# Patient Record
Sex: Male | Born: 1965 | Race: White | Hispanic: No | Marital: Married | Smoking: Never smoker
Health system: Southern US, Community
[De-identification: ages and names within clinical notes are randomized; demographics above are authoritative.]

## PROBLEM LIST (undated history)

## (undated) DIAGNOSIS — K7460 Unspecified cirrhosis of liver: Secondary | ICD-10-CM

## (undated) DIAGNOSIS — I8500 Esophageal varices without bleeding: Secondary | ICD-10-CM

## (undated) DIAGNOSIS — K7030 Alcoholic cirrhosis of liver without ascites: Secondary | ICD-10-CM

## (undated) DIAGNOSIS — G8929 Other chronic pain: Secondary | ICD-10-CM

## (undated) DIAGNOSIS — F319 Bipolar disorder, unspecified: Secondary | ICD-10-CM

## (undated) DIAGNOSIS — K3189 Other diseases of stomach and duodenum: Secondary | ICD-10-CM

## (undated) DIAGNOSIS — F32A Depression, unspecified: Secondary | ICD-10-CM

## (undated) HISTORY — PX: APPENDECTOMY: SHX54

## (undated) HISTORY — DX: Bipolar disorder, unspecified: F31.9

## (undated) HISTORY — DX: Other chronic pain: G89.29

## (undated) HISTORY — DX: Unspecified cirrhosis of liver: K74.60

## (undated) HISTORY — DX: Depression, unspecified: F32.A

---

## 2003-06-04 ENCOUNTER — Emergency Department (HOSPITAL_COMMUNITY): Admission: EM | Admit: 2003-06-04 | Discharge: 2003-06-04 | Payer: Self-pay

## 2006-11-08 ENCOUNTER — Encounter: Payer: Self-pay | Admitting: Internal Medicine

## 2006-11-18 ENCOUNTER — Ambulatory Visit (HOSPITAL_BASED_OUTPATIENT_CLINIC_OR_DEPARTMENT_OTHER): Admission: RE | Admit: 2006-11-18 | Discharge: 2006-11-18 | Payer: Self-pay | Admitting: *Deleted

## 2006-11-25 ENCOUNTER — Ambulatory Visit: Payer: Self-pay | Admitting: Internal Medicine

## 2007-12-03 ENCOUNTER — Ambulatory Visit: Payer: Self-pay | Admitting: Internal Medicine

## 2007-12-03 DIAGNOSIS — G4730 Sleep apnea, unspecified: Secondary | ICD-10-CM | POA: Insufficient documentation

## 2007-12-03 DIAGNOSIS — G4733 Obstructive sleep apnea (adult) (pediatric): Secondary | ICD-10-CM | POA: Insufficient documentation

## 2007-12-21 ENCOUNTER — Encounter: Payer: Self-pay | Admitting: Internal Medicine

## 2007-12-22 ENCOUNTER — Encounter: Payer: Self-pay | Admitting: Internal Medicine

## 2007-12-29 ENCOUNTER — Encounter: Payer: Self-pay | Admitting: Internal Medicine

## 2008-01-09 ENCOUNTER — Ambulatory Visit: Payer: Self-pay | Admitting: Internal Medicine

## 2008-06-23 ENCOUNTER — Ambulatory Visit: Payer: Self-pay | Admitting: Internal Medicine

## 2010-07-23 ENCOUNTER — Encounter: Payer: Self-pay | Admitting: Sports Medicine

## 2010-11-14 NOTE — Procedures (Signed)
NAMESAAFIR, Benjamin Knight              ACCOUNT NO.:  [id number]   MEDICAL RECORD NO.:  [id number]          PATIENT TYPE:  OUT   LOCATION:  SLEEP CENTER                 FACILITY:  [facility]   PHYSICIAN:  Clinton D. Maple Hudson, MD, FCCP, FACPDATE OF BIRTH:  Feb 15[birth year] 1965   DATE OF STUDY:  11/18/2006                            NOCTURNAL POLYSOMNOGRAM   REFERRING PHYSICIAN:  Donzetta Sprung   INDICATION FOR STUDY:  Insomnia with sleep apnea.   EPWORTH SLEEPINESS SCORE:  21/24.   BMI:  32.   WEIGHT:  218 pounds.   MEDICATIONS:  No medications listed.   SLEEP ARCHITECTURE:  Total sleep time 300 minutes with sleep efficiency  80%.  Stage I was 7%, stage II 76%, stages III and IV 6%.  REM 12% of  total sleep time.  Sleep latency 10 minutes.  REM latency 82 minutes.  Awake after sleep onset 20 minutes.  Arousable index 13.  No bedtime  medication taken.   RESPIRATORY DATA:  Apnea hypopnea index (AHI, RDI) 13.4 obstructive  events per hour indicating mild obstructive sleep apnea/hypopnea  syndrome.  There were 12 obstructive apneas and 55 hypopneas.  Events  were not positional.  REM, AHI 32 per hour.  There were insufficient  early events to qualify for CPAP titration by split protocol on this  study night.   OXYGEN DATA:  Mild snoring with oxygen desaturation to a nadir of 83%.  Main oxygen saturation through the study was 91% on room air.   CARDIAC DATA:  Normal sinus rhythm.   MOVEMENT-PARASOMNIA:  Rare limb jerk, insignificant.  Bathroom x 1.   IMPRESSIONS-RECOMMENDATIONS:  1. Mild obstructive sleep apnea/hypopnea syndrome, apnea hypopnea      index 13.4 per hour with none positional events, mild snoring, and      oxygen desaturation to a nadir of 84%.  2. There were insufficient events in the first half of the night to      qualify for continuous positive airway pressure titration by split      protocol on this study night.  Scores between 8 and 15 are      considered borderline as  indicators      for continuous positive airway pressure therapy, which may be      justified if more conservative measures are ineffective.      Clinton D. Maple Hudson, MD, [facility], FACP  Diplomate, Biomedical engineer of Sleep Medicine  Electronically Signed     CDY/MEDQ  D:  11/25/2006 09:03:45  T:  11/25/2006 04:54:09  Job:  [id number]

## 2016-07-18 DIAGNOSIS — M79672 Pain in left foot: Secondary | ICD-10-CM | POA: Diagnosis not present

## 2016-11-02 DIAGNOSIS — K900 Celiac disease: Secondary | ICD-10-CM | POA: Diagnosis not present

## 2016-11-19 ENCOUNTER — Ambulatory Visit
Admission: RE | Admit: 2016-11-19 | Discharge: 2016-11-19 | Disposition: A | Discharge status: Home / Self Care | Payer: 59 | Source: Ambulatory Visit | Attending: Student | Admitting: Student

## 2016-11-19 ENCOUNTER — Other Ambulatory Visit: Payer: Self-pay | Admitting: Student

## 2016-11-19 DIAGNOSIS — M79661 Pain in right lower leg: Secondary | ICD-10-CM

## 2016-11-19 DIAGNOSIS — L03115 Cellulitis of right lower limb: Secondary | ICD-10-CM | POA: Diagnosis not present

## 2017-02-22 DIAGNOSIS — Z0000 Encounter for general adult medical examination without abnormal findings: Secondary | ICD-10-CM | POA: Diagnosis not present

## 2017-03-01 DIAGNOSIS — Z0001 Encounter for general adult medical examination with abnormal findings: Secondary | ICD-10-CM | POA: Diagnosis not present

## 2017-04-29 DIAGNOSIS — L03315 Cellulitis of perineum: Secondary | ICD-10-CM | POA: Diagnosis not present

## 2017-05-03 DIAGNOSIS — L03315 Cellulitis of perineum: Secondary | ICD-10-CM | POA: Diagnosis not present

## 2018-02-28 DIAGNOSIS — Z0001 Encounter for general adult medical examination with abnormal findings: Secondary | ICD-10-CM | POA: Diagnosis not present

## 2018-03-07 DIAGNOSIS — Z0001 Encounter for general adult medical examination with abnormal findings: Secondary | ICD-10-CM | POA: Diagnosis not present

## 2018-03-07 DIAGNOSIS — Z6830 Body mass index (BMI) 30.0-30.9, adult: Secondary | ICD-10-CM | POA: Diagnosis not present

## 2020-01-07 DIAGNOSIS — Z6829 Body mass index (BMI) 29.0-29.9, adult: Secondary | ICD-10-CM | POA: Diagnosis not present

## 2020-01-07 DIAGNOSIS — K2100 Gastro-esophageal reflux disease with esophagitis, without bleeding: Secondary | ICD-10-CM | POA: Diagnosis not present

## 2020-01-07 DIAGNOSIS — F331 Major depressive disorder, recurrent, moderate: Secondary | ICD-10-CM | POA: Diagnosis not present

## 2020-02-25 DIAGNOSIS — F331 Major depressive disorder, recurrent, moderate: Secondary | ICD-10-CM | POA: Diagnosis not present

## 2020-02-25 DIAGNOSIS — G4733 Obstructive sleep apnea (adult) (pediatric): Secondary | ICD-10-CM | POA: Diagnosis not present

## 2020-02-25 DIAGNOSIS — Z6829 Body mass index (BMI) 29.0-29.9, adult: Secondary | ICD-10-CM | POA: Diagnosis not present

## 2020-02-25 DIAGNOSIS — K2100 Gastro-esophageal reflux disease with esophagitis, without bleeding: Secondary | ICD-10-CM | POA: Diagnosis not present

## 2020-05-20 DIAGNOSIS — M7989 Other specified soft tissue disorders: Secondary | ICD-10-CM | POA: Diagnosis not present

## 2020-05-20 DIAGNOSIS — J302 Other seasonal allergic rhinitis: Secondary | ICD-10-CM | POA: Diagnosis not present

## 2020-05-20 DIAGNOSIS — R251 Tremor, unspecified: Secondary | ICD-10-CM | POA: Diagnosis not present

## 2020-07-08 DIAGNOSIS — Z13220 Encounter for screening for lipoid disorders: Secondary | ICD-10-CM | POA: Diagnosis not present

## 2020-07-08 DIAGNOSIS — Z7185 Encounter for immunization safety counseling: Secondary | ICD-10-CM | POA: Diagnosis not present

## 2020-07-08 DIAGNOSIS — Z1211 Encounter for screening for malignant neoplasm of colon: Secondary | ICD-10-CM | POA: Diagnosis not present

## 2020-07-08 DIAGNOSIS — E559 Vitamin D deficiency, unspecified: Secondary | ICD-10-CM | POA: Diagnosis not present

## 2020-07-08 DIAGNOSIS — Z0000 Encounter for general adult medical examination without abnormal findings: Secondary | ICD-10-CM | POA: Diagnosis not present

## 2020-07-08 DIAGNOSIS — Z125 Encounter for screening for malignant neoplasm of prostate: Secondary | ICD-10-CM | POA: Diagnosis not present

## 2020-07-08 DIAGNOSIS — F419 Anxiety disorder, unspecified: Secondary | ICD-10-CM | POA: Diagnosis not present

## 2020-07-08 DIAGNOSIS — R748 Abnormal levels of other serum enzymes: Secondary | ICD-10-CM | POA: Diagnosis not present

## 2020-07-08 DIAGNOSIS — K900 Celiac disease: Secondary | ICD-10-CM | POA: Diagnosis not present

## 2020-07-15 DIAGNOSIS — Z20822 Contact with and (suspected) exposure to covid-19: Secondary | ICD-10-CM | POA: Diagnosis not present

## 2020-07-19 DIAGNOSIS — N3000 Acute cystitis without hematuria: Secondary | ICD-10-CM | POA: Diagnosis not present

## 2020-09-02 DIAGNOSIS — F419 Anxiety disorder, unspecified: Secondary | ICD-10-CM | POA: Diagnosis not present

## 2020-09-23 DIAGNOSIS — Z1379 Encounter for other screening for genetic and chromosomal anomalies: Secondary | ICD-10-CM | POA: Insufficient documentation

## 2020-09-28 DIAGNOSIS — Z01818 Encounter for other preprocedural examination: Secondary | ICD-10-CM | POA: Diagnosis not present

## 2020-09-28 DIAGNOSIS — K900 Celiac disease: Secondary | ICD-10-CM | POA: Diagnosis not present

## 2020-10-13 DIAGNOSIS — R768 Other specified abnormal immunological findings in serum: Secondary | ICD-10-CM | POA: Diagnosis not present

## 2020-10-13 DIAGNOSIS — D124 Benign neoplasm of descending colon: Secondary | ICD-10-CM | POA: Diagnosis not present

## 2020-10-13 DIAGNOSIS — K2970 Gastritis, unspecified, without bleeding: Secondary | ICD-10-CM | POA: Diagnosis not present

## 2020-10-13 DIAGNOSIS — K3189 Other diseases of stomach and duodenum: Secondary | ICD-10-CM | POA: Diagnosis not present

## 2020-10-13 DIAGNOSIS — Z1211 Encounter for screening for malignant neoplasm of colon: Secondary | ICD-10-CM | POA: Diagnosis not present

## 2020-10-13 DIAGNOSIS — K900 Celiac disease: Secondary | ICD-10-CM | POA: Diagnosis not present

## 2021-02-17 DIAGNOSIS — Z20822 Contact with and (suspected) exposure to covid-19: Secondary | ICD-10-CM | POA: Diagnosis not present

## 2021-03-17 DIAGNOSIS — G4700 Insomnia, unspecified: Secondary | ICD-10-CM | POA: Diagnosis not present

## 2021-03-17 DIAGNOSIS — F3181 Bipolar II disorder: Secondary | ICD-10-CM | POA: Diagnosis not present

## 2021-03-17 DIAGNOSIS — F1099 Alcohol use, unspecified with unspecified alcohol-induced disorder: Secondary | ICD-10-CM | POA: Diagnosis not present

## 2021-03-29 DIAGNOSIS — F10982 Alcohol use, unspecified with alcohol-induced sleep disorder: Secondary | ICD-10-CM | POA: Diagnosis not present

## 2021-03-29 DIAGNOSIS — M9903 Segmental and somatic dysfunction of lumbar region: Secondary | ICD-10-CM | POA: Diagnosis not present

## 2021-03-29 DIAGNOSIS — F3181 Bipolar II disorder: Secondary | ICD-10-CM | POA: Diagnosis not present

## 2021-03-29 DIAGNOSIS — M9902 Segmental and somatic dysfunction of thoracic region: Secondary | ICD-10-CM | POA: Diagnosis not present

## 2021-03-29 DIAGNOSIS — M62830 Muscle spasm of back: Secondary | ICD-10-CM | POA: Diagnosis not present

## 2021-03-29 DIAGNOSIS — R251 Tremor, unspecified: Secondary | ICD-10-CM | POA: Diagnosis not present

## 2021-03-29 DIAGNOSIS — M546 Pain in thoracic spine: Secondary | ICD-10-CM | POA: Diagnosis not present

## 2021-03-30 ENCOUNTER — Inpatient Hospital Stay (HOSPITAL_COMMUNITY)
Admission: EM | Admit: 2021-03-30 | Discharge: 2021-04-02 | DRG: 433 | Disposition: A | Discharge status: Home / Self Care | Payer: BC Managed Care – PPO | Attending: Internal Medicine | Admitting: Internal Medicine

## 2021-03-30 ENCOUNTER — Observation Stay (HOSPITAL_BASED_OUTPATIENT_CLINIC_OR_DEPARTMENT_OTHER): Payer: BC Managed Care – PPO

## 2021-03-30 ENCOUNTER — Emergency Department (HOSPITAL_COMMUNITY): Payer: BC Managed Care – PPO

## 2021-03-30 ENCOUNTER — Observation Stay (HOSPITAL_COMMUNITY): Payer: BC Managed Care – PPO

## 2021-03-30 ENCOUNTER — Encounter (HOSPITAL_COMMUNITY): Payer: Self-pay | Admitting: Emergency Medicine

## 2021-03-30 DIAGNOSIS — R791 Abnormal coagulation profile: Secondary | ICD-10-CM | POA: Diagnosis not present

## 2021-03-30 DIAGNOSIS — Z79899 Other long term (current) drug therapy: Secondary | ICD-10-CM

## 2021-03-30 DIAGNOSIS — Z20822 Contact with and (suspected) exposure to covid-19: Secondary | ICD-10-CM | POA: Diagnosis not present

## 2021-03-30 DIAGNOSIS — M549 Dorsalgia, unspecified: Secondary | ICD-10-CM | POA: Diagnosis not present

## 2021-03-30 DIAGNOSIS — R823 Hemoglobinuria: Secondary | ICD-10-CM | POA: Diagnosis present

## 2021-03-30 DIAGNOSIS — R29818 Other symptoms and signs involving the nervous system: Secondary | ICD-10-CM | POA: Diagnosis not present

## 2021-03-30 DIAGNOSIS — R278 Other lack of coordination: Secondary | ICD-10-CM | POA: Diagnosis present

## 2021-03-30 DIAGNOSIS — F1090 Alcohol use, unspecified, uncomplicated: Secondary | ICD-10-CM | POA: Diagnosis present

## 2021-03-30 DIAGNOSIS — I428 Other cardiomyopathies: Secondary | ICD-10-CM

## 2021-03-30 DIAGNOSIS — Z6823 Body mass index (BMI) 23.0-23.9, adult: Secondary | ICD-10-CM | POA: Diagnosis not present

## 2021-03-30 DIAGNOSIS — R9431 Abnormal electrocardiogram [ECG] [EKG]: Secondary | ICD-10-CM | POA: Diagnosis not present

## 2021-03-30 DIAGNOSIS — G4730 Sleep apnea, unspecified: Secondary | ICD-10-CM | POA: Diagnosis present

## 2021-03-30 DIAGNOSIS — G8929 Other chronic pain: Secondary | ICD-10-CM | POA: Diagnosis not present

## 2021-03-30 DIAGNOSIS — I444 Left anterior fascicular block: Secondary | ICD-10-CM | POA: Diagnosis present

## 2021-03-30 DIAGNOSIS — K7040 Alcoholic hepatic failure without coma: Secondary | ICD-10-CM | POA: Diagnosis not present

## 2021-03-30 DIAGNOSIS — F17220 Nicotine dependence, chewing tobacco, uncomplicated: Secondary | ICD-10-CM | POA: Diagnosis not present

## 2021-03-30 DIAGNOSIS — G9340 Encephalopathy, unspecified: Secondary | ICD-10-CM | POA: Diagnosis not present

## 2021-03-30 DIAGNOSIS — F319 Bipolar disorder, unspecified: Secondary | ICD-10-CM | POA: Diagnosis not present

## 2021-03-30 DIAGNOSIS — D649 Anemia, unspecified: Secondary | ICD-10-CM | POA: Diagnosis present

## 2021-03-30 DIAGNOSIS — Y900 Blood alcohol level of less than 20 mg/100 ml: Secondary | ICD-10-CM | POA: Diagnosis present

## 2021-03-30 DIAGNOSIS — R4182 Altered mental status, unspecified: Secondary | ICD-10-CM | POA: Diagnosis present

## 2021-03-30 DIAGNOSIS — F10288 Alcohol dependence with other alcohol-induced disorder: Secondary | ICD-10-CM | POA: Diagnosis not present

## 2021-03-30 DIAGNOSIS — R634 Abnormal weight loss: Secondary | ICD-10-CM | POA: Diagnosis not present

## 2021-03-30 DIAGNOSIS — K7682 Hepatic encephalopathy: Secondary | ICD-10-CM

## 2021-03-30 DIAGNOSIS — I959 Hypotension, unspecified: Secondary | ICD-10-CM | POA: Diagnosis not present

## 2021-03-30 DIAGNOSIS — K7030 Alcoholic cirrhosis of liver without ascites: Secondary | ICD-10-CM | POA: Diagnosis present

## 2021-03-30 DIAGNOSIS — K8020 Calculus of gallbladder without cholecystitis without obstruction: Secondary | ICD-10-CM | POA: Diagnosis not present

## 2021-03-30 DIAGNOSIS — K900 Celiac disease: Secondary | ICD-10-CM | POA: Diagnosis present

## 2021-03-30 LAB — CBC WITH DIFFERENTIAL/PLATELET
Abs Immature Granulocytes: 0.02 10*3/uL (ref 0.00–0.07)
Basophils Absolute: 0.1 10*3/uL (ref 0.0–0.1)
Basophils Relative: 2 %
Eosinophils Absolute: 0.3 10*3/uL (ref 0.0–0.5)
Eosinophils Relative: 3 %
HCT: 36.5 % — ABNORMAL LOW (ref 39.0–52.0)
Hemoglobin: 11.4 g/dL — ABNORMAL LOW (ref 13.0–17.0)
Immature Granulocytes: 0 %
Lymphocytes Relative: 22 %
Lymphs Abs: 1.7 10*3/uL (ref 0.7–4.0)
MCH: 26.6 pg (ref 26.0–34.0)
MCHC: 31.2 g/dL (ref 30.0–36.0)
MCV: 85.3 fL (ref 80.0–100.0)
Monocytes Absolute: 1.9 10*3/uL — ABNORMAL HIGH (ref 0.1–1.0)
Monocytes Relative: 23 %
Neutro Abs: 4.1 10*3/uL (ref 1.7–7.7)
Neutrophils Relative %: 50 %
Platelets: 210 10*3/uL (ref 150–400)
RBC: 4.28 MIL/uL (ref 4.22–5.81)
RDW: 19.1 % — ABNORMAL HIGH (ref 11.5–15.5)
WBC: 8.1 10*3/uL (ref 4.0–10.5)
nRBC: 0 % (ref 0.0–0.2)

## 2021-03-30 LAB — COMPREHENSIVE METABOLIC PANEL WITH GFR
ALT: 23 U/L (ref 0–44)
AST: 79 U/L — ABNORMAL HIGH (ref 15–41)
Albumin: 3 g/dL — ABNORMAL LOW (ref 3.5–5.0)
Alkaline Phosphatase: 158 U/L — ABNORMAL HIGH (ref 38–126)
Anion gap: 9 (ref 5–15)
BUN: 11 mg/dL (ref 6–20)
CO2: 23 mmol/L (ref 22–32)
Calcium: 9.1 mg/dL (ref 8.9–10.3)
Chloride: 107 mmol/L (ref 98–111)
Creatinine, Ser: 0.82 mg/dL (ref 0.61–1.24)
GFR, Estimated: >60 mL/min
Glucose, Bld: 135 mg/dL — ABNORMAL HIGH (ref 70–99)
Potassium: 4.3 mmol/L (ref 3.5–5.1)
Sodium: 139 mmol/L (ref 135–145)
Total Bilirubin: 1.9 mg/dL — ABNORMAL HIGH (ref 0.3–1.2)
Total Protein: 7.1 g/dL (ref 6.5–8.1)

## 2021-03-30 LAB — I-STAT CHEM 8, ED
BUN: 14 mg/dL (ref 6–20)
Calcium, Ion: 1.17 mmol/L (ref 1.15–1.40)
Chloride: 106 mmol/L (ref 98–111)
Creatinine, Ser: 0.8 mg/dL (ref 0.61–1.24)
Glucose, Bld: 129 mg/dL — ABNORMAL HIGH (ref 70–99)
HCT: 34 % — ABNORMAL LOW (ref 39.0–52.0)
Hemoglobin: 11.6 g/dL — ABNORMAL LOW (ref 13.0–17.0)
Potassium: 4.3 mmol/L (ref 3.5–5.1)
Sodium: 141 mmol/L (ref 135–145)
TCO2: 25 mmol/L (ref 22–32)

## 2021-03-30 LAB — ECHOCARDIOGRAM COMPLETE
AR max vel: 2.63 cm2
AV Area VTI: 2.23 cm2
AV Area mean vel: 3.16 cm2
AV Mean grad: 7 mmHg
AV Peak grad: 12.7 mmHg
Ao pk vel: 1.78 m/s
Area-P 1/2: 2.42 cm2
Calc EF: 57.8 %
S' Lateral: 3.2 cm
Single Plane A2C EF: 63.1 %
Single Plane A4C EF: 57 %

## 2021-03-30 LAB — URINALYSIS, ROUTINE W REFLEX MICROSCOPIC
Bacteria, UA: NONE SEEN
Bilirubin Urine: NEGATIVE
Glucose, UA: NEGATIVE mg/dL
Ketones, ur: NEGATIVE mg/dL
Leukocytes,Ua: NEGATIVE
Nitrite: NEGATIVE
Protein, ur: NEGATIVE mg/dL
Specific Gravity, Urine: 1.01 (ref 1.005–1.030)
pH: 7 (ref 5.0–8.0)

## 2021-03-30 LAB — CBG MONITORING, ED: Glucose-Capillary: 127 mg/dL — ABNORMAL HIGH (ref 70–99)

## 2021-03-30 LAB — ETHANOL: Alcohol, Ethyl (B): <10 mg/dL

## 2021-03-30 LAB — RAPID URINE DRUG SCREEN, HOSP PERFORMED
Amphetamines: NOT DETECTED
Barbiturates: NOT DETECTED
Benzodiazepines: NOT DETECTED
Cocaine: NOT DETECTED
Opiates: NOT DETECTED
Tetrahydrocannabinol: NOT DETECTED

## 2021-03-30 LAB — ACETAMINOPHEN LEVEL: Acetaminophen (Tylenol), Serum: <10 ug/mL — ABNORMAL LOW (ref 10–30)

## 2021-03-30 LAB — SALICYLATE LEVEL: Salicylate Lvl: <7 mg/dL — ABNORMAL LOW (ref 7.0–30.0)

## 2021-03-30 LAB — AMMONIA: Ammonia: 93 umol/L — ABNORMAL HIGH (ref 9–35)

## 2021-03-30 LAB — LACTIC ACID, PLASMA: Lactic Acid, Venous: 1.6 mmol/L (ref 0.5–1.9)

## 2021-03-30 LAB — VALPROIC ACID LEVEL: Valproic Acid Lvl: 48 ug/mL — ABNORMAL LOW (ref 50.0–100.0)

## 2021-03-30 LAB — RESP PANEL BY RT-PCR (FLU A&B, COVID) ARPGX2
Influenza A by PCR: NEGATIVE
Influenza B by PCR: NEGATIVE
SARS Coronavirus 2 by RT PCR: NEGATIVE

## 2021-03-30 MED ORDER — RIVAROXABAN 10 MG PO TABS
10.0000 mg | ORAL_TABLET | Freq: Every day | ORAL | Status: DC
  Administered 2021-03-30 – 2021-04-02 (×4): 10 mg via ORAL
  Filled 2021-03-30 (×4): qty 1

## 2021-03-30 MED ORDER — PANTOPRAZOLE SODIUM 40 MG PO TBEC
40.0000 mg | DELAYED_RELEASE_TABLET | Freq: Every day | ORAL | Status: DC
  Administered 2021-03-30 – 2021-04-02 (×4): 40 mg via ORAL
  Filled 2021-03-30 (×4): qty 1

## 2021-03-30 MED ORDER — LACTULOSE 10 GM/15ML PO SOLN
30.0000 g | Freq: Once | ORAL | Status: AC
  Administered 2021-03-30: 30 g via ORAL
  Filled 2021-03-30: qty 45

## 2021-03-30 MED ORDER — DIVALPROEX SODIUM 250 MG PO DR TAB
500.0000 mg | DELAYED_RELEASE_TABLET | Freq: Every day | ORAL | Status: DC
  Administered 2021-03-30: 500 mg via ORAL
  Filled 2021-03-30: qty 2

## 2021-03-30 MED ORDER — SODIUM CHLORIDE 0.9% FLUSH
3.0000 mL | Freq: Two times a day (BID) | INTRAVENOUS | Status: DC
  Administered 2021-03-30 – 2021-04-02 (×7): 3 mL via INTRAVENOUS

## 2021-03-30 MED ORDER — FUROSEMIDE 10 MG/ML IJ SOLN
40.0000 mg | Freq: Once | INTRAMUSCULAR | Status: AC
  Administered 2021-03-30: 40 mg via INTRAVENOUS
  Filled 2021-03-30: qty 4

## 2021-03-30 MED ORDER — ESCITALOPRAM OXALATE 10 MG PO TABS
5.0000 mg | ORAL_TABLET | Freq: Every morning | ORAL | Status: DC
  Administered 2021-03-31 – 2021-04-02 (×3): 5 mg via ORAL
  Filled 2021-03-30 (×3): qty 1

## 2021-03-30 MED ORDER — IBUPROFEN 400 MG PO TABS: 400.0000 mg | ORAL_TABLET | Freq: Four times a day (QID) | ORAL | Status: DC | PRN

## 2021-03-30 MED ORDER — PROPRANOLOL HCL 10 MG PO TABS
10.0000 mg | ORAL_TABLET | Freq: Two times a day (BID) | ORAL | Status: DC
  Administered 2021-03-30 – 2021-04-01 (×3): 10 mg via ORAL
  Filled 2021-03-30 (×7): qty 1

## 2021-03-30 MED ORDER — LACTULOSE 10 GM/15ML PO SOLN
30.0000 g | Freq: Three times a day (TID) | ORAL | Status: DC
  Administered 2021-03-30 – 2021-04-01 (×6): 30 g via ORAL
  Filled 2021-03-30 (×2): qty 45
  Filled 2021-03-30: qty 60
  Filled 2021-03-30: qty 45
  Filled 2021-03-30: qty 60
  Filled 2021-03-30 (×3): qty 45

## 2021-03-30 NOTE — ED Provider Notes (Addendum)
[facility] EMERGENCY DEPARTMENT Provider Note   CSN: [id number] Arrival date & time: 03/30/21  0755     History Chief Complaint  Patient presents with   Altered Mental Status    Benjamin Knight is a 55 y.o. male with PMH significant for bipolar disorder who was recently started on depakote who was brought in by his wife for AMS. Wife reports he started acting not himself at around 8pm last night, but became more decidedly altered this morning.  Reports that he believes that he started having mood changes around 6 months ago.  Initially she thought the mood changes were secondary to some sort of "midlife crisis" however patient went to see psychiatrist a few weeks before Labor Day, was diagnosed with bipolar disorder.  Patient was started on Depakote within the last month, as well as escitalopram around the same time.  Wife endorses that patient has been having tremors, feeling cold consistently for several months, prior to beginning these new medications.  His wife does endorse that he was drinking heavily, perhaps 1/5 a day before she confronted him around Labor Day and encouraged him to quit.  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  Patient has not had withdrawal from alcohol previously, history of seizures with alcohol.  His wife does endorse that he had some suicidal ideation around a month ago, however he denies overdose on medication alcohol today.  Patient's odd behavior has been noticed at work and he has been flat affect, and unengaged for the last month.  Acute worsening of mental status changes this morning, patient was found trying to discharge his truck with no keys, having cracked some eggs onto the floor, and attempted to urinate into the sink.  His wife also endorses 40 pound weight loss in the last year.  Level 5 caveat: altered mental status   Altered Mental Status     History reviewed. No pertinent past medical history.  Patient  Active Problem List   Diagnosis Date Noted   AMS (altered mental status) 03/30/2021   SLEEP APNEA 12/03/2007    History reviewed. No pertinent surgical history.     No family history on file.     Home Medications Prior to Admission medications   Medication Sig Start Date End Date Taking? Authorizing Provider  divalproex (DEPAKOTE) 500 MG DR tablet Take 500 mg by mouth daily after supper. 03/17/21  Yes [provider]  escitalopram (LEXAPRO) 10 MG tablet Take 5 mg by mouth in the morning. 02/08/21  Yes [provider]  PEPCID 20 MG tablet Take 10-20 mg by mouth 2 (two) times daily as needed for heartburn or indigestion.   Yes [provider]  propranolol (INDERAL) 10 MG tablet Take 10 mg by mouth 2 (two) times daily. 03/29/21  Yes [provider]    Allergies    Patient has no known allergies.  Review of Systems   Review of Systems  Reason unable to perform ROS: AMS.   Physical Exam Updated Vital Signs BP 121/79   Pulse 71   Temp (!) 97.3 F (36.3 C) (Oral)   Resp 13   SpO2 95%   Physical Exam Vitals and nursing note reviewed.  Constitutional:      General: He is not in acute distress.    Appearance: Normal appearance.  HENT:     Head: Normocephalic and atraumatic.  Eyes:     General: Scleral icterus present.          Right eye: No discharge.        Left eye: No discharge.     Comments: Mild scleral icterus  Cardiovascular:     Rate and Rhythm: Normal rate and regular rhythm.     Heart sounds: No murmur heard.   No friction rub. No gallop.  Pulmonary:     Effort: Pulmonary effort is normal.     Breath sounds: Normal breath sounds.  Abdominal:     General: Bowel sounds are normal.     Palpations: Abdomen is soft.     Comments: Does not respond to abdominal palpation, denies pain when asked.  No masses were palpated.  Bowel sounds are normal  Skin:    General: Skin is warm and dry.     Capillary Refill: Capillary refill takes  less than 2 seconds.     Comments: Bilateral lower extremity pitting edema 1-2+, symmetrical.  No evidence of ulceration, or infection.  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  Neurological:     Mental Status: He is alert.     Comments: Patient is oriented to self, and place, not oriented to date. Cranial nerves III through XII grossly intact.  5 out of 5 strength in upper and lower extremities, symmetrical.  Intact finger-to-nose with some tremor.  Patient has difficulty with memory, could not describe the events of this morning.  Patient additionally was unable to count backwards from 100 by sevens.  Psychiatric:     Comments: Thought content abnormal, patient confused and somnolent    ED Results / Procedures / Treatments   Labs (all labs ordered are listed, but only abnormal results are displayed) Labs Reviewed  CBC WITH DIFFERENTIAL/PLATELET - Abnormal; Notable for the following components:      Result Value   Hemoglobin 11.4 (*)    HCT 36.5 (*)    RDW 19.1 (*)    Monocytes Absolute 1.9 (*)    All other components within normal limits  COMPREHENSIVE METABOLIC PANEL - Abnormal; Notable for the following components:   Glucose, Bld 135 (*)    Albumin 3.0 (*)    AST 79 (*)    Alkaline Phosphatase 158 (*)    Total Bilirubin 1.9 (*)    All other components within normal limits  URINALYSIS, ROUTINE W REFLEX MICROSCOPIC - Abnormal; Notable for the following components:   Hgb urine dipstick MODERATE (*)    Non Squamous Epithelial 0-5 (*)    All other components within normal limits  AMMONIA - Abnormal; Notable for the following components:   Ammonia 93 (*)    All other components within normal limits  VALPROIC ACID LEVEL - Abnormal; Notable for the following components:   Valproic Acid Lvl 48 (*)    All other components within normal limits  ACETAMINOPHEN LEVEL - Abnormal; Notable for the following components:    Acetaminophen (Tylenol), Serum <10 (*)    All other components within normal limits  SALICYLATE LEVEL - Abnormal; Notable for the following components:   Salicylate Lvl <7.0 (*)    All other components within normal limits  CBG MONITORING, ED - Abnormal; Notable for the following components:   Glucose-Capillary 127 (*)    All other components within normal limits  I-STAT CHEM 8, ED - Abnormal; Notable for the following components:   Glucose, Bld 129 (*)    Hemoglobin 11.6 (*)    HCT 34.0 (*)    All other components  within normal limits  RESP PANEL BY RT-PCR (FLU A&B, COVID) ARPGX2  LACTIC ACID, PLASMA  ETHANOL  RAPID URINE DRUG SCREEN, HOSP PERFORMED  BILIRUBIN, FRACTIONATED(TOT/DIR/INDIR)    EKG EKG Interpretation  Date/Time:  Thursday March 30 2021 07:56:25 EDT Ventricular Rate:  69 PR Interval:  130 QRS Duration: 94 QT Interval:  424 QTC Calculation: 454 R Axis:   -52 Text Interpretation: Normal sinus rhythm Left anterior fascicular block Abnormal ECG No significant change since last tracing Confirmed by Schlossman, Erin ([id number]) on 03/30/2021 9:03:04 AM  Radiology DG Chest 2 View  Result Date: 03/30/2021 CLINICAL DATA:  Altered mental status EXAM: CHEST - 2 VIEW COMPARISON:  None. FINDINGS: Criteria and mediastinal contours within normal limits for AP technique. Lungs are clear. Pleural effusion or pneumothorax. IMPRESSION: No active cardiopulmonary disease. Electronically Signed   By: Leah  Strickland M.D.   On: 03/30/2021 08:58   CT Head Wo Contrast  Result Date: 03/30/2021 CLINICAL DATA:  Mental status changes EXAM: CT HEAD WITHOUT CONTRAST TECHNIQUE: Contiguous axial images were obtained from the base of the skull through the vertex without intravenous contrast. COMPARISON:  None. FINDINGS: Brain: No acute intracranial abnormality. Specifically, no hemorrhage, hydrocephalus, mass lesion, acute infarction, or significant intracranial injury. Vascular: No hyperdense  vessel or unexpected calcification. Skull: No acute calvarial abnormality. Sinuses/Orbits: No acute findings Other: None IMPRESSION: No acute intracranial abnormality. Electronically Signed   By: Kevin  Dover M.D.   On: 03/30/2021 09:21    Procedures Procedures   Medications Ordered in ED Medications  rivaroxaban (XARELTO) tablet 10 mg (has no administration in time range)  sodium chloride flush (NS) 0.9 % injection 3 mL (has no administration in time range)  ibuprofen (ADVIL) tablet 400 mg (has no administration in time range)  lactulose (CHRONULAC) 10 GM/15ML solution 30 g (30 g Oral Given 03/30/21 1018)    ED Course  I have reviewed the triage vital signs and the nursing notes.  Pertinent labs & imaging results that were available during my care of the patient were reviewed by me and considered in my medical decision making (see chart for details).    MDM Rules/Calculators/A&P                         Lab work-up is remarkable for normal head CT, normal chest x-ray, and EKG.  Patient has an ammonia level of 93, slightly elevated alk phos and T bili as well as AST.  CBG is normal.  At this time we will order valproic acid level, as well as other toxic ingestion labs.  We will obtain an ultrasound of the liver.  We will begin lactulose.  Patient with neurologic changes consistent with hepatic encephalopathy versus Depakote toxicity.  Will consult with hospitalist for admission.  Pending ingestion labs secondary to report that patient has been struggling with some suicidal ideation last month.  No remarkable EKG changes.  Patient currently protecting his airway.  Hospitalist agrees to admission.  Patient stable at time of handoff.  Worsening of mental status throughout hospital course so far, patient was oriented to date during triage.  Final Clinical Impression(s) / ED Diagnoses Final diagnoses:  Altered mental status    Rx / DC Orders ED Discharge Orders     None         Prosperi, Christian H, PA-C 03/30/21 1224    Prosperi, Christian H, PA-C 03/30/21 1244    Schlossman, Erin, MD 03/30/21 1254  

## 2021-03-30 NOTE — ED Triage Notes (Signed)
Pt arrives with wife who reports pt has been altered since last night around 8 pm. Pt able to answer orientation questions. Wife reports he peed in the sink this morning. States he was recently started on Depakote for possible bipolar.

## 2021-03-30 NOTE — ED Notes (Signed)
Pt remains in MRI 

## 2021-03-30 NOTE — Progress Notes (Signed)
2D echocardiogram completed.  03/30/2021 2:12 PM Kelby Aline., MHA, RVT, RDCS, RDMS

## 2021-03-30 NOTE — ED Provider Notes (Signed)
Emergency Medicine Provider Triage Evaluation Note  Benjamin Knight , a 55 y.o. male  was evaluated in triage.  Pt complains of altered mental status, onset 8PM last night, wife initially thought it was due to medications (Depakote) but this morning noticed definite change. States patient urinated in the sink this morning, answers to questions does not make sense.   Review of Systems  Positive: Altered mental status Negative:   Physical Exam  There were no vitals taken for this visit. Gen:   Awake, no distress   Resp:  Normal effort  MSK:   Moves extremities without difficulty  Other:  Alert to person, place, day. Follows commands, no unilateral weakness  Medical Decision Making  Medically screening exam initiated at 7:59 AM.  Appropriate orders placed.  DANYAL WHITENACK was informed that the remainder of the evaluation will be completed by another provider, this initial triage assessment does not replace that evaluation, and the importance of remaining in the ED until their evaluation is complete.     Tacy Learn, PA-C 03/30/21 9432    Lucrezia Starch, MD 03/30/21 [phone]

## 2021-03-30 NOTE — H&P (Signed)
Date: 03/30/2021               Patient Name:  Benjamin Knight MRN: [id number]  DOB: 06-Nov-1965 Age / Sex: 55 y.o., male   PCP: Johna Roles, PA         Medical Service: Internal Medicine Teaching Service         Attending Physician: Dr. Aldine Contes, MD    First Contact: Christiana Fuchs, DO Pager: FY [phone]  Second Contact: Hadassah Pais, MD Pager: Rudean Curt [phone]       After Hours (After 5p/  First Contact Pager: [phone]  weekends / holidays): Second Contact Pager: [phone]   SUBJECTIVE  Chief Complaint: altered mental status  History of Present Illness: Benjamin Knight is a 55 y.o. male with a pertinent PMH of depression and bipolar disorder, and chronic back pain who presents to [facility] with altered mental status. History obtained by patient's wife and chart review.  Mr Vanessa Alesi has had mood and behavior changes for several months now. Per wife, this was initially thought to be secondary to "mid life crisis"; however, this continued on until family, friends and coworkers started noticing disinhibited behaviors. Patient started seeing psychiatrist few weeks prior to Labor day and was diagnosed with bipolar disorder. He was started on depakote and escitalopram. She notes that since then, he has had a more flat affect with inappropriate responses. Yesterday evening, patient noted to not be responding to conversation with his wife appropriately. However, he notes that he just decided to watch TV and went to sleep. This morning, when he awoke, he was getting ready for work when wife noted that he was having trouble with making breakfast. She noted multiple cracked eggs on the floor with the patient exclaiming "I can't get the eggs out of the pan". He then proceeded to try to start his truck; however, did not have keys. Per wife's encouragement, patient agreed to present to the hospital for further evaluation; however, as he was getting ready, he attempted to urinate in the sink.   Prior to current events, wife reports that he had been having worsening back and neck pain for some time. However, denies any headaches, vision changes, lightheadedness/dizziness, fevers/chills, urinary symptoms, abdominal pain, nausea/vomiting or diarrhea.   Medications: No current facility-administered medications on file prior to encounter.   Current Outpatient Medications on File Prior to Encounter  Medication Sig Dispense Refill   divalproex (DEPAKOTE) 500 MG DR tablet Take 500 mg by mouth daily after supper.     escitalopram (LEXAPRO) 10 MG tablet Take 5 mg by mouth in the morning.     PEPCID 20 MG tablet Take 10-20 mg by mouth 2 (two) times daily as needed for heartburn or indigestion.     propranolol (INDERAL) 10 MG tablet Take 10 mg by mouth 2 (two) times daily.      Past Medical History:  Bipolar disorder Depression Chronic back pain  Social:  Patient lives at home with his wife. He works in [employer], [employer]. Per wife, he has an extensive history of alcohol use with prior attempts of quitting. However, recently noted to be drinking up to 1/5 liquor on weekends and half of 1/5 liquor daily on weekdays until Labor day weekend. He chews tobacco daily since age 54. Denies any illicit substance use.   Family History: Unable to clarify family history due to AMS.   Allergies: Allergies as of 03/30/2021   (No Known Allergies)    Review of Systems: A  complete ROS was negative except as per HPI.   OBJECTIVE:  Physical Exam: Blood pressure 114/76, pulse 69, temperature (!) 97.3 F (36.3 C), temperature source Oral, resp. rate 13, SpO2 100 %. Physical Exam Constitutional:      Appearance: Normal appearance. He is not diaphoretic.  HENT:     Head: Normocephalic and atraumatic.     Mouth/Throat:     Mouth: Mucous membranes are moist.     Pharynx: Oropharynx is clear.  Eyes:     General: No scleral icterus.    Extraocular Movements: Extraocular movements intact.     Pupils: Pupils are  equal, round, and reactive to light.  Cardiovascular:     Rate and Rhythm: Regular rhythm.     Pulses: Normal pulses.     Heart sounds: Normal heart sounds. No murmur heard.   No friction rub. No gallop.  Pulmonary:     Effort: Pulmonary effort is normal. No respiratory distress.     Breath sounds: Normal breath sounds. No wheezing.  Abdominal:     General: Bowel sounds are normal. There is no distension.     Palpations: Abdomen is soft.     Tenderness: There is no abdominal tenderness. There is no guarding or rebound.  Musculoskeletal:        General: Swelling present. No tenderness. Normal range of motion.     Cervical back: Normal range of motion and neck supple. No rigidity.     Comments: 2+ pitting edema to mid-tibia of bilateral lower extremities  Skin:    General: Skin is warm and dry.     Capillary Refill: Capillary refill takes less than 2 seconds.  Neurological:     Mental Status: He is disoriented.     Cranial Nerves: No cranial nerve deficit.     Sensory: No sensory deficit.     Motor: No weakness.     Comments: Oriented to self, location and situation but reports year as 1970 CN II-XII grossly intact Sensation and strength 5/5 in all extremities +asterixis in BUE   Pertinent Labs: CBC    Component Value Date/Time   WBC 8.1 03/30/2021 0810   RBC 4.28 03/30/2021 0810   HGB 11.6 (L) 03/30/2021 0857   HCT 34.0 (L) 03/30/2021 0857   PLT 210 03/30/2021 0810   MCV 85.3 03/30/2021 0810   MCH 26.6 03/30/2021 0810   MCHC 31.2 03/30/2021 0810   RDW 19.1 (H) 03/30/2021 0810   LYMPHSABS 1.7 03/30/2021 0810   MONOABS 1.9 (H) 03/30/2021 0810   EOSABS 0.3 03/30/2021 0810   BASOSABS 0.1 03/30/2021 0810     CMP     Component Value Date/Time   NA 141 03/30/2021 0857   K 4.3 03/30/2021 0857   CL 106 03/30/2021 0857   CO2 23 03/30/2021 0810   GLUCOSE 129 (H) 03/30/2021 0857   BUN 14 03/30/2021 0857   CREATININE 0.80 03/30/2021 0857   CALCIUM 9.1 03/30/2021 0810    PROT 7.1 03/30/2021 0810   ALBUMIN 3.0 (L) 03/30/2021 0810   AST 79 (H) 03/30/2021 0810   ALT 23 03/30/2021 0810   ALKPHOS 158 (H) 03/30/2021 0810   BILITOT 1.9 (H) 03/30/2021 0810   GFRNONAA >60 03/30/2021 0810    Pertinent Imaging: DG Chest 2 View  Result Date: 03/30/2021 CLINICAL DATA:  Altered mental status EXAM: CHEST - 2 VIEW COMPARISON:  None. FINDINGS: Criteria and mediastinal contours within normal limits for AP technique. Lungs are clear. Pleural effusion or pneumothorax. IMPRESSION: No active  cardiopulmonary disease. Electronically Signed   By: Yetta Glassman M.D.   On: 03/30/2021 08:58   CT Head Wo Contrast  Result Date: 03/30/2021 CLINICAL DATA:  Mental status changes EXAM: CT HEAD WITHOUT CONTRAST TECHNIQUE: Contiguous axial images were obtained from the base of the skull through the vertex without intravenous contrast. COMPARISON:  None. FINDINGS: Brain: No acute intracranial abnormality. Specifically, no hemorrhage, hydrocephalus, mass lesion, acute infarction, or significant intracranial injury. Vascular: No hyperdense vessel or unexpected calcification. Skull: No acute calvarial abnormality. Sinuses/Orbits: No acute findings Other: None IMPRESSION: No acute intracranial abnormality. Electronically Signed   By: Rolm Baptise M.D.   On: 03/30/2021 09:21   MR BRAIN WO CONTRAST  Result Date: 03/30/2021 CLINICAL DATA:  Acute neuro deficit. EXAM: MRI HEAD WITHOUT CONTRAST TECHNIQUE: Multiplanar, multiecho pulse sequences of the brain and surrounding structures were obtained without intravenous contrast. COMPARISON:  CT head 03/30/2021 FINDINGS: Brain: Limited study. The patient was moving. Images degraded by motion. Patient not able to complete the study which was terminated early Diffusion-weighted imaging demonstrates no acute infarct. Ventricle size and cerebral volume normal. No significant chronic ischemia. No mass or fluid collection. Vascular: Normal arterial flow voids. Skull  and upper cervical spine: Negative Sinuses/Orbits: Mild mucosal edema paranasal sinuses. Negative orbit Other: None IMPRESSION: Limited study due to motion.  Patient not able to complete the exam No acute abnormality identified. Electronically Signed   By: Franchot Gallo M.D.   On: 03/30/2021 13:18   US Abdomen Limited RUQ (LIVER/GB)  Result Date: 03/30/2021 CLINICAL DATA:  Cirrhosis, elevated bilirubin EXAM: ULTRASOUND ABDOMEN LIMITED RIGHT UPPER QUADRANT COMPARISON:  None. FINDINGS: Gallbladder: There is a small 3 mm calculus dependent within the gallbladder. No gallbladder wall thickening or pericholecystic fluid. Negative sonographic Murphy sign. Common bile duct: Diameter: 2 mm Liver: Heterogeneous echotexture of the liver parenchyma compatible with given history of cirrhosis. No focal liver abnormality or intrahepatic duct dilation. Portal vein is patent on color Doppler imaging with normal direction of blood flow towards the liver. Other: None. IMPRESSION: 1. Cholelithiasis without cholecystitis. 2. Cirrhosis without focal abnormality. Electronically Signed   By: Randa Ngo M.D.   On: 03/30/2021 14:59    EKG: personally reviewed my interpretation is normal sinus rhythm, LAFB; HR 67, QTc 454 - no prior EKG tracings to compare  ASSESSMENT & PLAN:  Assessment: Active Problems:   AMS (altered mental status)   EIDAN MUELLNER is a 55 y.o. with pertinent PMH of bipolar disorder and depression who presented with altered mental status and admit for acute hepatic encephalopathy on hospital day 0  Plan: #Acute hepatic encephalopathy Patient presenting with altered mental status since yesterday evening. On morning of admission, noted to be significantly altered with bizarre behavior. On presentation, afebrile and normotensive. CBC without leukocytosis and UA and CXR negative for infectious etiology. ETOH and salicylate levels wnl. Urine tox negative. CT Head negative for acute intracranial  abnormalities. Follow up MRI Brain degraded by motion artifact. CMP with elevated AST to 79, Alkaline phosphatase 158 and total bilirubin of 1.9. On exam, patient is somnolent but easily arousable to voice. He is oriented to self, location and situation; however, reports the year as 72. He has asterixis on exam. RUQ [place] consistent with liver cirrhosis and cholelithiasis without cholecystitis. Suspect patient's encephalopathy is secondary to cirrhosis. This is likely in setting of history of alcohol use.  - Lactulose 30mg  tid with goal of 2-3 bowel movements daily - Fractionated total bilirubin - Trend CMP -  F/u INR  #Normocytic anemia  Patient noted to have Hb 11.4, MCV 85.3. No prior CBC for comparison. No signs of active bleeding.  -  Iron studies - Vitamin B12 and folate - Trend CBC  #Low A/G ratio A/G ratio of 0.7. Likely 2/2 chronic liver disease. Urinalysis with moderate hemoglobinuria but no proteinuria. No history of autoimmune disorders. Corrected calcium wnl.  - SPEP/UPEP/IFE/ Kappa/lambda   #LE swelling Patient noted to have 2+ pitting edema of bilateral lower extremities to the mid tibia. Per wife, this has been present for several months and was previously attributed to standing for long hours. No appreciable JVD on exam and does not have a history of heart failure.  - Echo, TSH  - IV Lasix 40mg  x1  - Strict I&O, daily weights  #Hx of bipolar disorder Patient on depakote for history of bipolar disorder. Valproic acid levels 48 on presentation. Wife notes that patient has had more of a flat affect since starting the depakote.  - Continue depakote 500mg  daily   #Hx of depression - Continue escitalopram 5mg  daily   Best Practice: Diet: NPO except sips with meds IVF: Fluids: None, Rate: None VTE: rivaroxaban (XARELTO) tablet 10 mg Start: 03/30/21 1100 Code: Full AB: None Status: Observation with expected length of stay less than 2 midnights. Anticipated Discharge  Location: Home Barriers to Discharge: Medical stability and Behavior  Signature: Harvie Heck, MD Internal Medicine Resident, PGY-3 [facility]  Pager: [phone] 3:47 PM, 03/30/2021   Please contact the on call pager after 5 pm and on weekends at [phone].

## 2021-03-30 NOTE — ED Notes (Signed)
Patient's bed linens changed and patient repositioned for comfort, patient transported to MRI.

## 2021-03-31 DIAGNOSIS — F319 Bipolar disorder, unspecified: Secondary | ICD-10-CM | POA: Diagnosis present

## 2021-03-31 DIAGNOSIS — R791 Abnormal coagulation profile: Secondary | ICD-10-CM | POA: Diagnosis present

## 2021-03-31 DIAGNOSIS — K7682 Hepatic encephalopathy: Secondary | ICD-10-CM | POA: Diagnosis present

## 2021-03-31 DIAGNOSIS — K7040 Alcoholic hepatic failure without coma: Secondary | ICD-10-CM | POA: Diagnosis present

## 2021-03-31 DIAGNOSIS — G4730 Sleep apnea, unspecified: Secondary | ICD-10-CM | POA: Diagnosis present

## 2021-03-31 DIAGNOSIS — R278 Other lack of coordination: Secondary | ICD-10-CM | POA: Diagnosis present

## 2021-03-31 DIAGNOSIS — R634 Abnormal weight loss: Secondary | ICD-10-CM | POA: Diagnosis present

## 2021-03-31 DIAGNOSIS — K900 Celiac disease: Secondary | ICD-10-CM | POA: Diagnosis present

## 2021-03-31 DIAGNOSIS — Y900 Blood alcohol level of less than 20 mg/100 ml: Secondary | ICD-10-CM | POA: Diagnosis present

## 2021-03-31 DIAGNOSIS — F10288 Alcohol dependence with other alcohol-induced disorder: Secondary | ICD-10-CM | POA: Diagnosis present

## 2021-03-31 DIAGNOSIS — D649 Anemia, unspecified: Secondary | ICD-10-CM | POA: Diagnosis present

## 2021-03-31 DIAGNOSIS — K7030 Alcoholic cirrhosis of liver without ascites: Secondary | ICD-10-CM | POA: Diagnosis present

## 2021-03-31 DIAGNOSIS — I444 Left anterior fascicular block: Secondary | ICD-10-CM | POA: Diagnosis present

## 2021-03-31 DIAGNOSIS — R823 Hemoglobinuria: Secondary | ICD-10-CM | POA: Diagnosis present

## 2021-03-31 DIAGNOSIS — Z20822 Contact with and (suspected) exposure to covid-19: Secondary | ICD-10-CM | POA: Diagnosis present

## 2021-03-31 DIAGNOSIS — Z6823 Body mass index (BMI) 23.0-23.9, adult: Secondary | ICD-10-CM | POA: Diagnosis not present

## 2021-03-31 DIAGNOSIS — F1090 Alcohol use, unspecified, uncomplicated: Secondary | ICD-10-CM | POA: Diagnosis present

## 2021-03-31 DIAGNOSIS — G9340 Encephalopathy, unspecified: Secondary | ICD-10-CM

## 2021-03-31 DIAGNOSIS — R4182 Altered mental status, unspecified: Secondary | ICD-10-CM | POA: Diagnosis present

## 2021-03-31 DIAGNOSIS — M549 Dorsalgia, unspecified: Secondary | ICD-10-CM | POA: Diagnosis present

## 2021-03-31 DIAGNOSIS — I959 Hypotension, unspecified: Secondary | ICD-10-CM | POA: Diagnosis not present

## 2021-03-31 DIAGNOSIS — G8929 Other chronic pain: Secondary | ICD-10-CM | POA: Diagnosis present

## 2021-03-31 DIAGNOSIS — F17220 Nicotine dependence, chewing tobacco, uncomplicated: Secondary | ICD-10-CM | POA: Diagnosis present

## 2021-03-31 DIAGNOSIS — Z79899 Other long term (current) drug therapy: Secondary | ICD-10-CM | POA: Diagnosis not present

## 2021-03-31 LAB — PROTIME-INR
INR: 3.2 — ABNORMAL HIGH (ref 0.8–1.2)
Prothrombin Time: 32.3 seconds — ABNORMAL HIGH (ref 11.4–15.2)

## 2021-03-31 LAB — IRON AND TIBC
Iron: 72 ug/dL (ref 45–182)
Saturation Ratios: 14 % — ABNORMAL LOW (ref 17.9–39.5)
TIBC: 514 ug/dL — ABNORMAL HIGH (ref 250–450)
UIBC: 442 ug/dL

## 2021-03-31 LAB — CBC
HCT: 35.6 % — ABNORMAL LOW (ref 39.0–52.0)
Hemoglobin: 11.1 g/dL — ABNORMAL LOW (ref 13.0–17.0)
MCH: 26.7 pg (ref 26.0–34.0)
MCHC: 31.2 g/dL (ref 30.0–36.0)
MCV: 85.6 fL (ref 80.0–100.0)
Platelets: 238 10*3/uL (ref 150–400)
RBC: 4.16 MIL/uL — ABNORMAL LOW (ref 4.22–5.81)
RDW: 19.1 % — ABNORMAL HIGH (ref 11.5–15.5)
WBC: 9.3 10*3/uL (ref 4.0–10.5)
nRBC: 0 % (ref 0.0–0.2)

## 2021-03-31 LAB — COMPREHENSIVE METABOLIC PANEL
ALT: 23 U/L (ref 0–44)
AST: 84 U/L — ABNORMAL HIGH (ref 15–41)
Albumin: 2.9 g/dL — ABNORMAL LOW (ref 3.5–5.0)
Alkaline Phosphatase: 127 U/L — ABNORMAL HIGH (ref 38–126)
Anion gap: 10 (ref 5–15)
BUN: 16 mg/dL (ref 6–20)
CO2: 23 mmol/L (ref 22–32)
Calcium: 9.2 mg/dL (ref 8.9–10.3)
Chloride: 105 mmol/L (ref 98–111)
Creatinine, Ser: 1.11 mg/dL (ref 0.61–1.24)
GFR, Estimated: >60 mL/min (ref >60)
Glucose, Bld: 110 mg/dL — ABNORMAL HIGH (ref 70–99)
Potassium: 4.1 mmol/L (ref 3.5–5.1)
Sodium: 138 mmol/L (ref 135–145)
Total Bilirubin: 1.9 mg/dL — ABNORMAL HIGH (ref 0.3–1.2)
Total Protein: 6.9 g/dL (ref 6.5–8.1)

## 2021-03-31 LAB — TSH: TSH: 1.762 u[IU]/mL (ref 0.350–4.500)

## 2021-03-31 LAB — FERRITIN: Ferritin: 27 ng/mL (ref 24–336)

## 2021-03-31 LAB — VITAMIN B12: Vitamin B-12: 1095 pg/mL — ABNORMAL HIGH (ref 180–914)

## 2021-03-31 LAB — BILIRUBIN, FRACTIONATED(TOT/DIR/INDIR)
Bilirubin, Direct: 0.5 mg/dL — ABNORMAL HIGH (ref 0.0–0.2)
Indirect Bilirubin: 1.1 mg/dL — ABNORMAL HIGH (ref 0.3–0.9)
Total Bilirubin: 1.6 mg/dL — ABNORMAL HIGH (ref 0.3–1.2)

## 2021-03-31 LAB — HIV ANTIBODY (ROUTINE TESTING W REFLEX): HIV Screen 4th Generation wRfx: NONREACTIVE

## 2021-03-31 LAB — FOLATE: Folate: 12.8 ng/mL (ref >5.9)

## 2021-03-31 MED ORDER — NICOTINE 14 MG/24HR TD PT24
14.0000 mg | MEDICATED_PATCH | Freq: Every day | TRANSDERMAL | Status: DC
  Administered 2021-03-31 – 2021-04-02 (×3): 14 mg via TRANSDERMAL
  Filled 2021-03-31 (×3): qty 1

## 2021-03-31 MED ORDER — FOLIC ACID 1 MG PO TABS
1.0000 mg | ORAL_TABLET | Freq: Every day | ORAL | Status: DC
  Administered 2021-04-01 – 2021-04-02 (×2): 1 mg via ORAL
  Filled 2021-03-31 (×2): qty 1

## 2021-03-31 MED ORDER — SPIRONOLACTONE 25 MG PO TABS
50.0000 mg | ORAL_TABLET | Freq: Every day | ORAL | Status: DC
  Administered 2021-03-31 – 2021-04-02 (×3): 50 mg via ORAL
  Filled 2021-03-31 (×3): qty 2

## 2021-03-31 MED ORDER — ADULT MULTIVITAMIN W/MINERALS CH
1.0000 | ORAL_TABLET | Freq: Every day | ORAL | Status: DC
  Administered 2021-04-01 – 2021-04-02 (×2): 1 via ORAL
  Filled 2021-03-31 (×2): qty 1

## 2021-03-31 MED ORDER — THIAMINE HCL 100 MG PO TABS
100.0000 mg | ORAL_TABLET | Freq: Every day | ORAL | Status: DC
  Administered 2021-04-01 – 2021-04-02 (×2): 100 mg via ORAL
  Filled 2021-03-31 (×2): qty 1

## 2021-03-31 NOTE — Progress Notes (Addendum)
HD#0 SUBJECTIVE:  Patient Summary: Benjamin Knight is a 55 y.o. with a pertinent PMH of depression and bipolar disorder, and chronic abck pain, who presented with altered mental status and admitted for acute hepatic encephalopathy on hospital day 1.   Overnight Events: No overnight events  Interim History: This AM, Benjamin Knight states he is feeling better today. He realizes what brought him in is lethargy, slurred speech. He states it has been occurring more and more in the last couple months with progressive worsening. He has also been experiencing bilateral leg swelling for about 1 year now.   His wife at bedside reports he is more cognizant this morning.   He works in Loss adjuster, chartered.   He has a history of Celiac disease.   OBJECTIVE:  Vital Signs: Vitals:   03/31/21 0130 03/31/21 0145 03/31/21 0215 03/31/21 0245  BP: 98/60 (!) 92/56 109/72 113/61  Pulse: 70 (!) 58 70 64  Resp: 19 (!) 22 17 14   Temp:      TempSrc:      SpO2: 98% 97% 100% 99%   Supplemental O2: Room Air SpO2: 99 %  There were no vitals filed for this visit.   Intake/Output Summary (Last 24 hours) at 03/31/2021 0502 Last data filed at 03/31/2021 0253 Gross per 24 hour  Intake --  Output 1500 ml  Net -1500 ml   Net IO Since Admission: -1,500 mL [03/31/21 0502]  Physical Exam: General: well-Developed, well nourished HENT: No jaundice of sub lingual space, NCAT Eyes: no scleral icterus, conjunctiva clear CV: Rate and rhythm, no murmurs Pulm: To auscultation bilaterally, pulmonary effort normal GI: No tenderness, bowel sounds present, no abdominal distention present MSK: Asterixis present in hands bilaterally, 1+ bilateral lower extremity pitting edema noted on exam Skin: Warm and dry Neuro: Oriented x3 but unable to do simple 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 Psych: Per wife patient's affect is improved today, but he still laughs inappropriately only during conversation  Patient  Lines/Drains/Airways Status     Active Line/Drains/Airways     Name Placement date Placement time Site Days   Peripheral IV 03/30/21 20 G Right Antecubital 03/30/21  1014  Antecubital  1            Pertinent Labs: CBC Latest Ref Rng & Units 03/31/2021 03/30/2021 03/30/2021  WBC 4.0 - 10.5 K/uL 9.3 - 8.1  Hemoglobin 13.0 - 17.0 g/dL 11.1(L) 11.6(L) 11.4(L)  Hematocrit 39.0 - 52.0 % 35.6(L) 34.0(L) 36.5(L)  Platelets 150 - 400 K/uL 238 - 210    CMP Latest Ref Rng & Units 03/31/2021 03/30/2021 03/30/2021  Glucose 70 - 99 mg/dL 110(H) 129(H) 135(H)  BUN 6 - 20 mg/dL 16 14 11   Creatinine 0.61 - 1.24 mg/dL 1.11 0.80 0.82  Sodium 135 - 145 mmol/L 138 141 139  Potassium 3.5 - 5.1 mmol/L 4.1 4.3 4.3  Chloride 98 - 111 mmol/L 105 106 107  CO2 22 - 32 mmol/L 23 - 23  Calcium 8.9 - 10.3 mg/dL 9.2 - 9.1  Total Protein 6.5 - 8.1 g/dL 6.9 - 7.1  Total Bilirubin 0.3 - 1.2 mg/dL 1.9(H) - 1.9(H)  Alkaline Phos 38 - 126 U/L 127(H) - 158(H)  AST 15 - 41 U/L 84(H) - 79(H)  ALT 0 - 44 U/L 23 - 23    Recent Labs    03/30/21 0802  GLUCAP 127*     Pertinent Imaging: DG Chest 2 View  Result Date: 03/30/2021 CLINICAL DATA:  Altered mental status  EXAM: CHEST - 2 VIEW COMPARISON:  None. FINDINGS: Criteria and mediastinal contours within normal limits for AP technique. Lungs are clear. Pleural effusion or pneumothorax. IMPRESSION: No active cardiopulmonary disease. Electronically Signed   By: Yetta Glassman M.D.   On: 03/30/2021 08:58   CT Head Wo Contrast  Result Date: 03/30/2021 CLINICAL DATA:  Mental status changes EXAM: CT HEAD WITHOUT CONTRAST TECHNIQUE: Contiguous axial images were obtained from the base of the skull through the vertex without intravenous contrast. COMPARISON:  None. FINDINGS: Brain: No acute intracranial abnormality. Specifically, no hemorrhage, hydrocephalus, mass lesion, acute infarction, or significant intracranial injury. Vascular: No hyperdense vessel or unexpected  calcification. Skull: No acute calvarial abnormality. Sinuses/Orbits: No acute findings Other: None IMPRESSION: No acute intracranial abnormality. Electronically Signed   By: Rolm Baptise M.D.   On: 03/30/2021 09:21   MR BRAIN WO CONTRAST  Result Date: 03/30/2021 CLINICAL DATA:  Acute neuro deficit. EXAM: MRI HEAD WITHOUT CONTRAST TECHNIQUE: Multiplanar, multiecho pulse sequences of the brain and surrounding structures were obtained without intravenous contrast. COMPARISON:  CT head 03/30/2021 FINDINGS: Brain: Limited study. The patient was moving. Images degraded by motion. Patient not able to complete the study which was terminated early Diffusion-weighted imaging demonstrates no acute infarct. Ventricle size and cerebral volume normal. No significant chronic ischemia. No mass or fluid collection. Vascular: Normal arterial flow voids. Skull and upper cervical spine: Negative Sinuses/Orbits: Mild mucosal edema paranasal sinuses. Negative orbit Other: None IMPRESSION: Limited study due to motion.  Patient not able to complete the exam No acute abnormality identified. Electronically Signed   By: Franchot Gallo M.D.   On: 03/30/2021 13:18   ECHOCARDIOGRAM COMPLETE  Result Date: 03/30/2021    ECHOCARDIOGRAM REPORT   Patient Name:   Benjamin Knight Date of Exam: 03/30/2021 Medical Rec #:  [id number]        Height:       69.0 in Accession #:    [id number]       Weight:       227.2 lb Date of Birth:  August 19, 1965        BSA:          2.181 m Patient Age:    67 years         BP:           121/79 mmHg Patient Gender: M                HR:           62 bpm. Exam Location:  Inpatient Procedure: 2D Echo, Cardiac Doppler and Color Doppler Indications:    Cardiomyopathy  History:        Patient has no prior history of Echocardiogram examinations.  Sonographer:    Maudry Mayhew MHA, RDMS, RVT, RDCS Referring Phys: [id number] [facility] Benjamin Knight  Sonographer Comments: Image acquisition challenging due to uncooperative patient,  Image acquisition challenging due to respiratory motion and patient unable to lie still. IMPRESSIONS  1. Left ventricular ejection fraction, by estimation, is 55 to 60%. The left ventricle has normal function. The left ventricle has no regional wall motion abnormalities. Left ventricular diastolic parameters were normal.  2. Right ventricular systolic function is normal. The right ventricular size is normal. There is normal pulmonary artery systolic pressure. The estimated right ventricular systolic pressure is 30.8 mmHg.  3. The mitral valve is normal in structure. No evidence of mitral valve regurgitation. No evidence of mitral stenosis.  4. The aortic valve is tricuspid. Aortic valve  regurgitation is not visualized. No aortic stenosis is present. Comparison(s): No prior Echocardiogram. FINDINGS  Left Ventricle: Left ventricular ejection fraction, by estimation, is 55 to 60%. The left ventricle has normal function. The left ventricle has no regional wall motion abnormalities. The left ventricular internal cavity size was normal in size. There is  no left ventricular hypertrophy. Left ventricular diastolic parameters were normal. Right Ventricle: The right ventricular size is normal. No increase in right ventricular wall thickness. Right ventricular systolic function is normal. There is normal pulmonary artery systolic pressure. The tricuspid regurgitant velocity is 2.09 m/s, and  with an assumed right atrial pressure of 3 mmHg, the estimated right ventricular systolic pressure is 18.8 mmHg. Left Atrium: Left atrial size was normal in size. Right Atrium: Right atrial size was normal in size. Pericardium: There is no evidence of pericardial effusion. Mitral Valve: The mitral valve is normal in structure. No evidence of mitral valve regurgitation. No evidence of mitral valve stenosis. Tricuspid Valve: The tricuspid valve is normal in structure. Tricuspid valve regurgitation is mild . No evidence of tricuspid  stenosis. Aortic Valve: The aortic valve is tricuspid. Aortic valve regurgitation is not visualized. No aortic stenosis is present. Aortic valve mean gradient measures 7.0 mmHg. Aortic valve peak gradient measures 12.7 mmHg. Aortic valve area, by VTI measures 2.23  cm. Pulmonic Valve: The pulmonic valve was not well visualized. Pulmonic valve regurgitation is not visualized. No evidence of pulmonic stenosis. Aorta: The aortic root is normal in size and structure. IAS/Shunts: The atrial septum is grossly normal.  LEFT VENTRICLE PLAX 2D LVIDd:         4.50 cm      Diastology LVIDs:         3.20 cm      LV e' medial:    7.40 cm/s LV PW:         0.90 cm      LV E/e' medial:  14.3 LV IVS:        0.70 cm      LV e' lateral:   11.90 cm/s LVOT diam:     2.00 cm      LV E/e' lateral: 8.9 LV SV:         72 LV SV Index:   33 LVOT Area:     3.14 cm  LV Volumes (MOD) LV vol d, MOD A2C: 69.7 ml LV vol d, MOD A4C: 129.0 ml LV vol s, MOD A2C: 25.7 ml LV vol s, MOD A4C: 55.5 ml LV SV MOD A2C:     44.0 ml LV SV MOD A4C:     129.0 ml LV SV MOD BP:      58.0 ml RIGHT VENTRICLE RV S prime:     16.80 cm/s TAPSE (M-mode): 2.1 cm LEFT ATRIUM           Index       RIGHT ATRIUM           Index LA diam:      3.70 cm 1.70 cm/m  RA Area:     12.50 cm LA Vol (A4C): 55.8 ml 25.58 ml/m RA Volume:   24.30 ml  11.14 ml/m  AORTIC VALVE AV Area (Vmax):    2.63 cm AV Area (Vmean):   3.16 cm AV Area (VTI):     2.23 cm AV Vmax:           178.00 cm/s AV Vmean:          100.500 cm/s AV  VTI:            0.324 m AV Peak Grad:      12.7 mmHg AV Mean Grad:      7.0 mmHg LVOT Vmax:         149.00 cm/s LVOT Vmean:        101.000 cm/s LVOT VTI:          0.230 m LVOT/AV VTI ratio: 0.71 MITRAL VALVE                TRICUSPID VALVE MV Area (PHT): 2.42 cm     TR Peak grad:   17.5 mmHg MV Decel Time: 314 msec     TR Vmax:        209.00 cm/s MV E velocity: 106.00 cm/s MV A velocity: 96.10 cm/s   SHUNTS MV E/A ratio:  1.10         Systemic VTI:  0.23 m                              Systemic Diam: 2.00 cm Rudean Haskell MD Electronically signed by Rudean Haskell MD Signature Date/Time: 03/30/2021/4:53:36 PM    Final    US Abdomen Limited RUQ (LIVER/GB)  Result Date: 03/30/2021 CLINICAL DATA:  Cirrhosis, elevated bilirubin EXAM: ULTRASOUND ABDOMEN LIMITED RIGHT UPPER QUADRANT COMPARISON:  None. FINDINGS: Gallbladder: There is a small 3 mm calculus dependent within the gallbladder. No gallbladder wall thickening or pericholecystic fluid. Negative sonographic Murphy sign. Common bile duct: Diameter: 2 mm Liver: Heterogeneous echotexture of the liver parenchyma compatible with given history of cirrhosis. No focal liver abnormality or intrahepatic duct dilation. Portal vein is patent on color Doppler imaging with normal direction of blood flow towards the liver. Other: None. IMPRESSION: 1. Cholelithiasis without cholecystitis. 2. Cirrhosis without focal abnormality. Electronically Signed   By: Randa Ngo M.D.   On: 03/30/2021 14:59    ASSESSMENT/PLAN:  Assessment: Active Problems:   AMS (altered mental status)  Benjamin Knight is a 55 y.o. with a pertinent PMH of depression and bipolar disorder, and chronic abck pain, who presented with altered mental status and admitted for acute hepatic encephalopathy on hospital day 1.   Plan: #Acute hepatic encephalopathy #Cirrhosis Patient presented due to worsening of bizarre behavior.  Behavior had been abnormal over the last couple of months.  Patient has a history of heavy alcohol use and was noted to have extra stress on exam and cirrhosis on his right upper quadrant ultrasound.  Patient was evaluated by psychiatry and started on Depakote and escitalopram due to diagnosis of bipolar disorder.  Feel changes are likely due to to hepatic encephalopathy rather than bipolar disorder.  PT-INR at 3.2, albumin 2.9, T bili 1.9, ammonia elevated at 93.  MELD score of 24 and Child Pugh Class B.  Patient also had lower  extremity edema on exam likely due to cirrhosis. In setting of other lab values, INR is abnormally elevated.  This could be related to his Depakote administration.  Patient states that he is interested in stopping alcohol use.  We will consider prescribing him naltrexone on discharge to help with cravings.  He states that he has not drank since Labor Day.  EtOH level <10 patient has had 2 bowel movements since yesterday.   -Lactulose 30 mg TID with goal of 2-3 bowel movements daily -Started spironolactone 50 mg, consider adding furosemide if swelling does not improve. -Trend CMP -Follow-up on fractioned  Bilirubin -Naltrexone on discharge -He will need to follow-up with GI -Discontinue Depakote  -Continue escitalopram  #Normocytic anemia On lab evaluation today, Hgb from 11.6 to 11.1 with MCV of 85.6. Folate, ferritin, and iron wnl, vitamin B12 elevated at 1,095.  This is likely due to alcohol use.  #A/G ratio A/G ratio of 0.7, UA with moderate hemoglobinuria, but no proteinuria. Corrected calcium wnl. -UPEP pending -Kappa/lamda light chain pending -Protein electrophoresis  Best Practice: Diet: Cardiac diet IVF: Fluids: none VTE: rivaroxaban (XARELTO) tablet 10 mg Start: 03/30/21 1100 Code: Full AB: none Family Contact: wife updated at bedside DISPO: Anticipated discharge tomorrow to Home pending  improvement in mental status .  Signature: Christiana Fuchs, D.O. Internal Medicine Resident, PGY-1 [facility]  Pager: [phone] 5:02 AM, 03/31/2021   Please contact the on call pager after 5 pm and on weekends at [phone].

## 2021-03-31 NOTE — Evaluation (Signed)
Physical Therapy Evaluation Patient Details Name: Benjamin Knight MRN: [id number] DOB: [birth year] Today's Date: 03/31/2021  History of Present Illness  55 y.o. male who presented to [facility] with altered mental status. Hepatic encephalopathy vs Depakote toxicity suspected. MRI brain negative. PMH: depression and bipolar disorder, and chronic back pain.   Clinical Impression  Pt admitted with above diagnosis. PTA pt lived at home with his wife, independent. On eval, he required supervision transfers, S/minG ambulation 350' without AD, and minG ascend/descend a flight of stairs with R rail. Mildly unsteady gait noted. BLE strength symmetrical. Pt will benefit from skilled PT to increase their independence and safety with mobility to allow discharge home. PT to follow acutely. No follow up services indicated.          Recommendations for follow up therapy are one component of a multi-disciplinary discharge planning process, led by the attending physician.  Recommendations may be updated based on patient status, additional functional criteria and insurance authorization.  Follow Up Recommendations No PT follow up;Supervision - Intermittent    Equipment Recommendations  None recommended by PT    Recommendations for Other Services       Precautions / Restrictions Precautions Precautions: Fall      Mobility  Bed Mobility Overal bed mobility: Modified Independent                  Transfers Overall transfer level: Needs assistance Equipment used: None Transfers: Sit to/from Stand Sit to Stand: Supervision         General transfer comment: supervision for safety  Ambulation/Gait Ambulation/Gait assistance: Supervision;Min guard Gait Distance (Feet): 350 Feet Assistive device: None Gait Pattern/deviations: Step-through pattern;Decreased stride length;Drifts right/left Gait velocity: varying speed. WFL in open areas, significant decrease in cadence when approaching turn or  obstacle   General Gait Details: supervision for level, open areas; min guard for turns and obstacles. Mildly unsteady at time but no overt LOB.  Stairs Stairs: Yes Stairs assistance: Min guard Stair Management: One rail Right;Forwards;Alternating pattern Number of Stairs: 11 General stair comments: min guard for safety  Wheelchair Mobility    Modified Rankin (Stroke Patients Only)       Balance Overall balance assessment: Mild deficits observed, not formally tested                                           Pertinent Vitals/Pain Pain Assessment: Faces Faces Pain Scale: Hurts little more Pain Location: R foot due to a sore Pain Descriptors / Indicators: Discomfort;Grimacing    Home Living Family/patient expects to be discharged to:: Private residence Living Arrangements: Spouse/significant other Available Help at Discharge: Family Type of Home: House Home Access: Stairs to enter Entrance Stairs-Rails: Psychiatric nurse of Steps: "just a few" Home Layout: One level Home Equipment: None      Prior Function Level of Independence: Independent         Comments: works in Associate Professor        Extremity/Trunk Assessment   Upper Extremity Assessment Upper Extremity Assessment: Defer to OT evaluation    Lower Extremity Assessment Lower Extremity Assessment: Overall WFL for tasks assessed (symmetrical)    Cervical / Trunk Assessment Cervical / Trunk Assessment: Normal  Communication   Communication: No difficulties  Cognition Arousal/Alertness: Awake/alert Behavior During Therapy: Flat affect Overall Cognitive Status: Impaired/Different from baseline Area of Impairment:  Orientation;Attention;Following commands;Safety/judgement;Problem solving;Awareness                 Orientation Level: Disoriented to;Time (when asked the month and year, pt stated: "9...29, no 28, no 38." When asked a second time  separating the question, "what is the month?'" then, "what is the year?" Pt able to answer correctly.) Current Attention Level: Sustained   Following Commands: Follows one step commands with increased time;Follows multi-step commands inconsistently;Follows multi-step commands with increased time Safety/Judgement: Decreased awareness of deficits;Decreased awareness of safety Awareness: Emergent Problem Solving: Slow processing;Difficulty sequencing;Decreased initiation;Requires verbal cues General Comments: When asked "why are you in the hospital" pt stated "dizziness." Flat affect and slow response time. Eyes closed when not interacting.      General Comments General comments (skin integrity, edema, etc.): VSS on RA    Exercises     Assessment/Plan    PT Assessment Patient needs continued PT services  PT Problem List Decreased mobility;Decreased safety awareness;Decreased knowledge of precautions;Decreased balance       PT Treatment Interventions Therapeutic activities;Gait training;Therapeutic exercise;Patient/family education;Balance training;Stair training;Functional mobility training    PT Goals (Current goals can be found in the Care Plan section)  Acute Rehab PT Goals Patient Stated Goal: home PT Goal Formulation: With patient Time For Goal Achievement: 04/14/21 Potential to Achieve Goals: Good    Frequency Min 3X/week   Barriers to discharge        Co-evaluation               AM-PAC PT "6 Clicks" Mobility  Outcome Measure Help needed turning from your back to your side while in a flat bed without using bedrails?: None Help needed moving from lying on your back to sitting on the side of a flat bed without using bedrails?: None Help needed moving to and from a bed to a chair (including a wheelchair)?: A Little Help needed standing up from a chair using your arms (e.g., wheelchair or bedside chair)?: A Little Help needed to walk in hospital room?: A  Little Help needed climbing 3-5 steps with a railing? : A Little 6 Click Score: 20    End of Session Equipment Utilized During Treatment: Gait belt Activity Tolerance: Patient tolerated treatment well Patient left: in bed;with call bell/phone within reach;with bed alarm set Nurse Communication: Mobility status PT Visit Diagnosis: Unsteadiness on feet (R26.81)    Time: 2633-3545 PT Time Calculation (min) (ACUTE ONLY): 17 min   Charges:   PT Evaluation $PT Eval Moderate Complexity: 1 Mod          Lorrin Goodell, PT  Office # [phone] Pager [phone]   Lorriane Shire 03/31/2021, 11:38 AM

## 2021-03-31 NOTE — Progress Notes (Signed)
Date: 03/31/2021  Patient name: Benjamin Knight  Medical record number: [id number]  Date of birth: [birth year]   I have seen and evaluated Hildred Laser and discussed their care with the Residency Team.  In brief, patient is a 55 year old male with a past medical history of depression/bipolar disorder and chronic back pain who presented to the ED with altered mental status x1 day.  Patient has been having mood and behavior changes over the last several months and was diagnosed with bipolar disorder before Labor Day.  He was started on medication for this and has had a noticeably flatter affect but had inappropriate responses to questions.  On the day prior to admission patient did not respond appropriately to conversations with his wife and the morning of admission patient mental status had acutely worsened.  Wife noted that patient was trying to make breakfast but had cracked eggs onto the floor and said he can get eggs into the pan.  He also tried to start his truck without his keys and attempted to urinate in the sink and was brought to the ED for further evaluation.  No chest pain, no shortness of breath, no palpitations, no lightheadedness, no syncope, no focal weakness, no tingling or numbness, no abdominal pain, nausea or vomiting, no diarrhea.  Today, patient mental status is much improved per wife at bedside.  He is oriented x3 but still unable to do simple calculations in his head  PMHx, Fam Hx, and/or Soc Hx : As per resident admit note  Vitals:   03/31/21 0649 03/31/21 0807  BP: 112/65 (!) 99/57  Pulse: (!) 58 61  Resp: 13 17  Temp:  97.9 F (36.6 C)  SpO2: 98% 98%   General: Awake, alert, oriented x3, NAD CVS: Regular in rhythm, normal heart sounds Lungs: CTA bilaterally Abdomen: Soft, nontender, nondistended, normoactive bowel sounds, umbilical hernia noted Extremities: 1+ bilateral lower extremity pitting edema noted on exam, nontender to palpation HEENT: Normocephalic,  atraumatic Skin: Warm and dry Neuro: Oriented x3 but unable to do simple calculations (asked to add a dime and nickel and a quarter and he said $0.35).  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  Patient also did have asterixis in bilateral upper extremities  Assessment and Plan: I have seen and evaluated the patient as outlined above. I agree with the formulated Assessment and Plan as detailed in the residents' note, with the following changes:   1.  Altered mental status likely secondary to acute hepatic encephalopathy: -Patient presented to ED with acutely worsening confusion from home in the setting of a history of heavy alcohol use and was noted to have asterixis on exam and cirrhosis on his right upper quadrant ultrasound.  I suspect that patient's behavioral changes over the last couple of months is likely secondary to hepatic encephalopathy and not bipolar disorder. -We will DC his Depakote and escitalopram as his mood and behavioral issues are likely secondary to underlying encephalopathy -We will continue to monitor CMP daily -Continue lactulose and titrate to 2-3 bowel movements per day -Patient and wife instructed about importance of alcohol cessation -Patient will need to follow-up with GI as an outpatient for further work-up of his cirrhosis including EGD to rule out varices as well as routine monitoring of his cirrhosis -We will start the patient on spironolactone 50 mg daily and consider adding Lasix 20 mg daily if his blood pressures tolerate this -Patient noted to have an elevated  INR of 3.2 and a decreased albumin of 2.9 likely secondary to decreased synthetic function of his liver secondary to his underlying cirrhosis -We will continue to monitor closely but I suspect he should be stable for DC home this weekend if he continues to improve -No further work-up for now  Aldine Contes, MD 9/30/202211:47 AM

## 2021-03-31 NOTE — Evaluation (Signed)
Clinical/Bedside Swallow Evaluation Patient Details  Name: Benjamin Knight MRN: [id number] Date of Birth: [birth year]  Today's Date: 03/31/2021 Time: SLP Start Time (ACUTE ONLY): 8366 SLP Stop Time (ACUTE ONLY): 0933 SLP Time Calculation (min) (ACUTE ONLY): 15 min  Past Medical History: History reviewed. No pertinent past medical history. Past Surgical History: History reviewed. No pertinent surgical history. HPI:  Pt is a 55 y.o. male who presented to [facility] with altered mental status. Hepatic encephalopathy vs Depakote toxicity suspected. MRI brain negative. PMH: depression and bipolar disorder, and chronic back pain.    Assessment / Plan / Recommendation  Clinical Impression  Pt was seen for bedside swallow evaluation with his wife present. Both parties denied the pt have having a history of dysphagia or any acute related symptoms. Oral mechanism exam was WNL and he presented with adequate, natural dentition. Pt tolerated all solids and liquids without signs or symptoms of oropharyngeal dysphagia. A regular texture diet with thin liquids is recommended at this time and further skilled SLP services are not clinically indicated for swallowing. SLP Visit Diagnosis: Dysphagia, unspecified (R13.10)    Aspiration Risk  No limitations    Diet Recommendation Regular;Thin liquid   Liquid Administration via: Cup;Straw Medication Administration: Whole meds with liquid Supervision: Patient able to self feed Postural Changes: Seated upright at 90 degrees    Other  Recommendations Oral Care Recommendations: Oral care BID;Patient independent with oral care    Recommendations for follow up therapy are one component of a multi-disciplinary discharge planning process, led by the attending physician.  Recommendations may be updated based on patient status, additional functional criteria and insurance authorization.  Follow up Recommendations None      Frequency and Duration            Prognosis  Prognosis for Safe Diet Advancement: Good      Swallow Study   General Date of Onset: 03/30/21 HPI: Pt is a 55 y.o. male who presented to [facility] with altered mental status. Hepatic encephalopathy vs Depakote toxicity suspected. MRI brain negative. PMH: depression and bipolar disorder, and chronic back pain. Type of Study: Bedside Swallow Evaluation Previous Swallow Assessment: none Diet Prior to this Study: Regular;Thin liquids Temperature Spikes Noted: No Respiratory Status: Room air History of Recent Intubation: No Behavior/Cognition: Alert;Cooperative;Pleasant mood;Confused Oral Cavity Assessment: Within Functional Limits Oral Care Completed by SLP: No Oral Cavity - Dentition: Adequate natural dentition Vision: Functional for self-feeding Self-Feeding Abilities: Able to feed self Patient Positioning: Upright in bed;Postural control adequate for testing Baseline Vocal Quality: Normal Volitional Cough: Strong Volitional Swallow: Able to elicit    Oral/Motor/Sensory Function Overall Oral Motor/Sensory Function: Within functional limits   Ice Chips Ice chips: Within functional limits Presentation: Spoon   Thin Liquid Thin Liquid: Within functional limits Presentation: Straw    Nectar Thick Nectar Thick Liquid: Not tested   Honey Thick Honey Thick Liquid: Not tested   Puree Puree: Within functional limits Presentation: Spoon   Solid     Solid: Within functional limits Presentation: Marrero I. Hardin Negus, [place], [place] Office number [phone] Pager [phone]  Horton Marshall 03/31/2021,9:44 AM

## 2021-03-31 NOTE — Evaluation (Addendum)
Occupational Therapy Evaluation Patient Details Name: Benjamin Knight MRN: [id number] DOB: May 01, 1966 Today's Date: 03/31/2021   History of Present Illness 55 y.o. male who presented to [facility] with altered mental status. Hepatic encephalopathy vs Depakote toxicity suspected. MRI brain negative. PMH: depression and bipolar disorder, and chronic back pain.   Clinical Impression   PTA, pt was living with his wife and was independent and working. Pt currently performing ADLs and functional mobility at Supervision level. Pt presenting with decreased ST memory, awareness, problem solving, and attention. Pt reporting he feel "slower" or "groggy". Pt would benefit from further acute OT to further assess cognition and facilitate safe dc to home. Recommend dc to home once medically stable. Discussed with patient that if cognition deficits continues, he may benefit from OP OT or SLP.      Recommendations for follow up therapy are one component of a multi-disciplinary discharge planning process, led by the attending physician.  Recommendations may be updated based on patient status, additional functional criteria and insurance authorization.   Follow Up Recommendations  Other (comment) (Discusses possibly need for OP OT or SLP pending progress)    Equipment Recommendations  None recommended by OT    Recommendations for Other Services       Precautions / Restrictions Precautions Precautions: Fall      Mobility Bed Mobility Overal bed mobility: Modified Independent                  Transfers Overall transfer level: Needs assistance Equipment used: None Transfers: Sit to/from Stand Sit to Stand: Supervision         General transfer comment: supervision for safety    Balance Overall balance assessment: Mild deficits observed, not formally tested                                         ADL either performed or assessed with clinical judgement   ADL Overall ADL's  : Needs assistance/impaired                                       General ADL Comments: Pt performing ADLs and functional mobility at Supervision level. Pt presenting with decreased problem sovling and awareness. requiring inreased time and cues     Vision Baseline Vision/History: 0 No visual deficits Ability to See in Adequate Light: 0 Adequate       Perception     Praxis      Pertinent Vitals/Pain Pain Assessment: Faces Faces Pain Scale: No hurt Pain Location: R foot due to a sore Pain Descriptors / Indicators: Discomfort;Grimacing Pain Intervention(s): Monitored during session     Hand Dominance Right   Extremity/Trunk Assessment Upper Extremity Assessment Upper Extremity Assessment: Overall WFL for tasks assessed   Lower Extremity Assessment Lower Extremity Assessment: Defer to PT evaluation   Cervical / Trunk Assessment Cervical / Trunk Assessment: Normal   Communication Communication Communication: No difficulties   Cognition Arousal/Alertness: Awake/alert Behavior During Therapy: WFL for tasks assessed/performed Overall Cognitive Status: Impaired/Different from baseline Area of Impairment: Following commands;Safety/judgement;Memory                 Orientation Level: Disoriented to;Situation Current Attention Level: Selective;Sustained Memory: Decreased short-term memory Following Commands: Follows multi-step commands with increased time Safety/Judgement: Decreased awareness of deficits;Decreased awareness of safety  Awareness: Emergent Problem Solving: Slow processing;Difficulty sequencing;Requires verbal cues General Comments: Baseline bipolar. Upon arrival, pt sleeping so unsure of impact of drowsiness on cognition during session. Pt presenting with intermittent laughing;at times not appropriate to conversation. Pt reporting he notices he is more groggy and slow. Able to recall 2/3 animals that start with a "C" when asked while  completing grooming task with increased time. With decreased short-term recall. After being asked to remember 3 words only able to recall 2/3 after 3 minutes.   General Comments  VSS on RA    Exercises     Shoulder Instructions      Home Living Family/patient expects to be discharged to:: Private residence Living Arrangements: Spouse/significant other Available Help at Discharge: Family Type of Home: House Home Access: Stairs to enter [employer] of Steps: "just a few" Entrance Stairs-Rails: Right;Left Home Layout: One level     Bathroom Shower/Tub: Tub/shower unit;Walk-in shower   Bathroom Toilet: Standard     Home Equipment: Shower seat   Additional Comments: Lives with wife, son lives in a group home.      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        OT Problem List: Decreased cognition;Impaired balance (sitting and/or standing);Decreased activity tolerance;Decreased safety awareness      OT Treatment/Interventions: Self-care/ADL training;Therapeutic exercise;Energy conservation;DME and/or AE instruction;Therapeutic activities;Patient/family education    OT Goals(Current goals can be found in the care plan section) Acute Rehab OT Goals Patient Stated Goal: home OT Goal Formulation: With patient Time For Goal Achievement: 04/14/21 Potential to Achieve Goals: Good  OT Frequency: Min 2X/week   Barriers to D/C:            Co-evaluation              AM-PAC OT "6 Clicks" Daily Activity     Outcome Measure Help from another person eating meals?: A Little Help from another person taking care of personal grooming?: A Little Help from another person toileting, which includes using toliet, bedpan, or urinal?: A Little Help from another person bathing (including washing, rinsing, drying)?: A Little Help from another person to put on and taking off  regular upper body clothing?: A Little Help from another person to put on and taking off regular lower body clothing?: A Little 6 Click Score: 18   End of Session Nurse Communication: Mobility status  Activity Tolerance: Patient tolerated treatment well Patient left: in bed;with call bell/phone within reach  OT Visit Diagnosis: Unsteadiness on feet (R26.81);Other abnormalities of gait and mobility (R26.89);Muscle weakness (generalized) (M62.81)                Time: 6440-3474 OT Time Calculation (min): 18 min Charges:  OT General Charges $OT Visit: 1 Visit OT Evaluation $OT Eval Low Complexity: 1 Low  Ellyce Lafevers MSOT, OTR/L Acute Rehab Pager: [phone] Office: [place] 03/31/2021, 3:00 PM

## 2021-03-31 NOTE — TOC Initial Note (Addendum)
Transition of Care [facility]) - Initial/Assessment Note    Patient Details  Name: Benjamin Knight MRN: [id number] Date of Birth: 1966-02-05  Transition of Care [facility]) CM/SW Contact:    Teal Bontrager Aileen Fass, [place] Work Phone Number: 03/31/2021, 11:19 AM  Clinical Narrative:                 CSW intern attempted to visit patient at bedside to provide resources for ETOH consult. Patient was asleep, attempt was unsuccessful.  12:30-12:45: CSW intern visited patient at bedside to provide them with a packet of  resources for alcohol use treatment centers. Patient states he is open to receiving treatment and will look them over with his wife.   Patient Goals and CMS Choice      Expected Discharge Plan and Services                                                Prior Living Arrangements/Services                       Activities of Daily Living      Permission Sought/Granted                  Emotional Assessment              Admission diagnosis:  Altered mental status [R41.82] AMS (altered mental status) [R41.82] Patient Active Problem List   Diagnosis Date Noted   AMS (altered mental status) 03/30/2021   SLEEP APNEA 12/03/2007   PCP:  Johna Roles, PA Pharmacy:   [id number] - [place], [place] [address] [place] [address] Phone: [phone] Fax: [phone]     Social Determinants of Health (SDOH) Interventions    Readmission Risk Interventions No flowsheet data found.

## 2021-03-31 NOTE — Discharge Summary (Signed)
Name: Benjamin Knight MRN: [id number] DOB: December 21, 1965 55 y.o. PCP: Johna Roles, PA  Date of Admission: 03/30/2021  7:58 AM Date of Discharge: 04/02/21 Attending Physician: Aldine Contes, MD  Discharge Diagnosis: 1.  Acute hepatic encephalopathy 2. Cirrhosis 3.  Normocytic anemia 4. Low A/G ratio 5. History of bipolar disorder 6. History of Depression   Discharge Medications: Allergies as of 04/02/2021   No Known Allergies      Medication List     STOP taking these medications    divalproex 500 MG DR tablet Commonly known as: DEPAKOTE   propranolol 10 MG tablet Commonly known as: INDERAL       TAKE these medications    escitalopram 10 MG tablet Commonly known as: LEXAPRO Take 5 mg by mouth in the morning.   folic acid 1 MG tablet Commonly known as: FOLVITE Take 1 tablet (1 mg total) by mouth daily. Start taking on: April 03, 2021   lactulose 10 GM/15ML solution Commonly known as: CHRONULAC Take 45 mLs (30 g total) by mouth 3 (three) times daily. Titrate to a goal of 2-3 bowel movements per day.   pantoprazole 40 MG tablet Commonly known as: PROTONIX Take 1 tablet (40 mg total) by mouth daily. Start taking on: April 03, 2021   Pepcid 20 MG tablet Generic drug: famotidine Take 10-20 mg by mouth 2 (two) times daily as needed for heartburn or indigestion.   spironolactone 50 MG tablet Commonly known as: ALDACTONE Take 1 tablet (50 mg total) by mouth daily. Start taking on: April 03, 2021   thiamine 100 MG tablet Take 1 tablet (100 mg total) by mouth daily. Start taking on: April 03, 2021        Disposition and follow-up:   [name] was discharged from [facility] in Good condition.  At the hospital follow up visit please address:  1.  Cirrhosis, encephalopathy, low A/G ratio, and bipolar disorder. Patient had some evidence of cirrhosis with encephalopathy and improved after lactulose. Please follow up  on mental status at appointment. Bipolar disorder, please restart Depakote if indicated as he had underlying cirrhosis which could contribute to mood changes. Patient had low albumin which could be secondary to his cirrhosis but multiple myeloma work up due to protein gap. Would like to stop drinking, might benefit from naltrexone. Referral to GI.   2.  Labs / imaging needed at time of follow-up: liver function for improvement  3.  Pending labs/ test needing follow-up: kappa/lambda light chain, protein electrophoresis  Follow-up Appointments: Refer to GI.   Hospital Course by problem list: # Acute hepatic encephalopathy #Cirrhosis Patient presented due to acute change in behavior.  His wife stated that over the last few months he had had some changes, but September 2019 he had worsening of those changes.  Wife noted the patient was try to make breakfast but had cracked eggs onto the floor and that he cannot get eggs into the pan he also try to start his truck without his keys and attempted to urinate in the sink and was brought to the ED for further evaluation.  He has a significant history of heavy alcohol use and was noted to have asterixis on exam and cirrhosis on his right upper quadrant ultrasound.  Patient's mental status changes likely secondary to cirrhosis and acute hepatic encephalopathy.  He was started on lactulose with titrations to 2-3 bowel movements per day.  Had a history of significant lower extremity edema  for the last year.  Lasix was given in the ED with improvement in edema.  We switched his Lasix to spironolactone 50 mg daily.  Patient was also noted to have an elevated INR of 3.2 and decreased albumin of 2.9.  His meld-na score is at 22 and child Pugh score is class B. He will require follow-up with a gastroenterologist.  Patient has not drank anything since Labor Day.  He would like to try to stop consuming alcohol.  Likely benefit from naltrexone.  #History of bipolar  disorder Was noted to have behavioral changes over the last several months and was recently diagnosed with bipolar disorder and started on Depakote and escitalopram.  Changes more likely related to hepatic cirrhosis and left encephalopathy from this.  Depakote is also noted to interact with INR and patient's INR significantly elevated at 3.2.  We will discontinue Depakote. Please restart if indicated.   #History of Depression Patient had history of depression and was on Escitalopram. Escitalopram continued. This could be due to underlying cirrhosis and alcohol use disorder itself. Please monitor.   Discharge subjective: No acute events overnight. Patient endorses feeling great. Inquired about going home.   Discharge Exam:   BP 114/74   Pulse 73   Temp 98 F (36.7 C) (Oral)   Resp 15   Ht 5' 9"  (1.753 m)   Wt 73.5 kg   SpO2 99%   BMI 23.93 kg/m  Discharge exam:  Physical Exam Constitutional:      General: He is not in acute distress.    Appearance: Normal appearance. He is not ill-appearing.  HENT:     Head: Normocephalic and atraumatic.     Right Ear: External ear normal.     Left Ear: External ear normal.     Nose: Nose normal.     Mouth/Throat:     Mouth: Mucous membranes are moist.     Pharynx: Oropharynx is clear. No oropharyngeal exudate or posterior oropharyngeal erythema.  Eyes:     Extraocular Movements: Extraocular movements intact.     Conjunctiva/sclera: Conjunctivae normal.     Pupils: Pupils are equal, round, and reactive to light.  Cardiovascular:     Rate and Rhythm: Normal rate and regular rhythm.     Pulses: Normal pulses.     Heart sounds: Normal heart sounds. No murmur heard.   No friction rub.  Pulmonary:     Effort: Pulmonary effort is normal.     Breath sounds: Normal breath sounds.  Abdominal:     General: Bowel sounds are normal.     Palpations: Abdomen is soft.     Tenderness: There is no abdominal tenderness.  Musculoskeletal:        General:  No swelling, tenderness, deformity or signs of injury. Normal range of motion.     Cervical back: Normal range of motion and neck supple.     Right lower leg: No edema.     Left lower leg: No edema.  Skin:    Capillary Refill: Capillary refill takes less than 2 seconds.     Coloration: Skin is not jaundiced or pale.     Findings: No erythema, lesion or rash.  Neurological:     General: No focal deficit present.     Mental Status: He is alert and oriented to person, place, and time. Mental status is at baseline.  Psychiatric:        Mood and Affect: Mood normal.        Behavior:  Behavior normal.     Pertinent Labs, Studies, and Procedures:  CBC Latest Ref Rng & Units 04/01/2021 03/31/2021 03/30/2021  WBC 4.0 - 10.5 K/uL 10.6(H) 9.3 -  Hemoglobin 13.0 - 17.0 g/dL 10.5(L) 11.1(L) 11.6(L)  Hematocrit 39.0 - 52.0 % 32.6(L) 35.6(L) 34.0(L)  Platelets 150 - 400 K/uL 228 238 -    CMP Latest Ref Rng & Units 04/02/2021 04/01/2021 03/31/2021  Glucose 70 - 99 mg/dL 132(H) 107(H) -  BUN 6 - 20 mg/dL 17 22(H) -  Creatinine 0.61 - 1.24 mg/dL 1.06 1.15 -  Sodium 135 - 145 mmol/L 139 137 -  Potassium 3.5 - 5.1 mmol/L 4.3 3.7 -  Chloride 98 - 111 mmol/L 107 106 -  CO2 22 - 32 mmol/L 22 23 -  Calcium 8.9 - 10.3 mg/dL 9.1 8.5(L) -  Total Protein 6.5 - 8.1 g/dL 6.7 6.4(L) -  Total Bilirubin 0.3 - 1.2 mg/dL 1.9(H) 1.5(H) 1.6(H)  Alkaline Phos 38 - 126 U/L 121 125 -  AST 15 - 41 U/L 98(H) 75(H) -  ALT 0 - 44 U/L 26 21 -    INR: 2.9 04/02/21  INR: 3.2 04/01/21 Ammonia: 93 03/30/21  DG Chest 2 View  Result Date: 03/30/2021 CLINICAL DATA:  Altered mental status EXAM: CHEST - 2 VIEW COMPARISON:  None. FINDINGS: Criteria and mediastinal contours within normal limits for AP technique. Lungs are clear. Pleural effusion or pneumothorax. IMPRESSION: No active cardiopulmonary disease. Electronically Signed   By: Yetta Glassman M.D.   On: 03/30/2021 08:58   CT Head Wo Contrast  Result Date:  03/30/2021 CLINICAL DATA:  Mental status changes EXAM: CT HEAD WITHOUT CONTRAST TECHNIQUE: Contiguous axial images were obtained from the base of the skull through the vertex without intravenous contrast. COMPARISON:  None. FINDINGS: Brain: No acute intracranial abnormality. Specifically, no hemorrhage, hydrocephalus, mass lesion, acute infarction, or significant intracranial injury. Vascular: No hyperdense vessel or unexpected calcification. Skull: No acute calvarial abnormality. Sinuses/Orbits: No acute findings Other: None IMPRESSION: No acute intracranial abnormality. Electronically Signed   By: Rolm Baptise M.D.   On: 03/30/2021 09:21   MR BRAIN WO CONTRAST  Result Date: 03/30/2021 CLINICAL DATA:  Acute neuro deficit. EXAM: MRI HEAD WITHOUT CONTRAST TECHNIQUE: Multiplanar, multiecho pulse sequences of the brain and surrounding structures were obtained without intravenous contrast. COMPARISON:  CT head 03/30/2021 FINDINGS: Brain: Limited study. The patient was moving. Images degraded by motion. Patient not able to complete the study which was terminated early Diffusion-weighted imaging demonstrates no acute infarct. Ventricle size and cerebral volume normal. No significant chronic ischemia. No mass or fluid collection. Vascular: Normal arterial flow voids. Skull and upper cervical spine: Negative Sinuses/Orbits: Mild mucosal edema paranasal sinuses. Negative orbit Other: None IMPRESSION: Limited study due to motion.  Patient not able to complete the exam No acute abnormality identified. Electronically Signed   By: Franchot Gallo M.D.   On: 03/30/2021 13:18   ECHOCARDIOGRAM COMPLETE  Result Date: 03/30/2021    ECHOCARDIOGRAM REPORT   Patient Name:   JEREMIE GIANGRANDE Date of Exam: 03/30/2021  MPRESSIONS  1. Left ventricular ejection fraction, by estimation, is 55 to 60%. The left ventricle has normal function. The left ventricle has no regional wall motion abnormalities. Left ventricular diastolic  parameters were normal.  2. Right ventricular systolic function is normal. The right ventricular size is normal. There is normal pulmonary artery systolic pressure. The estimated right ventricular systolic pressure is 15.1 mmHg.  3. The mitral valve is normal in structure.  No evidence of mitral valve regurgitation. No evidence of mitral stenosis.  4. The aortic valve is tricuspid. Aortic valve regurgitation is not visualized. No aortic stenosis is present. Comparison(s): No prior Echocardiogram. US Abdomen Limited RUQ (LIVER/GB)  Result Date: 03/30/2021 CLINICAL DATA:  Cirrhosis, elevated bilirubin EXAM: ULTRASOUND ABDOMEN LIMITED RIGHT UPPER QUADRANT COMPARISON:  None. FINDINGS: Gallbladder: There is a small 3 mm calculus dependent within the gallbladder. No gallbladder wall thickening or pericholecystic fluid. Negative sonographic Murphy sign. Common bile duct: Diameter: 2 mm Liver: Heterogeneous echotexture of the liver parenchyma compatible with given history of cirrhosis. No focal liver abnormality or intrahepatic duct dilation. Portal vein is patent on color Doppler imaging with normal direction of blood flow towards the liver. Other: None. IMPRESSION: 1. Cholelithiasis without cholecystitis. 2. Cirrhosis without focal abnormality. Electronically Signed   By: Randa Ngo M.D.   On: 03/30/2021 14:59     Discharge Instructions: Discharge Instructions     Call MD for:  difficulty breathing, headache or visual disturbances   Complete by: As directed    Call MD for:  extreme fatigue   Complete by: As directed    Call MD for:  hives   Complete by: As directed    Call MD for:  persistant dizziness or light-headedness   Complete by: As directed    Call MD for:  persistant nausea and vomiting   Complete by: As directed    Call MD for:  severe uncontrolled pain   Complete by: As directed    Call MD for:  temperature >100.4   Complete by: As directed    Diet - low sodium heart healthy   Complete  by: As directed    Increase activity slowly   Complete by: As directed        Signed: Idamae Schuller, MD Internal Medicine [place], PGY1 Tillie Rung. [facility] [phone]

## 2021-04-01 DIAGNOSIS — K7682 Hepatic encephalopathy: Secondary | ICD-10-CM

## 2021-04-01 LAB — COMPREHENSIVE METABOLIC PANEL
ALT: 21 U/L (ref 0–44)
AST: 75 U/L — ABNORMAL HIGH (ref 15–41)
Albumin: 2.7 g/dL — ABNORMAL LOW (ref 3.5–5.0)
Alkaline Phosphatase: 125 U/L (ref 38–126)
Anion gap: 8 (ref 5–15)
BUN: 22 mg/dL — ABNORMAL HIGH (ref 6–20)
CO2: 23 mmol/L (ref 22–32)
Calcium: 8.5 mg/dL — ABNORMAL LOW (ref 8.9–10.3)
Chloride: 106 mmol/L (ref 98–111)
Creatinine, Ser: 1.15 mg/dL (ref 0.61–1.24)
GFR, Estimated: >60 mL/min (ref >60)
Glucose, Bld: 107 mg/dL — ABNORMAL HIGH (ref 70–99)
Potassium: 3.7 mmol/L (ref 3.5–5.1)
Sodium: 137 mmol/L (ref 135–145)
Total Bilirubin: 1.5 mg/dL — ABNORMAL HIGH (ref 0.3–1.2)
Total Protein: 6.4 g/dL — ABNORMAL LOW (ref 6.5–8.1)

## 2021-04-01 LAB — CBC
HCT: 32.6 % — ABNORMAL LOW (ref 39.0–52.0)
Hemoglobin: 10.5 g/dL — ABNORMAL LOW (ref 13.0–17.0)
MCH: 26.6 pg (ref 26.0–34.0)
MCHC: 32.2 g/dL (ref 30.0–36.0)
MCV: 82.7 fL (ref 80.0–100.0)
Platelets: 228 10*3/uL (ref 150–400)
RBC: 3.94 MIL/uL — ABNORMAL LOW (ref 4.22–5.81)
RDW: 18.8 % — ABNORMAL HIGH (ref 11.5–15.5)
WBC: 10.6 10*3/uL — ABNORMAL HIGH (ref 4.0–10.5)
nRBC: 0 % (ref 0.0–0.2)

## 2021-04-01 LAB — MAGNESIUM: Magnesium: 2.1 mg/dL (ref 1.7–2.4)

## 2021-04-01 MED ORDER — LACTULOSE 10 GM/15ML PO SOLN
30.0000 g | Freq: Three times a day (TID) | ORAL | Status: DC
  Administered 2021-04-01 – 2021-04-02 (×2): 30 g via ORAL
  Filled 2021-04-01 (×2): qty 45

## 2021-04-01 MED ORDER — LACTULOSE 10 GM/15ML PO SOLN
20.0000 g | ORAL | Status: AC
  Administered 2021-04-01 (×2): 20 g via ORAL
  Filled 2021-04-01 (×2): qty 30

## 2021-04-01 NOTE — Plan of Care (Signed)
  Problem: Clinical Measurements: Goal: Diagnostic test results will improve Outcome: Progressing Goal: Respiratory complications will improve Outcome: Progressing Goal: Cardiovascular complication will be avoided Outcome: Progressing   

## 2021-04-01 NOTE — Progress Notes (Signed)
   HD#1 SUBJECTIVE:  Patient Summary: Benjamin Knight is a 55 y.o. with a pertinent PMH of depression and bipolar disorder, and chronic abck pain, who presented with altered mental status and admitted for acute hepatic encephalopathy on hospital day 1.   Overnight Events: Mildly hypotensive overnight. Self-resolved in the AM.   Interim History:   This AM, Benjamin Knight reports he is feeling better than he has in the last 10 years. He is not experiencing any acute discomfort. His lower extremity edema is greatly improved. He notes only one bowel movement yesterday and none so far today. His wife at bedside states mentation is nearing baseline however still some deficits with memory.   Benjamin Knight denies any alcohol cravings. He is determined to stay abstinent.  OBJECTIVE:  Vital Signs: Vitals:   03/31/21 1938 03/31/21 2337 04/01/21 0400 04/01/21 0406  BP: 105/61 (!) 91/47 105/65   Pulse: 77 69 76   Resp: 15 16 20    Temp: 98 F (36.7 C) 98 F (36.7 C) 98 F (36.7 C)   TempSrc: Oral Oral Oral   SpO2: 90% 97% 95%   Weight:    77 kg  Height:       Supplemental O2: Room Air SpO2: 95 %  Filed Weights   03/31/21 0643 04/01/21 0406  Weight: 77 kg 77 kg     Intake/Output Summary (Last 24 hours) at 04/01/2021 6063 Last data filed at 03/31/2021 1546 Gross per 24 hour  Intake --  Output 150 ml  Net -150 ml    Net IO Since Admission: -1,650 mL [04/01/21 0634]  Physical Exam: General: well-Developed, well nourished HENT: NCAT Eyes: No scleral icterus, conjunctiva clear GI: No tenderness, no abdominal distention present Extremities: Minimal pitting edema of the bilateral lower extremities present around ankles only.  Skin: Warm and dry Neuro: Alert and oriented to person, place, time and situation. Psych: Normal mood and affect  ASSESSMENT/PLAN:  Assessment: Active Problems:   AMS (altered mental status)  Benjamin Knight is a 55 y.o. with a pertinent PMH of depression and  bipolar disorder, and chronic abck pain, who presented with altered mental status and admitted for acute hepatic encephalopathy secondary to new diagnosis of cirrhosis.  Plan:  # Hepatic Encephalopathy  # Cirrhosis MELD score of 24 and Benjamin Pugh Class B. Secondary to AUD, likely worsened by recent Depakote initiation. Will need to up-titrate Lactulose today.   - Will give 2 doses of Lactulose 20 g today to induce at least 2 bowel movements - Plan to continue Lactulose 30 mg TID with goal of 2-3 bowel movements daily afterwards - Continue spironolactone 50 mg - Outpatient follow up with GI  # Normocytic anemia Stable.   # Low A/G ratio A/G ratio of 0.7 on admission. Likely secondary to cirrhosis or celiac disease.   - Kappa/lamda light chain and protein electrophoresis pending  Best Practice: Diet: Cardiac diet IVF: Fluids: none VTE: rivaroxaban (XARELTO) tablet 10 mg Start: 03/30/21 1100 Code: Full AB: none Family Contact: wife updated at bedside DISPO: Anticipated discharge tomorrow to Home pending  improvement in mental status .  Signature: Dr. Jose Persia Internal Medicine PGY-3 Pager: [phone] After 5pm on weekdays and 1pm on weekends: On Call pager [phone]  04/01/2021, 6:36 AM

## 2021-04-02 LAB — COMPREHENSIVE METABOLIC PANEL
ALT: 26 U/L (ref 0–44)
AST: 98 U/L — ABNORMAL HIGH (ref 15–41)
Albumin: 3 g/dL — ABNORMAL LOW (ref 3.5–5.0)
Alkaline Phosphatase: 121 U/L (ref 38–126)
Anion gap: 10 (ref 5–15)
BUN: 17 mg/dL (ref 6–20)
CO2: 22 mmol/L (ref 22–32)
Calcium: 9.1 mg/dL (ref 8.9–10.3)
Chloride: 107 mmol/L (ref 98–111)
Creatinine, Ser: 1.06 mg/dL (ref 0.61–1.24)
GFR, Estimated: >60 mL/min (ref >60)
Glucose, Bld: 132 mg/dL — ABNORMAL HIGH (ref 70–99)
Potassium: 4.3 mmol/L (ref 3.5–5.1)
Sodium: 139 mmol/L (ref 135–145)
Total Bilirubin: 1.9 mg/dL — ABNORMAL HIGH (ref 0.3–1.2)
Total Protein: 6.7 g/dL (ref 6.5–8.1)

## 2021-04-02 LAB — PROTIME-INR
INR: 2.9 — ABNORMAL HIGH (ref 0.8–1.2)
Prothrombin Time: 30.3 seconds — ABNORMAL HIGH (ref 11.4–15.2)

## 2021-04-02 MED ORDER — PANTOPRAZOLE SODIUM 40 MG PO TBEC: 40.0000 mg | DELAYED_RELEASE_TABLET | Freq: Every day | ORAL | 0 refills | Status: DC

## 2021-04-02 MED ORDER — LACTULOSE 10 GM/15ML PO SOLN: 30.0000 g | Freq: Three times a day (TID) | ORAL | 0 refills | Status: DC

## 2021-04-02 MED ORDER — FOLIC ACID 1 MG PO TABS: 1.0000 mg | ORAL_TABLET | Freq: Every day | ORAL | 0 refills | Status: DC

## 2021-04-02 MED ORDER — THIAMINE HCL 100 MG PO TABS: 100.0000 mg | ORAL_TABLET | Freq: Every day | ORAL | 0 refills | Status: DC

## 2021-04-02 MED ORDER — SPIRONOLACTONE 50 MG PO TABS: 50.0000 mg | ORAL_TABLET | Freq: Every day | ORAL | 0 refills | Status: DC

## 2021-04-02 NOTE — Plan of Care (Signed)
Problem: Education: Goal: Knowledge of General Education information will improve Description: Including pain rating scale, medication(s)/side effects and non-pharmacologic comfort measures 04/02/2021 1108 by Sindy Messing, RN Outcome: Adequate for Discharge 04/02/2021 1108 by Sindy Messing, RN Outcome: Adequate for Discharge 04/02/2021 1108 by Sindy Messing, RN Outcome: Adequate for Discharge   Problem: Health Behavior/Discharge Planning: Goal: Ability to manage health-related needs will improve 04/02/2021 1108 by Sindy Messing, RN Outcome: Adequate for Discharge 04/02/2021 1108 by Sindy Messing, RN Outcome: Adequate for Discharge 04/02/2021 1108 by Sindy Messing, RN Outcome: Adequate for Discharge   Problem: Clinical Measurements: Goal: Ability to maintain clinical measurements within normal limits will improve 04/02/2021 1108 by Sindy Messing, RN Outcome: Adequate for Discharge 04/02/2021 1108 by Sindy Messing, RN Outcome: Adequate for Discharge 04/02/2021 1108 by Sindy Messing, RN Outcome: Adequate for Discharge Goal: Will remain free from infection 04/02/2021 1108 by Sindy Messing, RN Outcome: Adequate for Discharge 04/02/2021 1108 by Sindy Messing, RN Outcome: Adequate for Discharge 04/02/2021 1108 by Sindy Messing, RN Outcome: Adequate for Discharge Goal: Diagnostic test results will improve 04/02/2021 1108 by Sindy Messing, RN Outcome: Adequate for Discharge 04/02/2021 1108 by Sindy Messing, RN Outcome: Adequate for Discharge 04/02/2021 1108 by Sindy Messing, RN Outcome: Adequate for Discharge Goal: Respiratory complications will improve 04/02/2021 1108 by Sindy Messing, RN Outcome: Adequate for Discharge 04/02/2021 1108 by Sindy Messing, RN Outcome: Adequate for Discharge 04/02/2021 1108 by Sindy Messing, RN Outcome: Adequate for Discharge Goal: Cardiovascular complication will be avoided 04/02/2021 1108 by Sindy Messing, RN Outcome: Adequate for Discharge 04/02/2021 1108 by Sindy Messing, RN Outcome: Adequate  for Discharge 04/02/2021 1108 by Sindy Messing, RN Outcome: Adequate for Discharge   Problem: Activity: Goal: Risk for activity intolerance will decrease 04/02/2021 1108 by Sindy Messing, RN Outcome: Adequate for Discharge 04/02/2021 1108 by Sindy Messing, RN Outcome: Adequate for Discharge 04/02/2021 1108 by Sindy Messing, RN Outcome: Adequate for Discharge   Problem: Nutrition: Goal: Adequate nutrition will be maintained 04/02/2021 1108 by Sindy Messing, RN Outcome: Adequate for Discharge 04/02/2021 1108 by Sindy Messing, RN Outcome: Adequate for Discharge 04/02/2021 1108 by Sindy Messing, RN Outcome: Adequate for Discharge   Problem: Coping: Goal: Level of anxiety will decrease 04/02/2021 1108 by Sindy Messing, RN Outcome: Adequate for Discharge 04/02/2021 1108 by Sindy Messing, RN Outcome: Adequate for Discharge 04/02/2021 1108 by Sindy Messing, RN Outcome: Adequate for Discharge   Problem: Elimination: Goal: Will not experience complications related to bowel motility 04/02/2021 1108 by Sindy Messing, RN Outcome: Adequate for Discharge 04/02/2021 1108 by Sindy Messing, RN Outcome: Adequate for Discharge 04/02/2021 1108 by Sindy Messing, RN Outcome: Adequate for Discharge Goal: Will not experience complications related to urinary retention 04/02/2021 1108 by Sindy Messing, RN Outcome: Adequate for Discharge 04/02/2021 1108 by Sindy Messing, RN Outcome: Adequate for Discharge 04/02/2021 1108 by Sindy Messing, RN Outcome: Adequate for Discharge   Problem: Pain Managment: Goal: General experience of comfort will improve 04/02/2021 1108 by Sindy Messing, RN Outcome: Adequate for Discharge 04/02/2021 1108 by Sindy Messing, RN Outcome: Adequate for Discharge 04/02/2021 1108 by Sindy Messing, RN Outcome: Adequate for Discharge   Problem: Safety: Goal: Ability to remain free from injury will improve 04/02/2021 1108 by Sindy Messing, RN Outcome: Adequate for Discharge 04/02/2021 1108 by Sindy Messing, RN Outcome: Adequate  for Discharge 04/02/2021 1108 by Sindy Messing, RN Outcome: Adequate for Discharge   Problem: Skin Integrity: Goal: Risk for impaired skin integrity will decrease 04/02/2021 1108 by Sindy Messing, RN Outcome: Adequate for Discharge 04/02/2021 1108 by  Sindy Messing, RN Outcome: Adequate for Discharge 04/02/2021 1108 by Sindy Messing, RN Outcome: Adequate for Discharge

## 2021-04-02 NOTE — Discharge Instructions (Signed)
Mr. Benjamin Knight, Benjamin Knight were admitted to the hospital for altered mental status that was due to ammonia build up. This is called Hepatic Encephalopathy and is caused by cirrhosis. The treatment is Lactulose, which helps clear the ammonia. Please take it three times day. You can titrate to a goal of 2-3 bowel movements per day. If you are having less than goal, increase dose to four times per day. If you are having more than goal, decrease to two times per day.   You were also started on Spironolactone to help with the fluid build up that can occur with liver disease as well.   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 At this time, you MELD-Na score is 22 and Child Pugh class is B (just details to have in case you are curious).  It will be very important to establish with a Gastroenterologist (GI). You can make an appointment with the GI doctor who did your endoscopy and colonoscopy earlier this year.   Most important will be to abstain from alcohol. If you have trouble with cravings or relapse, please reach out to your primary care doctor for resources and help.   It was a pleasure meeting you and your wife. We wish you the best!   - Dr. Charleen Kirks

## 2021-04-02 NOTE — Progress Notes (Signed)
Physical Therapy Treatment and Discharge Patient Details Name: Benjamin Knight MRN: [id number] DOB: 09-21-1965 Today's Date: 04/02/2021   History of Present Illness 55 y.o. male who presented to [facility] with altered mental status. Hepatic encephalopathy vs Depakote toxicity suspected. MRI brain negative. PMH: depression and bipolar disorder, and chronic back pain.    PT Comments    Patient awake and alert on arrival. Mobilizing better than previous session with pt demonstrating independence with bed mobility, transfers, gait and stairs. Goals met and no further need for PT.      Recommendations for follow up therapy are one component of a multi-disciplinary discharge planning process, led by the attending physician.  Recommendations may be updated based on patient status, additional functional criteria and insurance authorization.  Follow Up Recommendations  No PT follow up;Supervision - Intermittent     Equipment Recommendations  None recommended by PT    Recommendations for Other Services       Precautions / Restrictions Precautions Precautions: Fall     Mobility  Bed Mobility Overal bed mobility: Modified Independent                  Transfers Overall transfer level: Independent Equipment used: None Transfers: Sit to/from Stand Sit to Stand: Supervision;Independent            Ambulation/Gait Ambulation/Gait assistance: Independent Gait Distance (Feet): 300 Feet Assistive device: None Gait Pattern/deviations: Step-through pattern;Wide base of support Gait velocity: varying speed. WFL       Stairs   Stairs assistance: Modified independent (Device/Increase time) Stair Management: One rail Right;Forwards;Alternating pattern Number of Stairs: 10     Wheelchair Mobility    Modified Rankin (Stroke Patients Only)       Balance Overall balance assessment: Independent                                          Cognition  Arousal/Alertness: Awake/alert Behavior During Therapy: WFL for tasks assessed/performed Overall Cognitive Status: Impaired/Different from baseline Area of Impairment: Following commands                       Following Commands: Follows multi-step commands with increased time     Problem Solving: Slow processing        Exercises      General Comments General comments (skin integrity, edema, etc.): VSS on RA on monitor throughout      Pertinent Vitals/Pain Pain Assessment: No/denies pain    Home Living                      Prior Function            PT Goals (current goals can now be found in the care plan section) Acute Rehab PT Goals Patient Stated Goal: home PT Goal Formulation: With patient Time For Goal Achievement: 04/14/21 Potential to Achieve Goals: Good Progress towards PT goals: Goals met/education completed, patient discharged from PT    Frequency    Min 3X/week      PT Plan Current plan remains appropriate    Co-evaluation              AM-PAC PT "6 Clicks" Mobility   Outcome Measure  Help needed turning from your back to your side while in a flat bed without using bedrails?: None Help needed moving from lying on your back  to sitting on the side of a flat bed without using bedrails?: None Help needed moving to and from a bed to a chair (including a wheelchair)?: None Help needed standing up from a chair using your arms (e.g., wheelchair or bedside chair)?: None Help needed to walk in hospital room?: None Help needed climbing 3-5 steps with a railing? : None 6 Click Score: 24    End of Session Equipment Utilized During Treatment: Gait belt Activity Tolerance: Patient tolerated treatment well Patient left: in bed;with call bell/phone within reach;with family/visitor present   PT Visit Diagnosis: Unsteadiness on feet (R26.81)   PT Discharge Note  Patient is being discharged from PT services secondary to:  Goals met  and no further therapy needs identified.  Please see latest Therapy Progress Note for current level of functioning and progress toward goals.  Progress and discharge plan and discussed with patient/caregiver and they  Agree   Time: 4718-5501 PT Time Calculation (min) (ACUTE ONLY): 13 min  Charges:  $Gait Training: 8-22 mins                      Arby Barrette, PT Pager [phone]    Rexanne Mano 04/02/2021, 9:45 AM

## 2021-04-03 LAB — KAPPA/LAMBDA LIGHT CHAINS
Kappa free light chain: 109.8 mg/L — ABNORMAL HIGH (ref 3.3–19.4)
Kappa, lambda light chain ratio: 3.13 — ABNORMAL HIGH (ref 0.26–1.65)
Lambda free light chains: 35.1 mg/L — ABNORMAL HIGH (ref 5.7–26.3)

## 2021-04-04 DIAGNOSIS — K7460 Unspecified cirrhosis of liver: Secondary | ICD-10-CM | POA: Diagnosis not present

## 2021-04-04 DIAGNOSIS — R188 Other ascites: Secondary | ICD-10-CM | POA: Diagnosis not present

## 2021-04-04 LAB — PROTEIN ELECTROPHORESIS, SERUM
A/G Ratio: 0.8 (ref 0.7–1.7)
Albumin ELP: 3 g/dL (ref 2.9–4.4)
Alpha-1-Globulin: 0.2 g/dL (ref 0.0–0.4)
Alpha-2-Globulin: 0.5 g/dL (ref 0.4–1.0)
Beta Globulin: 1.2 g/dL (ref 0.7–1.3)
Gamma Globulin: 1.8 g/dL (ref 0.4–1.8)
Globulin, Total: 3.8 g/dL (ref 2.2–3.9)
M-Spike, %: 1.2 g/dL — ABNORMAL HIGH
Total Protein ELP: 6.8 g/dL (ref 6.0–8.5)

## 2021-04-05 ENCOUNTER — Telehealth: Payer: Self-pay | Admitting: Gastroenterology

## 2021-04-05 NOTE — Telephone Encounter (Signed)
Patients wife called requesting a transfer of care with Dr. Tarri Glenn sending records for review.

## 2021-04-07 DIAGNOSIS — F1011 Alcohol abuse, in remission: Secondary | ICD-10-CM | POA: Diagnosis not present

## 2021-04-07 DIAGNOSIS — K7030 Alcoholic cirrhosis of liver without ascites: Secondary | ICD-10-CM | POA: Diagnosis not present

## 2021-04-12 ENCOUNTER — Telehealth: Payer: Self-pay | Admitting: Physician Assistant

## 2021-04-12 NOTE — Telephone Encounter (Signed)
Scheduled appt per 10/12 referral. Pt is aware of appt date and time.  

## 2021-04-14 DIAGNOSIS — Z23 Encounter for immunization: Secondary | ICD-10-CM | POA: Diagnosis not present

## 2021-04-21 ENCOUNTER — Telehealth: Payer: Self-pay | Admitting: Physician Assistant

## 2021-04-21 ENCOUNTER — Inpatient Hospital Stay: Payer: BC Managed Care – PPO | Admitting: Physician Assistant

## 2021-04-21 ENCOUNTER — Encounter: Payer: Self-pay | Admitting: Physician Assistant

## 2021-04-21 ENCOUNTER — Telehealth: Payer: Self-pay

## 2021-04-21 ENCOUNTER — Other Ambulatory Visit: Payer: Self-pay

## 2021-04-21 ENCOUNTER — Inpatient Hospital Stay: Payer: BC Managed Care – PPO | Attending: Physician Assistant

## 2021-04-21 VITALS — BP 130/77 | HR 83 | Temp 97.6°F | Resp 18 | Ht 69.0 in | Wt 184.6 lb

## 2021-04-21 DIAGNOSIS — K7460 Unspecified cirrhosis of liver: Secondary | ICD-10-CM | POA: Insufficient documentation

## 2021-04-21 DIAGNOSIS — M79672 Pain in left foot: Secondary | ICD-10-CM | POA: Insufficient documentation

## 2021-04-21 DIAGNOSIS — L299 Pruritus, unspecified: Secondary | ICD-10-CM | POA: Diagnosis not present

## 2021-04-21 DIAGNOSIS — D509 Iron deficiency anemia, unspecified: Secondary | ICD-10-CM | POA: Insufficient documentation

## 2021-04-21 DIAGNOSIS — F1090 Alcohol use, unspecified, uncomplicated: Secondary | ICD-10-CM | POA: Diagnosis not present

## 2021-04-21 DIAGNOSIS — R768 Other specified abnormal immunological findings in serum: Secondary | ICD-10-CM | POA: Insufficient documentation

## 2021-04-21 DIAGNOSIS — R233 Spontaneous ecchymoses: Secondary | ICD-10-CM | POA: Insufficient documentation

## 2021-04-21 DIAGNOSIS — M79671 Pain in right foot: Secondary | ICD-10-CM | POA: Diagnosis not present

## 2021-04-21 DIAGNOSIS — K7682 Hepatic encephalopathy: Secondary | ICD-10-CM | POA: Insufficient documentation

## 2021-04-21 DIAGNOSIS — Z87891 Personal history of nicotine dependence: Secondary | ICD-10-CM | POA: Diagnosis not present

## 2021-04-21 DIAGNOSIS — Z79899 Other long term (current) drug therapy: Secondary | ICD-10-CM | POA: Diagnosis not present

## 2021-04-21 DIAGNOSIS — M545 Low back pain, unspecified: Secondary | ICD-10-CM | POA: Insufficient documentation

## 2021-04-21 DIAGNOSIS — D508 Other iron deficiency anemias: Secondary | ICD-10-CM

## 2021-04-21 DIAGNOSIS — D472 Monoclonal gammopathy: Secondary | ICD-10-CM

## 2021-04-21 LAB — CMP (CANCER CENTER ONLY)
ALT: 23 U/L (ref 0–44)
AST: 71 U/L — ABNORMAL HIGH (ref 15–41)
Albumin: 3.3 g/dL — ABNORMAL LOW (ref 3.5–5.0)
Alkaline Phosphatase: 215 U/L — ABNORMAL HIGH (ref 38–126)
Anion gap: 7 (ref 5–15)
BUN: 14 mg/dL (ref 6–20)
CO2: 21 mmol/L — ABNORMAL LOW (ref 22–32)
Calcium: 8.6 mg/dL — ABNORMAL LOW (ref 8.9–10.3)
Chloride: 108 mmol/L (ref 98–111)
Creatinine: 1.1 mg/dL (ref 0.61–1.24)
GFR, Estimated: >60 mL/min (ref >60)
Glucose, Bld: 103 mg/dL — ABNORMAL HIGH (ref 70–99)
Potassium: 3.6 mmol/L (ref 3.5–5.1)
Sodium: 136 mmol/L (ref 135–145)
Total Bilirubin: 1.1 mg/dL (ref 0.3–1.2)
Total Protein: 7.5 g/dL (ref 6.5–8.1)

## 2021-04-21 LAB — CBC WITH DIFFERENTIAL (CANCER CENTER ONLY)
Abs Immature Granulocytes: 0.02 10*3/uL (ref 0.00–0.07)
Basophils Absolute: 0.1 10*3/uL (ref 0.0–0.1)
Basophils Relative: 1 %
Eosinophils Absolute: 0.3 10*3/uL (ref 0.0–0.5)
Eosinophils Relative: 4 %
HCT: 30.9 % — ABNORMAL LOW (ref 39.0–52.0)
Hemoglobin: 10.1 g/dL — ABNORMAL LOW (ref 13.0–17.0)
Immature Granulocytes: 0 %
Lymphocytes Relative: 21 %
Lymphs Abs: 1.9 10*3/uL (ref 0.7–4.0)
MCH: 26.6 pg (ref 26.0–34.0)
MCHC: 32.7 g/dL (ref 30.0–36.0)
MCV: 81.3 fL (ref 80.0–100.0)
Monocytes Absolute: 1.8 10*3/uL — ABNORMAL HIGH (ref 0.1–1.0)
Monocytes Relative: 20 %
Neutro Abs: 4.8 10*3/uL (ref 1.7–7.7)
Neutrophils Relative %: 54 %
Platelet Count: 155 10*3/uL (ref 150–400)
RBC: 3.8 MIL/uL — ABNORMAL LOW (ref 4.22–5.81)
RDW: 18.4 % — ABNORMAL HIGH (ref 11.5–15.5)
WBC Count: 8.9 10*3/uL (ref 4.0–10.5)
nRBC: 0 % (ref 0.0–0.2)

## 2021-04-21 LAB — LACTATE DEHYDROGENASE: LDH: 159 U/L (ref 98–192)

## 2021-04-21 MED ORDER — FERROUS SULFATE 325 (65 FE) MG PO TBEC: 325.0000 mg | DELAYED_RELEASE_TABLET | Freq: Every day | ORAL | 3 refills | Status: DC

## 2021-04-21 NOTE — Telephone Encounter (Signed)
Pt contacted per Dede Query, PA to be scheduled for [facility]. Pt agreed to come in 05/04/21 at 0730 and knows to arrive to appt at [facility] for check in. Infusion, flow cytometry and scheduling made aware, and will also add lab appt to visit. Wilber Bihari, NP notified of [facility].

## 2021-04-21 NOTE — Telephone Encounter (Signed)
Scheduled per 10/21 in basket, message was left with pt

## 2021-04-21 NOTE — Progress Notes (Signed)
[address] Telephone:([phone]) [phone]   Fax:([phone]) [place] NOTE  Patient Care Team: Johna Roles, PA as PCP - General (Internal Medicine)  Hematological/Oncological History 1) 03/31/2021:  -SPEP detected M-protein 1.2 g/dL. Serum free light chains showed kappa light chain 109.8 mg/L, lambda light chains 35.1 mg/L and elevated ratio of 3.13.  -Vitamin B12 level 1,095, Ferritin 27, Iron 72, TIBC 514 (H), Iron saturation 14% (L), folate 12.8.  2) 04/01/2021: WBC 10.6 (H), Hgb 10.5 (L), MCV 82.7, Plt 228, Creatinine 1.15, Calcium 8.5 (L).   3) 04/21/2021: Establish care with [facility]emat[facility]  CHIEF COMPLAINTS/PURPOSE OF CONSULTATION:  "Monoclonal gammopathy "  HISTORY OF PRESENTING ILLNESS:  Benjamin Knight 55 y.o. male with medical history significant for depression and bipolar disorder, chronic back pain and cirrhosis.   On exam today, Benjamin Knight reports reports that his energy levels have improved since hospitalization.  He does continue to feel fatigued has been working full-time.  He adds that he feels worn out towards the end of the day.  He has a good appetite and denies any recent weight changes.  He denies any nausea, vomiting or abdominal pain.  He reports having 2-3 bowel movements per day since starting on lactulose 3 times daily.  He reports bruising mainly in the arms and occasional episodes of nosebleeds that stopped fairly quickly.  Patient reports persistent bilateral feet pain and accompanied low back pain that has been present for the last 2 to 3 months.  He denies any neuropathy to the lower extremities.  Patient adds that he recently stopped the use of smokeless tobacco.  He notes having episodes of pruritus.  He denies any fevers, chills, night sweats, shortness of breath, chest pain or cough.  He has no other complaints.  Rest of the 10 point ROS is below.   MEDICAL HISTORY:  Past Medical History:  Diagnosis Date   Bipolar  disorder ([place])    Chronic back pain    Cirrhosis ([place])    Depression     SURGICAL HISTORY: History reviewed. No pertinent surgical history.  SOCIAL HISTORY: Social History   Socioeconomic History   Marital status: Married    Spouse name: Not on file   Number of children: Not on file   Years of education: Not on file   Highest education level: Not on file  Occupational History   Not on file  Tobacco Use   Smoking status: Never   Smokeless tobacco: Current    Types: Chew  Substance and Sexual Activity   Alcohol use: Yes    Comment: Per wife, he has an extensive history of alcohol use with prior attempts of quitting. However, recently noted to be drinking up to 1/5 liquor on weekends and half of 1/5 liquor daily on weekdays until Labor day weekend.   Drug use: Never   Sexual activity: Not on file  Other Topics Concern   Not on file  Social History Narrative   Not on file   Social Determinants of Health   Financial Resource Strain: Not on file  Food Insecurity: Not on file  Transportation Needs: Not on file  Physical Activity: Not on file  Stress: Not on file  Social Connections: Not on file  Intimate Partner Violence: Not on file    FAMILY HISTORY: Family History  Problem Relation Age of Onset   Heart disease Mother     ALLERGIES:  has No Known Allergies.  MEDICATIONS:  Current Outpatient Medications  Medication Sig Dispense Refill   escitalopram (LEXAPRO) 10 MG tablet Take 5 mg by mouth in the morning.     folic acid (FOLVITE) 1 MG tablet Take 1 tablet (1 mg total) by mouth daily. 30 tablet 0   lactulose (CHRONULAC) 10 GM/15ML solution Take 45 mLs (30 g total) by mouth 3 (three) times daily. Titrate to a goal of 2-3 bowel movements per day. 3784 mL 0   pantoprazole (PROTONIX) 40 MG tablet Take 1 tablet (40 mg total) by mouth daily. 30 tablet 0   PEPCID 20 MG tablet Take 10-20 mg by mouth 2 (two) times daily as needed for heartburn or indigestion.      spironolactone (ALDACTONE) 50 MG tablet Take 1 tablet (50 mg total) by mouth daily. 30 tablet 0   thiamine 100 MG tablet Take 1 tablet (100 mg total) by mouth daily. 30 tablet 0   No current facility-administered medications for this visit.    REVIEW OF SYSTEMS:   Constitutional: ( - ) fevers, ( - )  chills , ( - ) night sweats Eyes: ( - ) blurriness of vision, ( - ) double vision, ( - ) watery eyes Ears, nose, mouth, throat, and face: ( - ) mucositis, ( - ) sore throat Respiratory: ( - ) cough, ( - ) dyspnea, ( - ) wheezes Cardiovascular: ( - ) palpitation, ( - ) chest discomfort, ( + ) lower extremity swelling Gastrointestinal:  ( - ) nausea, ( - ) heartburn, ( + ) change in bowel habits Skin: ( - ) abnormal skin rashes Lymphatics: ( - ) new lymphadenopathy, ( - ) easy bruising Neurological: ( - ) numbness, ( - ) tingling, ( - ) new weaknesses Behavioral/Psych: ( - ) mood change, ( - ) new changes  All other systems were reviewed with the patient and are negative.  PHYSICAL EXAMINATION: ECOG PERFORMANCE STATUS: 1 - Symptomatic but completely ambulatory  Vitals:   04/21/21 1101  BP: 130/77  Pulse: 83  Resp: 18  Temp: 97.6 F (36.4 C)  SpO2: 100%   Filed Weights   04/21/21 1101  Weight: 184 lb 9.6 oz (83.7 kg)    GENERAL: well appearing male in NAD  SKIN: skin color, texture, turgor are normal, no rashes or significant lesions EYES: conjunctiva are pink and non-injected, sclera clear OROPHARYNX: no exudate, no erythema; lips, buccal mucosa, and tongue normal  NECK: supple, non-tender LYMPH:  no palpable lymphadenopathy in the cervical, axillary or supraclavicular lymph nodes.  LUNGS: clear to auscultation and percussion with normal breathing effort HEART: regular rate & rhythm and no murmurs. Bilateral lower extremity edema 2+ ABDOMEN: soft, non-tender, non-distended, normal bowel sounds Musculoskeletal: no cyanosis of digits and no clubbing  PSYCH: alert & oriented x 3,  fluent speech NEURO: no focal motor/sensory deficits  LABORATORY DATA:  I have reviewed the data as listed CBC Latest Ref Rng & Units 04/21/2021 04/01/2021 03/31/2021  WBC 4.0 - 10.5 K/uL 8.9 10.6(H) 9.3  Hemoglobin 13.0 - 17.0 g/dL 10.1(L) 10.5(L) 11.1(L)  Hematocrit 39.0 - 52.0 % 30.9(L) 32.6(L) 35.6(L)  Platelets 150 - 400 K/uL 155 228 238    CMP Latest Ref Rng & Units 04/02/2021 04/01/2021 03/31/2021  Glucose 70 - 99 mg/dL 132(H) 107(H) -  BUN 6 - 20 mg/dL 17 22(H) -  Creatinine 0.61 - 1.24 mg/dL 1.06 1.15 -  Sodium 135 - 145 mmol/L 139 137 -  Potassium 3.5 - 5.1 mmol/L 4.3 3.7 -  Chloride 98 -  111 mmol/L 107 106 -  CO2 22 - 32 mmol/L 22 23 -  Calcium 8.9 - 10.3 mg/dL 9.1 8.5(L) -  Total Protein 6.5 - 8.1 g/dL 6.7 6.4(L) -  Total Bilirubin 0.3 - 1.2 mg/dL 1.9(H) 1.5(H) 1.6(H)  Alkaline Phos 38 - 126 U/L 121 125 -  AST 15 - 41 U/L 98(H) 75(H) -  ALT 0 - 44 U/L 26 21 -    RADIOGRAPHIC STUDIES: I have personally reviewed the radiological images as listed and agreed with the findings in the report. DG Chest 2 View  Result Date: 03/30/2021 CLINICAL DATA:  Altered mental status EXAM: CHEST - 2 VIEW COMPARISON:  None. FINDINGS: Criteria and mediastinal contours within normal limits for AP technique. Lungs are clear. Pleural effusion or pneumothorax. IMPRESSION: No active cardiopulmonary disease. Electronically Signed   By: Yetta Glassman M.D.   On: 03/30/2021 08:58   CT Head Wo Contrast  Result Date: 03/30/2021 CLINICAL DATA:  Mental status changes EXAM: CT HEAD WITHOUT CONTRAST TECHNIQUE: Contiguous axial images were obtained from the base of the skull through the vertex without intravenous contrast. COMPARISON:  None. FINDINGS: Brain: No acute intracranial abnormality. Specifically, no hemorrhage, hydrocephalus, mass lesion, acute infarction, or significant intracranial injury. Vascular: No hyperdense vessel or unexpected calcification. Skull: No acute calvarial abnormality.  Sinuses/Orbits: No acute findings Other: None IMPRESSION: No acute intracranial abnormality. Electronically Signed   By: Rolm Baptise M.D.   On: 03/30/2021 09:21   MR BRAIN WO CONTRAST  Result Date: 03/30/2021 CLINICAL DATA:  Acute neuro deficit. EXAM: MRI HEAD WITHOUT CONTRAST TECHNIQUE: Multiplanar, multiecho pulse sequences of the brain and surrounding structures were obtained without intravenous contrast. COMPARISON:  CT head 03/30/2021 FINDINGS: Brain: Limited study. The patient was moving. Images degraded by motion. Patient not able to complete the study which was terminated early Diffusion-weighted imaging demonstrates no acute infarct. Ventricle size and cerebral volume normal. No significant chronic ischemia. No mass or fluid collection. Vascular: Normal arterial flow voids. Skull and upper cervical spine: Negative Sinuses/Orbits: Mild mucosal edema paranasal sinuses. Negative orbit Other: None IMPRESSION: Limited study due to motion.  Patient not able to complete the exam No acute abnormality identified. Electronically Signed   By: Franchot Gallo M.D.   On: 03/30/2021 13:18   ECHOCARDIOGRAM COMPLETE  Result Date: 03/30/2021    ECHOCARDIOGRAM REPORT   Patient Name:   JIHAD BROWNLOW Date of Exam: 03/30/2021 Medical Rec #:  [id number]        Height:       69.0 in Accession #:    [id number]       Weight:       227.2 lb Date of Birth:  1965/07/21        BSA:          2.181 m Patient Age:    52 years         BP:           121/79 mmHg Patient Gender: M                HR:           62 bpm. Exam Location:  Inpatient Procedure: 2D Echo, Cardiac Doppler and Color Doppler Indications:    Cardiomyopathy  History:        Patient has no prior history of Echocardiogram examinations.  Sonographer:    Maudry Mayhew MHA, RDMS, RVT, RDCS Referring Phys: [id number] [facility] NARENDRA  Sonographer Comments: Image acquisition challenging due to uncooperative patient,  Image acquisition challenging due to respiratory  motion and patient unable to lie still. IMPRESSIONS  1. Left ventricular ejection fraction, by estimation, is 55 to 60%. The left ventricle has normal function. The left ventricle has no regional wall motion abnormalities. Left ventricular diastolic parameters were normal.  2. Right ventricular systolic function is normal. The right ventricular size is normal. There is normal pulmonary artery systolic pressure. The estimated right ventricular systolic pressure is 56.3 mmHg.  3. The mitral valve is normal in structure. No evidence of mitral valve regurgitation. No evidence of mitral stenosis.  4. The aortic valve is tricuspid. Aortic valve regurgitation is not visualized. No aortic stenosis is present. Comparison(s): No prior Echocardiogram. FINDINGS  Left Ventricle: Left ventricular ejection fraction, by estimation, is 55 to 60%. The left ventricle has normal function. The left ventricle has no regional wall motion abnormalities. The left ventricular internal cavity size was normal in size. There is  no left ventricular hypertrophy. Left ventricular diastolic parameters were normal. Right Ventricle: The right ventricular size is normal. No increase in right ventricular wall thickness. Right ventricular systolic function is normal. There is normal pulmonary artery systolic pressure. The tricuspid regurgitant velocity is 2.09 m/s, and  with an assumed right atrial pressure of 3 mmHg, the estimated right ventricular systolic pressure is 14.9 mmHg. Left Atrium: Left atrial size was normal in size. Right Atrium: Right atrial size was normal in size. Pericardium: There is no evidence of pericardial effusion. Mitral Valve: The mitral valve is normal in structure. No evidence of mitral valve regurgitation. No evidence of mitral valve stenosis. Tricuspid Valve: The tricuspid valve is normal in structure. Tricuspid valve regurgitation is mild . No evidence of tricuspid stenosis. Aortic Valve: The aortic valve is tricuspid.  Aortic valve regurgitation is not visualized. No aortic stenosis is present. Aortic valve mean gradient measures 7.0 mmHg. Aortic valve peak gradient measures 12.7 mmHg. Aortic valve area, by VTI measures 2.23  cm. Pulmonic Valve: The pulmonic valve was not well visualized. Pulmonic valve regurgitation is not visualized. No evidence of pulmonic stenosis. Aorta: The aortic root is normal in size and structure. IAS/Shunts: The atrial septum is grossly normal.  LEFT VENTRICLE PLAX 2D LVIDd:         4.50 cm      Diastology LVIDs:         3.20 cm      LV e' medial:    7.40 cm/s LV PW:         0.90 cm      LV E/e' medial:  14.3 LV IVS:        0.70 cm      LV e' lateral:   11.90 cm/s LVOT diam:     2.00 cm      LV E/e' lateral: 8.9 LV SV:         72 LV SV Index:   33 LVOT Area:     3.14 cm  LV Volumes (MOD) LV vol d, MOD A2C: 69.7 ml LV vol d, MOD A4C: 129.0 ml LV vol s, MOD A2C: 25.7 ml LV vol s, MOD A4C: 55.5 ml LV SV MOD A2C:     44.0 ml LV SV MOD A4C:     129.0 ml LV SV MOD BP:      58.0 ml RIGHT VENTRICLE RV S prime:     16.80 cm/s TAPSE (M-mode): 2.1 cm LEFT ATRIUM           Index  RIGHT ATRIUM           Index LA diam:      3.70 cm 1.70 cm/m  RA Area:     12.50 cm LA Vol (A4C): 55.8 ml 25.58 ml/m RA Volume:   24.30 ml  11.14 ml/m  AORTIC VALVE AV Area (Vmax):    2.63 cm AV Area (Vmean):   3.16 cm AV Area (VTI):     2.23 cm AV Vmax:           178.00 cm/s AV Vmean:          100.500 cm/s AV VTI:            0.324 m AV Peak Grad:      12.7 mmHg AV Mean Grad:      7.0 mmHg LVOT Vmax:         149.00 cm/s LVOT Vmean:        101.000 cm/s LVOT VTI:          0.230 m LVOT/AV VTI ratio: 0.71 MITRAL VALVE                TRICUSPID VALVE MV Area (PHT): 2.42 cm     TR Peak grad:   17.5 mmHg MV Decel Time: 314 msec     TR Vmax:        209.00 cm/s MV E velocity: 106.00 cm/s MV A velocity: 96.10 cm/s   SHUNTS MV E/A ratio:  1.10         Systemic VTI:  0.23 m                             Systemic Diam: 2.00 cm Rudean Haskell MD Electronically signed by Rudean Haskell MD Signature Date/Time: 03/30/2021/4:53:36 PM    Final    US Abdomen Limited RUQ (LIVER/GB)  Result Date: 03/30/2021 CLINICAL DATA:  Cirrhosis, elevated bilirubin EXAM: ULTRASOUND ABDOMEN LIMITED RIGHT UPPER QUADRANT COMPARISON:  None. FINDINGS: Gallbladder: There is a small 3 mm calculus dependent within the gallbladder. No gallbladder wall thickening or pericholecystic fluid. Negative sonographic Murphy sign. Common bile duct: Diameter: 2 mm Liver: Heterogeneous echotexture of the liver parenchyma compatible with given history of cirrhosis. No focal liver abnormality or intrahepatic duct dilation. Portal vein is patent on color Doppler imaging with normal direction of blood flow towards the liver. Other: None. IMPRESSION: 1. Cholelithiasis without cholecystitis. 2. Cirrhosis without focal abnormality. Electronically Signed   By: Randa Ngo M.D.   On: 03/30/2021 14:59    ASSESSMENT & PLAN EURIAH MATLACK is a 55 y.o. male who presents to the clinic for monoclonal gammopathy. Based on initial SPEP from 03/31/2021, there is evidence of M protein of 1.2 g/dL. Serum free light chain shows elevated kappa and lambda chains with ratio of 3.13.   Recommend complete workup with CBC, CMP, SPEP with IFE, kappa/lambda light chain, UPEP, beta 2 microglobulin and LDH levels. DG bone met survey will be obtained to evaluate for lytic lesions. Additionally, patient meets criteria for a bone marrow biopsy which will be schedule for 05/04/2021.     #Monoclonal gammopathy: --Recommend complete workup with CBC, CMP, SPEP/IFE,  kappa/lambda light chain, UPEP,beta 2 microglobulin and LDH levels.  --Need to obtain DG bone met surgery to evaluate for lytic lesions.  --Need bone marrow biopsy due to elevated serum free light chain ratio of 3.13. Scheduled for 05/04/2021.  --RTC in 3 weeks to see Dr. Lorenso Courier to review  pathology results.    #Cirrhosis: --Diagnosed recently in September 2022 after presenting with AMS due to acute hepatic encephalopathy.  --Currently on Lactulose TID. --Patient is scheduled for consultation at [facility] on 06/08/2021.  #Iron deficiency anemia: --Iron panel from 03/31/2021 shows iron deficiency with iron saturation of 14%,  ferritin of 27.  --Recommend ferrous sulfate 325 mg once daily. Sent prescription to pharmacy on file.  --Discussed with the patient to discuss the need for EGD/colonoscopy to rule out GI bleed, especially esophageal varices in the setting of cirrhosis. Patient will discuss with his gastroenterologist with his upcoming appt.   Orders Placed This Encounter  Procedures   DG Bone Survey Met    Standing Status:   Future    Standing Expiration Date:   04/21/2022    Order Specific Question:   Reason for Exam (SYMPTOM  OR DIAGNOSIS REQUIRED)    Answer:   evaluate for lytic lesions    Order Specific Question:   Preferred imaging location?    Answer:   [facility]   CBC with Differential ([place] Only)    Standing Status:   Future    Number of Occurrences:   1    Standing Expiration Date:   04/21/2022   CMP ([place] only)    Standing Status:   Future    Number of Occurrences:   1    Standing Expiration Date:   04/21/2022   Lactate dehydrogenase (LDH)    Standing Status:   Future    Number of Occurrences:   1    Standing Expiration Date:   04/21/2022   Beta 2 microglobulin    Standing Status:   Future    Number of Occurrences:   1    Standing Expiration Date:   04/21/2022   24-Hr Ur UPEP/UIFE/Light Chains/TP    Standing Status:   Future    Standing Expiration Date:   04/21/2022   Multiple Myeloma Panel (SPEP&IFE w/QIG)    Standing Status:   Future    Number of Occurrences:   1    Standing Expiration Date:   04/21/2022   Kappa/lambda light chains    Standing Status:   Future    Number of Occurrences:   1    Standing Expiration Date:    04/21/2022    All questions were answered. The patient knows to call the clinic with any problems, questions or concerns.  I have spent a total of 60 minutes minutes of face-to-face and non-face-to-face time, preparing to see the patient, obtaining and/or reviewing separately obtained history, performing a medically appropriate examination, counseling and educating the patient, ordering medications/tests/procedures, documenting clinical information in the electronic health record and care coordination.   Dede Query, PA-C Department of Hematology/Oncology [place] [facility] Phone: [phone]

## 2021-04-22 LAB — BETA 2 MICROGLOBULIN, SERUM: Beta-2 Microglobulin: 2.9 mg/L — ABNORMAL HIGH (ref 0.6–2.4)

## 2021-04-24 ENCOUNTER — Other Ambulatory Visit: Payer: Self-pay | Admitting: Internal Medicine

## 2021-04-24 DIAGNOSIS — Z79899 Other long term (current) drug therapy: Secondary | ICD-10-CM | POA: Diagnosis not present

## 2021-04-24 DIAGNOSIS — M79671 Pain in right foot: Secondary | ICD-10-CM | POA: Diagnosis not present

## 2021-04-24 DIAGNOSIS — K7460 Unspecified cirrhosis of liver: Secondary | ICD-10-CM | POA: Diagnosis not present

## 2021-04-24 DIAGNOSIS — F1090 Alcohol use, unspecified, uncomplicated: Secondary | ICD-10-CM | POA: Diagnosis not present

## 2021-04-24 DIAGNOSIS — Z87891 Personal history of nicotine dependence: Secondary | ICD-10-CM | POA: Diagnosis not present

## 2021-04-24 DIAGNOSIS — M545 Low back pain, unspecified: Secondary | ICD-10-CM | POA: Diagnosis not present

## 2021-04-24 DIAGNOSIS — D472 Monoclonal gammopathy: Secondary | ICD-10-CM | POA: Diagnosis not present

## 2021-04-24 DIAGNOSIS — M79672 Pain in left foot: Secondary | ICD-10-CM | POA: Diagnosis not present

## 2021-04-24 DIAGNOSIS — K7682 Hepatic encephalopathy: Secondary | ICD-10-CM | POA: Diagnosis not present

## 2021-04-24 DIAGNOSIS — L299 Pruritus, unspecified: Secondary | ICD-10-CM | POA: Diagnosis not present

## 2021-04-24 DIAGNOSIS — D509 Iron deficiency anemia, unspecified: Secondary | ICD-10-CM | POA: Diagnosis not present

## 2021-04-24 DIAGNOSIS — R768 Other specified abnormal immunological findings in serum: Secondary | ICD-10-CM | POA: Diagnosis not present

## 2021-04-24 DIAGNOSIS — R233 Spontaneous ecchymoses: Secondary | ICD-10-CM | POA: Diagnosis not present

## 2021-04-24 LAB — KAPPA/LAMBDA LIGHT CHAINS
Kappa free light chain: 105.5 mg/L — ABNORMAL HIGH (ref 3.3–19.4)
Kappa, lambda light chain ratio: 2.62 — ABNORMAL HIGH (ref 0.26–1.65)
Lambda free light chains: 40.2 mg/L — ABNORMAL HIGH (ref 5.7–26.3)

## 2021-04-25 LAB — MULTIPLE MYELOMA PANEL, SERUM
Albumin SerPl Elph-Mcnc: 3.1 g/dL (ref 2.9–4.4)
Albumin/Glob SerPl: 0.9 (ref 0.7–1.7)
Alpha 1: 0.2 g/dL (ref 0.0–0.4)
Alpha2 Glob SerPl Elph-Mcnc: 0.5 g/dL (ref 0.4–1.0)
B-Globulin SerPl Elph-Mcnc: 1.2 g/dL (ref 0.7–1.3)
Gamma Glob SerPl Elph-Mcnc: 1.7 g/dL (ref 0.4–1.8)
Globulin, Total: 3.7 g/dL (ref 2.2–3.9)
IgA: 942 mg/dL — ABNORMAL HIGH (ref 90–386)
IgG (Immunoglobin G), Serum: 1544 mg/dL (ref 603–1613)
IgM (Immunoglobulin M), Srm: 72 mg/dL (ref 20–172)
M Protein SerPl Elph-Mcnc: 0.9 g/dL — ABNORMAL HIGH
Total Protein ELP: 6.8 g/dL (ref 6.0–8.5)

## 2021-04-26 LAB — UPEP/UIFE/LIGHT CHAINS/TP, 24-HR UR
% BETA, Urine: 0 %
ALPHA 1 URINE: 0 %
Albumin, U: 0 %
Alpha 2, Urine: 0 %
Free Kappa Lt Chains,Ur: 42.25 mg/L (ref 1.17–86.46)
Free Kappa/Lambda Ratio: 9.85 (ref 1.83–14.26)
Free Lambda Lt Chains,Ur: 4.29 mg/L (ref 0.27–15.21)
GAMMA GLOBULIN URINE: 0 %
Total Protein, Urine-Ur/day: 204 mg/24 hr — ABNORMAL HIGH (ref 30–150)
Total Protein, Urine: 11 mg/dL
Total Volume: 1850

## 2021-05-02 ENCOUNTER — Other Ambulatory Visit: Payer: Self-pay

## 2021-05-02 ENCOUNTER — Ambulatory Visit (HOSPITAL_COMMUNITY)
Admission: RE | Admit: 2021-05-02 | Discharge: 2021-05-02 | Disposition: A | Discharge status: Home / Self Care | Payer: BC Managed Care – PPO | Source: Ambulatory Visit | Attending: Physician Assistant | Admitting: Physician Assistant

## 2021-05-02 DIAGNOSIS — D472 Monoclonal gammopathy: Secondary | ICD-10-CM | POA: Diagnosis not present

## 2021-05-03 DIAGNOSIS — K2970 Gastritis, unspecified, without bleeding: Secondary | ICD-10-CM | POA: Insufficient documentation

## 2021-05-03 DIAGNOSIS — K7290 Hepatic failure, unspecified without coma: Secondary | ICD-10-CM | POA: Insufficient documentation

## 2021-05-03 DIAGNOSIS — K900 Celiac disease: Secondary | ICD-10-CM | POA: Insufficient documentation

## 2021-05-03 DIAGNOSIS — F331 Major depressive disorder, recurrent, moderate: Secondary | ICD-10-CM | POA: Insufficient documentation

## 2021-05-03 DIAGNOSIS — F1011 Alcohol abuse, in remission: Secondary | ICD-10-CM | POA: Insufficient documentation

## 2021-05-03 DIAGNOSIS — K7460 Unspecified cirrhosis of liver: Secondary | ICD-10-CM | POA: Insufficient documentation

## 2021-05-03 DIAGNOSIS — F419 Anxiety disorder, unspecified: Secondary | ICD-10-CM | POA: Insufficient documentation

## 2021-05-03 DIAGNOSIS — K921 Melena: Secondary | ICD-10-CM | POA: Insufficient documentation

## 2021-05-03 DIAGNOSIS — K529 Noninfective gastroenteritis and colitis, unspecified: Secondary | ICD-10-CM | POA: Insufficient documentation

## 2021-05-03 DIAGNOSIS — D472 Monoclonal gammopathy: Secondary | ICD-10-CM | POA: Insufficient documentation

## 2021-05-03 DIAGNOSIS — E739 Lactose intolerance, unspecified: Secondary | ICD-10-CM | POA: Insufficient documentation

## 2021-05-03 DIAGNOSIS — R188 Other ascites: Secondary | ICD-10-CM | POA: Insufficient documentation

## 2021-05-03 DIAGNOSIS — K7030 Alcoholic cirrhosis of liver without ascites: Secondary | ICD-10-CM | POA: Insufficient documentation

## 2021-05-03 DIAGNOSIS — J302 Other seasonal allergic rhinitis: Secondary | ICD-10-CM | POA: Insufficient documentation

## 2021-05-04 ENCOUNTER — Inpatient Hospital Stay: Payer: BC Managed Care – PPO

## 2021-05-04 ENCOUNTER — Other Ambulatory Visit: Payer: Self-pay

## 2021-05-04 ENCOUNTER — Inpatient Hospital Stay: Payer: BC Managed Care – PPO | Attending: Physician Assistant | Admitting: Adult Health

## 2021-05-04 VITALS — BP 119/68 | HR 83 | Temp 98.3°F | Resp 18 | Ht 69.0 in | Wt 189.0 lb

## 2021-05-04 DIAGNOSIS — D72821 Monocytosis (symptomatic): Secondary | ICD-10-CM | POA: Diagnosis not present

## 2021-05-04 DIAGNOSIS — D649 Anemia, unspecified: Secondary | ICD-10-CM | POA: Diagnosis not present

## 2021-05-04 DIAGNOSIS — K7682 Hepatic encephalopathy: Secondary | ICD-10-CM | POA: Insufficient documentation

## 2021-05-04 DIAGNOSIS — K7460 Unspecified cirrhosis of liver: Secondary | ICD-10-CM | POA: Diagnosis not present

## 2021-05-04 DIAGNOSIS — D509 Iron deficiency anemia, unspecified: Secondary | ICD-10-CM | POA: Diagnosis not present

## 2021-05-04 DIAGNOSIS — D72822 Plasmacytosis: Secondary | ICD-10-CM | POA: Diagnosis not present

## 2021-05-04 DIAGNOSIS — D472 Monoclonal gammopathy: Secondary | ICD-10-CM | POA: Insufficient documentation

## 2021-05-04 LAB — CMP (CANCER CENTER ONLY)
ALT: 16 U/L (ref 0–44)
AST: 60 U/L — ABNORMAL HIGH (ref 15–41)
Albumin: 3 g/dL — ABNORMAL LOW (ref 3.5–5.0)
Alkaline Phosphatase: 227 U/L — ABNORMAL HIGH (ref 38–126)
Anion gap: 9 (ref 5–15)
BUN: 15 mg/dL (ref 6–20)
CO2: 16 mmol/L — ABNORMAL LOW (ref 22–32)
Calcium: 8.5 mg/dL — ABNORMAL LOW (ref 8.9–10.3)
Chloride: 112 mmol/L — ABNORMAL HIGH (ref 98–111)
Creatinine: 0.97 mg/dL (ref 0.61–1.24)
GFR, Estimated: >60 mL/min (ref >60)
Glucose, Bld: 106 mg/dL — ABNORMAL HIGH (ref 70–99)
Potassium: 4.2 mmol/L (ref 3.5–5.1)
Sodium: 137 mmol/L (ref 135–145)
Total Bilirubin: 1.3 mg/dL — ABNORMAL HIGH (ref 0.3–1.2)
Total Protein: 6.7 g/dL (ref 6.5–8.1)

## 2021-05-04 LAB — CBC WITH DIFFERENTIAL (CANCER CENTER ONLY)
Abs Immature Granulocytes: 0.03 10*3/uL (ref 0.00–0.07)
Basophils Absolute: 0.1 10*3/uL (ref 0.0–0.1)
Basophils Relative: 1 %
Eosinophils Absolute: 0.2 10*3/uL (ref 0.0–0.5)
Eosinophils Relative: 3 %
HCT: 28.9 % — ABNORMAL LOW (ref 39.0–52.0)
Hemoglobin: 9.5 g/dL — ABNORMAL LOW (ref 13.0–17.0)
Immature Granulocytes: 0 %
Lymphocytes Relative: 20 %
Lymphs Abs: 1.4 10*3/uL (ref 0.7–4.0)
MCH: 26.9 pg (ref 26.0–34.0)
MCHC: 32.9 g/dL (ref 30.0–36.0)
MCV: 81.9 fL (ref 80.0–100.0)
Monocytes Absolute: 2 10*3/uL — ABNORMAL HIGH (ref 0.1–1.0)
Monocytes Relative: 29 %
Neutro Abs: 3.3 10*3/uL (ref 1.7–7.7)
Neutrophils Relative %: 47 %
Platelet Count: 171 10*3/uL (ref 150–400)
RBC: 3.53 MIL/uL — ABNORMAL LOW (ref 4.22–5.81)
RDW: 18.9 % — ABNORMAL HIGH (ref 11.5–15.5)
WBC Count: 7 10*3/uL (ref 4.0–10.5)
nRBC: 0 % (ref 0.0–0.2)

## 2021-05-04 NOTE — Patient Instructions (Signed)
Bone Marrow Aspiration and Bone Marrow Biopsy, Adult, Care After This sheet gives you information about how to care for yourself after your procedure. Your health care provider may also give you more specific instructions. If you have problems or questions, contact your health care provider. What can I expect after the procedure? After the procedure, it is common to have: Mild pain and tenderness. Swelling. Bruising. Follow these instructions at home: Puncture site care  Follow instructions from your health care provider about how to take care of the puncture site. Make sure you: Wash your hands with soap and water before and after you change your bandage (dressing). If soap and water are not available, use hand sanitizer. Change your dressing as told by your health care provider. Check your puncture site every day for signs of infection. Check for: More redness, swelling, or pain. Fluid or blood. Warmth. Pus or a bad smell. Activity Return to your normal activities as told by your health care provider. Ask your health care provider what activities are safe for you. Do not lift anything that is heavier than 10 lb (4.5 kg), or the limit that you are told, until your health care provider says that it is safe. Do not drive for 24 hours if you were given a sedative during your procedure. General instructions  Take over-the-counter and prescription medicines only as told by your health care provider. Do not take baths, swim, or use a hot tub until your health care provider approves. Ask your health care provider if you may take showers. You may only be allowed to take sponge baths. If directed, put ice on the affected area. To do this: Put ice in a plastic bag. Place a towel between your skin and the bag. Leave the ice on for 20 minutes, 2-3 times a day. Keep all follow-up visits as told by your health care provider. This is important. Contact a health care provider if: Your pain is not  controlled with medicine. You have a fever. You have more redness, swelling, or pain around the puncture site. You have fluid or blood coming from the puncture site. Your puncture site feels warm to the touch. You have pus or a bad smell coming from the puncture site. Summary After the procedure, it is common to have mild pain, tenderness, swelling, and bruising. Follow instructions from your health care provider about how to take care of the puncture site and what activities are safe for you. Take over-the-counter and prescription medicines only as told by your health care provider. Contact a health care provider if you have any signs of infection, such as fluid or blood coming from the puncture site. This information is not intended to replace advice given to you by your health care provider. Make sure you discuss any questions you have with your health care provider. Document Revised: 11/04/2018 Document Reviewed: 11/04/2018 Elsevier Patient Education  2022 [employer] Inc[employer]  

## 2021-05-04 NOTE — Progress Notes (Signed)
INDICATION: MGUS  Brief examination was performed. ENT: adequate airway clearance Heart: regular rate and rhythm.No Murmurs Lungs: clear to auscultation, no wheezes, normal respiratory effort  Bone Marrow Biopsy and Aspiration Procedure Note   Informed consent was obtained and potential risks including bleeding, infection and pain were reviewed with the patient.  The patient's name, date of birth, identification, consent and allergies were verified prior to the start of procedure and time out was performed.  The left posterior iliac crest was chosen as the site of biopsy.  The skin was prepped with ChloraPrep.   14 cc of 2% lidocaine was used to provide local anaesthesia.   10 cc of bone marrow aspirate was obtained followed by 1cm biopsy.  Pressure was applied to the biopsy site and bandage was placed over the biopsy site. Patient was made to lie on the back for 30 mins prior to discharge.  The procedure was tolerated well. COMPLICATIONS: None BLOOD LOSS: none The patient was discharged home in stable condition with a 1 week follow up to review results.  Patient was provided with post bone marrow biopsy instructions and instructed to call if there was any bleeding or worsening pain.  Specimens sent for flow cytometry, cytogenetics and additional studies.  Signed Scot Dock, NP

## 2021-05-08 LAB — SURGICAL PATHOLOGY

## 2021-05-10 ENCOUNTER — Other Ambulatory Visit: Payer: Self-pay

## 2021-05-10 ENCOUNTER — Inpatient Hospital Stay: Payer: BC Managed Care – PPO

## 2021-05-10 ENCOUNTER — Inpatient Hospital Stay: Payer: BC Managed Care – PPO | Admitting: Hematology and Oncology

## 2021-05-10 VITALS — BP 124/68 | HR 81 | Temp 97.0°F | Resp 17 | Wt 193.1 lb

## 2021-05-10 DIAGNOSIS — K7460 Unspecified cirrhosis of liver: Secondary | ICD-10-CM | POA: Diagnosis not present

## 2021-05-10 DIAGNOSIS — D500 Iron deficiency anemia secondary to blood loss (chronic): Secondary | ICD-10-CM

## 2021-05-10 DIAGNOSIS — D509 Iron deficiency anemia, unspecified: Secondary | ICD-10-CM | POA: Diagnosis not present

## 2021-05-10 DIAGNOSIS — D508 Other iron deficiency anemias: Secondary | ICD-10-CM

## 2021-05-10 DIAGNOSIS — D472 Monoclonal gammopathy: Secondary | ICD-10-CM

## 2021-05-10 DIAGNOSIS — K7682 Hepatic encephalopathy: Secondary | ICD-10-CM | POA: Diagnosis not present

## 2021-05-10 LAB — CBC WITH DIFFERENTIAL (CANCER CENTER ONLY)
Abs Immature Granulocytes: 0.02 10*3/uL (ref 0.00–0.07)
Basophils Absolute: 0.1 10*3/uL (ref 0.0–0.1)
Basophils Relative: 1 %
Eosinophils Absolute: 0.2 10*3/uL (ref 0.0–0.5)
Eosinophils Relative: 4 %
HCT: 31.2 % — ABNORMAL LOW (ref 39.0–52.0)
Hemoglobin: 10.1 g/dL — ABNORMAL LOW (ref 13.0–17.0)
Immature Granulocytes: 0 %
Lymphocytes Relative: 24 %
Lymphs Abs: 1.5 10*3/uL (ref 0.7–4.0)
MCH: 26.7 pg (ref 26.0–34.0)
MCHC: 32.4 g/dL (ref 30.0–36.0)
MCV: 82.5 fL (ref 80.0–100.0)
Monocytes Absolute: 1.7 10*3/uL — ABNORMAL HIGH (ref 0.1–1.0)
Monocytes Relative: 27 %
Neutro Abs: 2.8 10*3/uL (ref 1.7–7.7)
Neutrophils Relative %: 44 %
Platelet Count: 177 10*3/uL (ref 150–400)
RBC: 3.78 MIL/uL — ABNORMAL LOW (ref 4.22–5.81)
RDW: 18.9 % — ABNORMAL HIGH (ref 11.5–15.5)
WBC Count: 6.4 10*3/uL (ref 4.0–10.5)
nRBC: 0 % (ref 0.0–0.2)

## 2021-05-10 LAB — CMP (CANCER CENTER ONLY)
ALT: 18 U/L (ref 0–44)
AST: 62 U/L — ABNORMAL HIGH (ref 15–41)
Albumin: 3.3 g/dL — ABNORMAL LOW (ref 3.5–5.0)
Alkaline Phosphatase: 220 U/L — ABNORMAL HIGH (ref 38–126)
Anion gap: 9 (ref 5–15)
BUN: 15 mg/dL (ref 6–20)
CO2: 18 mmol/L — ABNORMAL LOW (ref 22–32)
Calcium: 8.8 mg/dL — ABNORMAL LOW (ref 8.9–10.3)
Chloride: 109 mmol/L (ref 98–111)
Creatinine: 1.25 mg/dL — ABNORMAL HIGH (ref 0.61–1.24)
GFR, Estimated: >60 mL/min (ref >60)
Glucose, Bld: 77 mg/dL (ref 70–99)
Potassium: 3.9 mmol/L (ref 3.5–5.1)
Sodium: 136 mmol/L (ref 135–145)
Total Bilirubin: 1.6 mg/dL — ABNORMAL HIGH (ref 0.3–1.2)
Total Protein: 7.4 g/dL (ref 6.5–8.1)

## 2021-05-10 LAB — IRON AND TIBC
Iron: 74 ug/dL (ref 42–163)
Saturation Ratios: 15 % — ABNORMAL LOW (ref 20–55)
TIBC: 491 ug/dL — ABNORMAL HIGH (ref 202–409)
UIBC: 417 ug/dL — ABNORMAL HIGH (ref 117–376)

## 2021-05-10 LAB — RETIC PANEL
Immature Retic Fract: 11.8 % (ref 2.3–15.9)
RBC.: 3.82 MIL/uL — ABNORMAL LOW (ref 4.22–5.81)
Retic Count, Absolute: 48.1 10*3/uL (ref 19.0–186.0)
Retic Ct Pct: 1.3 % (ref 0.4–3.1)
Reticulocyte Hemoglobin: 30.1 pg (ref >27.9)

## 2021-05-10 LAB — FERRITIN: Ferritin: 26 ng/mL (ref 24–336)

## 2021-05-10 NOTE — Progress Notes (Signed)
[facility] [phone]   [phone]  PROGRESS NOTE  Patient Care Team: Johna Roles, PA as PCP - General (Internal Medicine)  Hematological/Oncological History # IgG Kappa Monoclonal Gammopathy of Undetermined Significance # Iron Deficiency Anemia  1) 03/31/2021:  -SPEP detected M-protein 1.2 g/dL. Serum free light chains showed kappa light chain 109.8 mg/L, lambda light chains 35.1 mg/L and elevated ratio of 3.13.  -Vitamin B12 level 1,095, Ferritin 27, Iron 72, TIBC 514 (H), Iron saturation 14% (L), folate 12.8.   2) 04/01/2021: WBC 10.6 (H), Hgb 10.5 (L), MCV 82.7, Plt 228, Creatinine 1.15, Calcium 8.5 (L).    3) 04/21/2021: Establish care with [facility] Hematology/Oncology  Interval History:  Benjamin Knight 55 y.o. male with medical history significant for MGUS and iron deficiency anemia who presents for a follow up visit. The patient's last visit was on 04/21/2021 at which time he established care. In the interim since the last visit he underwent a bone marrow biopsy which showed no evidence of multiple myeloma, though concern for iron deficiency anemia.  On exam today [name] is accompanied by his wife.  He reports that bone marrow biopsy was quite painful but there is no residual bruising or bleeding.  He notes he was little sore afterwards but has made a full recovery.  His reporting no overt signs of bleeding.  He underwent an endoscopy and colonoscopy in April 2022 with no clear source of bleeding identified at that time.  He also notes he is not having any difficulty with the stomach and his bowels move quite regularly with the lactulose he has been taking.  He has tolerated p.o. iron therapy well without any difficulty.  He otherwise denies any fevers, chills, sweats, nausea, vomiting or diarrhea.  Full 10 point ROS is listed below.  MEDICAL HISTORY:  Past Medical History:  Diagnosis Date   Bipolar disorder ([place])    Chronic back pain     Cirrhosis ([place])    Depression     SURGICAL HISTORY: Past Surgical History:  Procedure Laterality Date   APPENDECTOMY      SOCIAL HISTORY: Social History   Socioeconomic History   Marital status: Married    Spouse name: Not on file   Number of children: Not on file   Years of education: Not on file   Highest education level: Not on file  Occupational History   Not on file  Tobacco Use   Smoking status: Never   Smokeless tobacco: Current    Types: Chew  Substance and Sexual Activity   Alcohol use: Yes    Comment: Per wife, he has an extensive history of alcohol use with prior attempts of quitting. However, recently noted to be drinking up to 1/5 liquor on weekends and half of 1/5 liquor daily on weekdays until Labor day weekend.   Drug use: Never   Sexual activity: Not on file  Other Topics Concern   Not on file  Social History Narrative   Not on file   Social Determinants of Health   Financial Resource Strain: Not on file  Food Insecurity: Not on file  Transportation Needs: Not on file  Physical Activity: Not on file  Stress: Not on file  Social Connections: Not on file  Intimate Partner Violence: Not on file    FAMILY HISTORY: Family History  Problem Relation Age of Onset   Heart disease Mother     ALLERGIES:  is allergic to sertraline.  MEDICATIONS:  Current  Outpatient Medications  Medication Sig Dispense Refill   ferrous sulfate 325 (65 FE) MG EC tablet Take 1 tablet (325 mg total) by mouth daily with breakfast. 30 tablet 3   folic acid (FOLVITE) 1 MG tablet Take 1 tablet (1 mg total) by mouth daily. 30 tablet 0   furosemide (LASIX) 20 MG tablet Take 20 mg by mouth daily.     lactulose (CHRONULAC) 10 GM/15ML solution Take 45 mLs (30 g total) by mouth 3 (three) times daily. Titrate to a goal of 2-3 bowel movements per day. 3784 mL 0   pantoprazole (PROTONIX) 40 MG tablet Take 1 tablet (40 mg total) by mouth daily. 30 tablet 0   PEPCID 20 MG tablet  Take 10-20 mg by mouth 2 (two) times daily as needed for heartburn or indigestion.     propranolol (INDERAL) 10 MG tablet Take 10 mg by mouth 2 (two) times daily.     spironolactone (ALDACTONE) 50 MG tablet Take 1 tablet (50 mg total) by mouth daily. 30 tablet 0   thiamine 100 MG tablet Take 1 tablet (100 mg total) by mouth daily. 30 tablet 0   No current facility-administered medications for this visit.    REVIEW OF SYSTEMS:   Constitutional: ( - ) fevers, ( - )  chills , ( - ) night sweats Eyes: ( - ) blurriness of vision, ( - ) double vision, ( - ) watery eyes Ears, nose, mouth, throat, and face: ( - ) mucositis, ( - ) sore throat Respiratory: ( - ) cough, ( - ) dyspnea, ( - ) wheezes Cardiovascular: ( - ) palpitation, ( - ) chest discomfort, ( - ) lower extremity swelling Gastrointestinal:  ( - ) nausea, ( - ) heartburn, ( - ) change in bowel habits Skin: ( - ) abnormal skin rashes Lymphatics: ( - ) new lymphadenopathy, ( - ) easy bruising Neurological: ( - ) numbness, ( - ) tingling, ( - ) new weaknesses Behavioral/Psych: ( - ) mood change, ( - ) new changes  All other systems were reviewed with the patient and are negative.  PHYSICAL EXAMINATION: ECOG PERFORMANCE STATUS: 1 - Symptomatic but completely ambulatory  Vitals:   05/10/21 1133  BP: 124/68  Pulse: 81  Resp: 17  Temp: (!) 97 F (36.1 C)  SpO2: 100%   Filed Weights   05/10/21 1133  Weight: 193 lb 1 oz (87.6 kg)    GENERAL: alert, no distress and comfortable SKIN: skin color, texture, turgor are normal, no rashes or significant lesions EYES: conjunctiva are pink and non-injected, sclera clear OROPHARYNX: no exudate, no erythema; lips, buccal mucosa, and tongue normal  NECK: supple, non-tender LYMPH:  no palpable lymphadenopathy in the cervical, axillary or inguinal LUNGS: clear to auscultation and percussion with normal breathing effort HEART: regular rate & rhythm and no murmurs and no lower extremity  edema ABDOMEN: soft, non-tender, non-distended, normal bowel sounds Musculoskeletal: no cyanosis of digits and no clubbing  PSYCH: alert & oriented x 3, fluent speech NEURO: no focal motor/sensory deficits  LABORATORY DATA:  I have reviewed the data as listed CBC Latest Ref Rng & Units 05/10/2021 05/04/2021 04/21/2021  WBC 4.0 - 10.5 K/uL 6.4 7.0 8.9  Hemoglobin 13.0 - 17.0 g/dL 10.1(L) 9.5(L) 10.1(L)  Hematocrit 39.0 - 52.0 % 31.2(L) 28.9(L) 30.9(L)  Platelets 150 - 400 K/uL 177 171 155    CMP Latest Ref Rng & Units 05/10/2021 05/04/2021 04/21/2021  Glucose 70 - 99 mg/dL 77 106(H) 103(H)  BUN 6 - 20 mg/dL 15 15 14   Creatinine 0.61 - 1.24 mg/dL 1.25(H) 0.97 1.10  Sodium 135 - 145 mmol/L 136 137 136  Potassium 3.5 - 5.1 mmol/L 3.9 4.2 3.6  Chloride 98 - 111 mmol/L 109 112(H) 108  CO2 22 - 32 mmol/L 18(L) 16(L) 21(L)  Calcium 8.9 - 10.3 mg/dL 8.8(L) 8.5(L) 8.6(L)  Total Protein 6.5 - 8.1 g/dL 7.4 6.7 7.5  Total Bilirubin 0.3 - 1.2 mg/dL 1.6(H) 1.3(H) 1.1  Alkaline Phos 38 - 126 U/L 220(H) 227(H) 215(H)  AST 15 - 41 U/L 62(H) 60(H) 71(H)  ALT 0 - 44 U/L 18 16 23     Lab Results  Component Value Date   MPROTEIN 0.9 (H) 04/21/2021   Lab Results  Component Value Date   KPAFRELGTCHN 105.5 (H) 04/21/2021   KPAFRELGTCHN 109.8 (H) 03/31/2021   LAMBDASER 40.2 (H) 04/21/2021   LAMBDASER 35.1 (H) 03/31/2021   KAPLAMBRATIO 9.85 04/24/2021   KAPLAMBRATIO 2.62 (H) 04/21/2021   KAPLAMBRATIO 3.13 (H) 03/31/2021    RADIOGRAPHIC STUDIES: DG Bone Survey Met  Result Date: 05/03/2021 CLINICAL DATA:  Monoclonal gammopathy. EXAM: METASTATIC BONE SURVEY COMPARISON:  MRI 03/30/2021. CT 03/30/2021. Chest x-ray 03/30/2021. FINDINGS: Standard imaging of the axial and appendicular skeleton performed. A questionable lucency is noted in the proximal right humerus at the metaphyseal epiphyseal junction. To exclude a focal proximal right humeral lesion MRI of the right shoulder is suggested. No other focal  lytic lesions are identified. No sclerotic lesions are noted. Degenerative changes noted of the thoracolumbar spine. No acute cardiopulmonary disease identified. Aortoiliac atherosclerotic vascular calcification noted. IMPRESSION: A questionable lucency is noted in the proximal right humerus at the metaphyseal epiphyseal junction. To exclude a focal proximal right humeral lesion MRI of the right shoulder suggested. No other lytic lesions identified. Electronically Signed   By: Marcello Moores  Register M.D.   On: 05/03/2021 07:49    ASSESSMENT & PLAN ALCEE SIPOS 55 y.o. male with medical history significant for MGUS and iron deficiency anemia who presents for a follow up visit.   #Monoclonal gammopathy: --Recommend complete workup with CBC, CMP, SPEP/IFE,  kappa/lambda light chain, UPEP,beta 2 microglobulin and LDH levels.  --DG bone met survey showed no clear lytic lesions.  --bone marrow biopsy performed due to elevated serum free light chain ratio of 3.13. Performed on 05/04/2021, no evidence of multiple myeloma.  --RTC in 3 months    #Cirrhosis: --Diagnosed recently in September 2022 after presenting with AMS due to acute hepatic encephalopathy.  --Currently on Lactulose TID. --Patient is scheduled for consultation at [facility] on 06/08/2021.   #Iron deficiency anemia: --Iron panel from 03/31/2021 shows iron deficiency with iron saturation of 14%,  ferritin of 27.  -- Patient trialed on ferrous sulfate 325 mg once daily.  Minimal improvement on oral therapy --We will proceed with IV iron sucrose 200 mg q. 7 days x 5 doses --Discussed with the patient to discuss the need for EGD/colonoscopy to rule out GI bleed, especially esophageal varices in the setting of cirrhosis.  --RTC in 3 months to assess iron stores.     No orders of the defined types were placed in this encounter.   All questions were answered. The patient knows to call the clinic with any problems, questions or  concerns.  A total of more than 30 minutes were spent on this encounter with face-to-face time and non-face-to-face time, including preparing to see the patient, ordering tests and/or medications, counseling the patient and coordination  of care as outlined above.   Ledell Peoples, MD Department of Hematology/Oncology [place] [facility] Phone: [phone] Pager: [phone] Email: [email or web address]  05/10/2021 5:31 PM

## 2021-05-11 ENCOUNTER — Other Ambulatory Visit: Payer: Self-pay | Admitting: Pharmacy Technician

## 2021-05-15 ENCOUNTER — Encounter (HOSPITAL_COMMUNITY): Payer: Self-pay

## 2021-05-19 ENCOUNTER — Other Ambulatory Visit: Payer: Self-pay

## 2021-05-19 ENCOUNTER — Ambulatory Visit: Payer: BC Managed Care – PPO | Admitting: Podiatry

## 2021-05-19 ENCOUNTER — Ambulatory Visit (INDEPENDENT_AMBULATORY_CARE_PROVIDER_SITE_OTHER): Payer: BC Managed Care – PPO

## 2021-05-19 ENCOUNTER — Encounter: Payer: Self-pay | Admitting: Podiatry

## 2021-05-19 VITALS — BP 125/66 | HR 79 | Temp 97.8°F | Resp 18 | Ht 69.0 in | Wt 187.6 lb

## 2021-05-19 DIAGNOSIS — D509 Iron deficiency anemia, unspecified: Secondary | ICD-10-CM

## 2021-05-19 DIAGNOSIS — M7752 Other enthesopathy of left foot: Secondary | ICD-10-CM

## 2021-05-19 DIAGNOSIS — L84 Corns and callosities: Secondary | ICD-10-CM

## 2021-05-19 DIAGNOSIS — M21621 Bunionette of right foot: Secondary | ICD-10-CM | POA: Diagnosis not present

## 2021-05-19 DIAGNOSIS — Z23 Encounter for immunization: Secondary | ICD-10-CM | POA: Diagnosis not present

## 2021-05-19 DIAGNOSIS — M7751 Other enthesopathy of right foot: Secondary | ICD-10-CM

## 2021-05-19 DIAGNOSIS — M21622 Bunionette of left foot: Secondary | ICD-10-CM

## 2021-05-19 DIAGNOSIS — D500 Iron deficiency anemia secondary to blood loss (chronic): Secondary | ICD-10-CM

## 2021-05-19 MED ORDER — ACETAMINOPHEN 325 MG PO TABS
650.0000 mg | ORAL_TABLET | Freq: Once | ORAL | Status: AC
  Administered 2021-05-19: 650 mg via ORAL
  Filled 2021-05-19: qty 2

## 2021-05-19 MED ORDER — SODIUM CHLORIDE 0.9 % IV SOLN
200.0000 mg | Freq: Once | INTRAVENOUS | Status: AC
  Administered 2021-05-19: 200 mg via INTRAVENOUS
  Filled 2021-05-19: qty 10

## 2021-05-19 MED ORDER — SODIUM CHLORIDE 0.9 % IV SOLN: Freq: Once | INTRAVENOUS | Status: DC | PRN

## 2021-05-19 MED ORDER — ALBUTEROL SULFATE HFA 108 (90 BASE) MCG/ACT IN AERS: 2.0000 | INHALATION_SPRAY | Freq: Once | RESPIRATORY_TRACT | Status: DC | PRN

## 2021-05-19 MED ORDER — DIPHENHYDRAMINE HCL 25 MG PO CAPS
50.0000 mg | ORAL_CAPSULE | Freq: Once | ORAL | Status: AC
  Administered 2021-05-19: 50 mg via ORAL
  Filled 2021-05-19: qty 2

## 2021-05-19 MED ORDER — METHYLPREDNISOLONE SODIUM SUCC 125 MG IJ SOLR: 125.0000 mg | Freq: Once | INTRAMUSCULAR | Status: DC | PRN

## 2021-05-19 MED ORDER — FAMOTIDINE IN NACL 20-0.9 MG/50ML-% IV SOLN: 20.0000 mg | Freq: Once | INTRAVENOUS | Status: DC | PRN

## 2021-05-19 MED ORDER — EPINEPHRINE 0.3 MG/0.3ML IJ SOAJ: 0.3000 mg | Freq: Once | INTRAMUSCULAR | Status: DC | PRN

## 2021-05-19 MED ORDER — DIPHENHYDRAMINE HCL 50 MG/ML IJ SOLN: 50.0000 mg | Freq: Once | INTRAMUSCULAR | Status: DC | PRN

## 2021-05-19 MED ORDER — TRIAMCINOLONE ACETONIDE 10 MG/ML IJ SUSP
20.0000 mg | Freq: Once | INTRAMUSCULAR | Status: AC
  Administered 2021-05-19: 20 mg

## 2021-05-19 NOTE — Progress Notes (Signed)
Diagnosis: Iron Deficiency Anemia  Provider:  Marshell Garfinkel, MD  Procedure: Infusion  IV Type: Peripheral, IV Location: L Antecubital  Venofer (Iron Sucrose), Dose: 200 mg  Infusion Start Time: 3235  Infusion Stop Time: 5732  Post Infusion IV Care: Observation period completed and Peripheral IV Discontinued  Discharge: Condition: Good, Destination: Home . AVS provided to patient.   Performed by:  Charlie Pitter, RN

## 2021-05-19 NOTE — Progress Notes (Signed)
Subjective:   Patient ID: Benjamin Knight, male   DOB: 55 y.o.   MRN: [id number]   HPI Patient presents with exquisite discomfort underneath the fifth metatarsal heads of both feet left over right stating he has lesions he tries to work on them with medicated type devices and states that he feels like he is walking on the bone.  Patient states its been at least a year its been going on and probably longer and he works on [employer].  Patient does not smoke likes to be active   Review of Systems  All other systems reviewed and are negative.      Objective:  Physical Exam Vitals and nursing note reviewed.  Constitutional:      Appearance: He is well-developed.  Pulmonary:     Effort: Pulmonary effort is normal.  Musculoskeletal:        General: Normal range of motion.  Skin:    General: Skin is warm.  Neurological:     Mental Status: He is alert.    Neurovascular status intact muscle strength adequate range of motion within normal limits with prominence around the fifth metatarsal head bilateral with inflammation fluid buildup.  There is also a lesion associated with it that becomes painful fluid buildup around the head and prominence of the metatarsal head.  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  I did discuss ultimate fifth metatarsal head resection or possibly we can try orthotics first to try to reduce the stress on the underlying bone structure  X-rays indicate moderate enlargement head of fifth metatarsal head bilateral no other significant pathology

## 2021-05-30 NOTE — Progress Notes (Signed)
Diagnosis: Iron Deficiency Anemia  Provider:  Marshell Garfinkel, MD  Procedure: Infusion  IV Type: Peripheral, IV Location: L Antecubital  Venofer (Iron Sucrose), Dose: 200 mg  Infusion Start Time: 5597  Infusion Stop Time: 4163  Post Infusion IV Care: Observation period completed and Peripheral IV Discontinued  Discharge: Condition: Good, Destination: Home . AVS provided to patient.   Performed by:  Koren Shiver, RN

## 2021-06-02 ENCOUNTER — Other Ambulatory Visit: Payer: Self-pay

## 2021-06-02 ENCOUNTER — Ambulatory Visit (INDEPENDENT_AMBULATORY_CARE_PROVIDER_SITE_OTHER): Payer: BC Managed Care – PPO

## 2021-06-02 VITALS — BP 115/63 | HR 70 | Temp 97.5°F | Resp 18 | Ht 69.0 in | Wt 189.0 lb

## 2021-06-02 DIAGNOSIS — D500 Iron deficiency anemia secondary to blood loss (chronic): Secondary | ICD-10-CM

## 2021-06-02 MED ORDER — SODIUM CHLORIDE 0.9 % IV SOLN
200.0000 mg | Freq: Once | INTRAVENOUS | Status: AC
  Administered 2021-06-02: 200 mg via INTRAVENOUS
  Filled 2021-06-02: qty 10

## 2021-06-02 MED ORDER — ACETAMINOPHEN 325 MG PO TABS
650.0000 mg | ORAL_TABLET | Freq: Once | ORAL | Status: AC
  Administered 2021-06-02: 650 mg via ORAL
  Filled 2021-06-02: qty 2

## 2021-06-02 MED ORDER — DIPHENHYDRAMINE HCL 50 MG/ML IJ SOLN: 50.0000 mg | Freq: Once | INTRAMUSCULAR | Status: DC | PRN

## 2021-06-02 MED ORDER — DIPHENHYDRAMINE HCL 25 MG PO CAPS
50.0000 mg | ORAL_CAPSULE | Freq: Once | ORAL | Status: AC
  Administered 2021-06-02: 50 mg via ORAL
  Filled 2021-06-02: qty 2

## 2021-06-02 MED ORDER — EPINEPHRINE 0.3 MG/0.3ML IJ SOAJ: 0.3000 mg | Freq: Once | INTRAMUSCULAR | Status: DC | PRN

## 2021-06-02 MED ORDER — ALBUTEROL SULFATE HFA 108 (90 BASE) MCG/ACT IN AERS: 2.0000 | INHALATION_SPRAY | Freq: Once | RESPIRATORY_TRACT | Status: DC | PRN

## 2021-06-02 MED ORDER — SODIUM CHLORIDE 0.9 % IV SOLN: Freq: Once | INTRAVENOUS | Status: DC | PRN

## 2021-06-02 MED ORDER — METHYLPREDNISOLONE SODIUM SUCC 125 MG IJ SOLR: 125.0000 mg | Freq: Once | INTRAMUSCULAR | Status: DC | PRN

## 2021-06-02 MED ORDER — FAMOTIDINE IN NACL 20-0.9 MG/50ML-% IV SOLN: 20.0000 mg | Freq: Once | INTRAVENOUS | Status: DC | PRN

## 2021-06-02 NOTE — Progress Notes (Signed)
Diagnosis: Iron Deficiency Anemia  Provider:  Marshell Garfinkel, MD  Procedure: Infusion  IV Type: Peripheral, IV Location: L Antecubital  Venofer (Iron Sucrose), Dose: 200 mg  Infusion Start Time: 4827  Infusion Stop Time: 0786  Post Infusion IV Care: Peripheral IV Discontinued  Discharge: Condition: Good, Destination: Home . AVS provided to patient.   Performed by:  Charlie Pitter, RN

## 2021-06-08 DIAGNOSIS — F319 Bipolar disorder, unspecified: Secondary | ICD-10-CM | POA: Diagnosis not present

## 2021-06-08 DIAGNOSIS — K766 Portal hypertension: Secondary | ICD-10-CM | POA: Diagnosis not present

## 2021-06-08 DIAGNOSIS — Z87891 Personal history of nicotine dependence: Secondary | ICD-10-CM | POA: Diagnosis not present

## 2021-06-08 DIAGNOSIS — F419 Anxiety disorder, unspecified: Secondary | ICD-10-CM | POA: Diagnosis not present

## 2021-06-08 DIAGNOSIS — Z79899 Other long term (current) drug therapy: Secondary | ICD-10-CM | POA: Diagnosis not present

## 2021-06-08 DIAGNOSIS — K7682 Hepatic encephalopathy: Secondary | ICD-10-CM | POA: Diagnosis not present

## 2021-06-08 DIAGNOSIS — K7460 Unspecified cirrhosis of liver: Secondary | ICD-10-CM | POA: Diagnosis not present

## 2021-06-08 DIAGNOSIS — K900 Celiac disease: Secondary | ICD-10-CM | POA: Diagnosis not present

## 2021-06-08 DIAGNOSIS — K760 Fatty (change of) liver, not elsewhere classified: Secondary | ICD-10-CM | POA: Diagnosis not present

## 2021-06-08 DIAGNOSIS — E611 Iron deficiency: Secondary | ICD-10-CM | POA: Diagnosis not present

## 2021-06-08 DIAGNOSIS — D509 Iron deficiency anemia, unspecified: Secondary | ICD-10-CM | POA: Diagnosis not present

## 2021-06-09 ENCOUNTER — Ambulatory Visit: Payer: BC Managed Care – PPO | Admitting: Podiatry

## 2021-06-09 ENCOUNTER — Ambulatory Visit (INDEPENDENT_AMBULATORY_CARE_PROVIDER_SITE_OTHER): Payer: BC Managed Care – PPO

## 2021-06-09 ENCOUNTER — Other Ambulatory Visit: Payer: Self-pay

## 2021-06-09 ENCOUNTER — Ambulatory Visit: Payer: BC Managed Care – PPO

## 2021-06-09 ENCOUNTER — Encounter: Payer: Self-pay | Admitting: Podiatry

## 2021-06-09 VITALS — BP 119/66 | HR 70 | Temp 98.0°F | Resp 18 | Ht 68.5 in | Wt 187.0 lb

## 2021-06-09 DIAGNOSIS — D500 Iron deficiency anemia secondary to blood loss (chronic): Secondary | ICD-10-CM

## 2021-06-09 DIAGNOSIS — M7751 Other enthesopathy of right foot: Secondary | ICD-10-CM | POA: Diagnosis not present

## 2021-06-09 DIAGNOSIS — M7752 Other enthesopathy of left foot: Secondary | ICD-10-CM

## 2021-06-09 MED ORDER — ACETAMINOPHEN 325 MG PO TABS
650.0000 mg | ORAL_TABLET | Freq: Once | ORAL | Status: AC
  Administered 2021-06-09: 650 mg via ORAL
  Filled 2021-06-09: qty 2

## 2021-06-09 MED ORDER — SODIUM CHLORIDE 0.9 % IV SOLN
200.0000 mg | Freq: Once | INTRAVENOUS | Status: AC
  Administered 2021-06-09: 200 mg via INTRAVENOUS
  Filled 2021-06-09: qty 10

## 2021-06-09 MED ORDER — EPINEPHRINE 0.3 MG/0.3ML IJ SOAJ: 0.3000 mg | Freq: Once | INTRAMUSCULAR | Status: DC | PRN

## 2021-06-09 MED ORDER — DIPHENHYDRAMINE HCL 25 MG PO CAPS
50.0000 mg | ORAL_CAPSULE | Freq: Once | ORAL | Status: AC
  Administered 2021-06-09: 50 mg via ORAL
  Filled 2021-06-09: qty 2

## 2021-06-09 MED ORDER — SODIUM CHLORIDE 0.9 % IV SOLN: Freq: Once | INTRAVENOUS | Status: DC | PRN

## 2021-06-09 MED ORDER — ALBUTEROL SULFATE HFA 108 (90 BASE) MCG/ACT IN AERS: 2.0000 | INHALATION_SPRAY | Freq: Once | RESPIRATORY_TRACT | Status: DC | PRN

## 2021-06-09 MED ORDER — FAMOTIDINE IN NACL 20-0.9 MG/50ML-% IV SOLN: 20.0000 mg | Freq: Once | INTRAVENOUS | Status: DC | PRN

## 2021-06-09 MED ORDER — METHYLPREDNISOLONE SODIUM SUCC 125 MG IJ SOLR: 125.0000 mg | Freq: Once | INTRAMUSCULAR | Status: DC | PRN

## 2021-06-09 MED ORDER — DIPHENHYDRAMINE HCL 50 MG/ML IJ SOLN: 50.0000 mg | Freq: Once | INTRAMUSCULAR | Status: DC | PRN

## 2021-06-09 NOTE — Progress Notes (Signed)
SITUATION Reason for Consult: Evaluation for Bilateral Custom Foot Orthoses Patient / Caregiver Report: Patient is ready for foot orthotics  OBJECTIVE DATA: Patient History / Diagnosis: Capsulitis of metatarsophalangeal (MTP) joint of right foot  Capsulitis of metatarsophalangeal (MTP) joint of left foot  Current or Previous Devices: None and no history  Foot Examination: Skin presentation:   Intact Ulcers & Callousing:   None and no history Toe / Foot Deformities:  Pes planus Weight Bearing Presentation:  planus Sensation:    Intact  ORTHOTIC RECOMMENDATION Recommended Device: 1x pair of custom functional foot orthotics  GOALS OF ORTHOSES - Reduce Pain - Prevent Foot Deformity - Prevent Progression of Further Foot Deformity - Relieve Pressure - Improve the Overall Biomechanical Function of the Foot and Lower Extremity.  ACTIONS PERFORMED Patient was casted for Foot Orthoses via crush box. Procedure was explained and patient tolerated procedure well. All questions were answered and concerns addressed.  PLAN Potential out of pocket cost was communicated to patient. Casts are to be sent to [facility] for fabrication. Patient is to be called for fitting when devices are ready.

## 2021-06-09 NOTE — Progress Notes (Signed)
Subjective:   Patient ID: Benjamin Knight, male   DOB: 55 y.o.   MRN: [id number]   HPI Patient presents stating the pads really help him but he still gets a lot of pain in the outside of both feet   ROS      Objective:  Physical Exam  Neurovascular status intact with chronic bony deformity of the fifth metatarsal head of both feet that are very painful when pressed and make walking difficult     Assessment:  Chronic fifth metatarsal head pressure bilateral     Plan:  H&P reviewed condition and recommended long-term orthotics to offload weight and patient is casted for functional orthotics by ped orthotist and will be seen back when ready

## 2021-06-09 NOTE — Progress Notes (Signed)
Diagnosis: Iron Deficiency Anemia  Provider:  Marshell Garfinkel, MD  Procedure: Infusion  IV Type: Peripheral, IV Location: R Antecubital  Venofer (Iron Sucrose), Dose: 200 mg  Infusion Start Time: 2393  Infusion Stop Time: 0915  Post Infusion IV Care: Peripheral IV Discontinued  Discharge: Condition: Good, Destination: Home . AVS provided to patient.   Performed by:  Charlie Pitter, RN

## 2021-06-16 ENCOUNTER — Other Ambulatory Visit: Payer: Self-pay

## 2021-06-16 ENCOUNTER — Ambulatory Visit (INDEPENDENT_AMBULATORY_CARE_PROVIDER_SITE_OTHER): Payer: BC Managed Care – PPO | Admitting: *Deleted

## 2021-06-16 VITALS — BP 110/65 | HR 70 | Temp 97.7°F | Resp 18 | Ht 68.0 in | Wt 191.4 lb

## 2021-06-16 DIAGNOSIS — D500 Iron deficiency anemia secondary to blood loss (chronic): Secondary | ICD-10-CM

## 2021-06-16 MED ORDER — ALBUTEROL SULFATE HFA 108 (90 BASE) MCG/ACT IN AERS: 2.0000 | INHALATION_SPRAY | Freq: Once | RESPIRATORY_TRACT | Status: DC | PRN

## 2021-06-16 MED ORDER — FAMOTIDINE IN NACL 20-0.9 MG/50ML-% IV SOLN: 20.0000 mg | Freq: Once | INTRAVENOUS | Status: DC | PRN

## 2021-06-16 MED ORDER — DIPHENHYDRAMINE HCL 50 MG/ML IJ SOLN: 50.0000 mg | Freq: Once | INTRAMUSCULAR | Status: DC | PRN

## 2021-06-16 MED ORDER — ACETAMINOPHEN 325 MG PO TABS
650.0000 mg | ORAL_TABLET | Freq: Once | ORAL | Status: AC
  Administered 2021-06-16: 650 mg via ORAL

## 2021-06-16 MED ORDER — SODIUM CHLORIDE 0.9 % IV SOLN: Freq: Once | INTRAVENOUS | Status: DC | PRN

## 2021-06-16 MED ORDER — DIPHENHYDRAMINE HCL 25 MG PO CAPS
50.0000 mg | ORAL_CAPSULE | Freq: Once | ORAL | Status: AC
  Administered 2021-06-16: 50 mg via ORAL

## 2021-06-16 MED ORDER — SODIUM CHLORIDE 0.9 % IV SOLN
200.0000 mg | Freq: Once | INTRAVENOUS | Status: AC
  Administered 2021-06-16: 200 mg via INTRAVENOUS
  Filled 2021-06-16: qty 10

## 2021-06-16 MED ORDER — METHYLPREDNISOLONE SODIUM SUCC 125 MG IJ SOLR: 125.0000 mg | Freq: Once | INTRAMUSCULAR | Status: DC | PRN

## 2021-06-16 MED ORDER — EPINEPHRINE 0.3 MG/0.3ML IJ SOAJ: 0.3000 mg | Freq: Once | INTRAMUSCULAR | Status: DC | PRN

## 2021-06-16 NOTE — Progress Notes (Signed)
Diagnosis: Iron Deficiency Anemia  Provider:  Marshell Garfinkel, MD  Procedure: Infusion  IV Type: Peripheral, IV Location: R Antecubital  Venofer (Iron Sucrose), Dose: 200 mg  Infusion Start Time: 0920 am  Infusion Stop Time: 0937 am  Post Infusion IV Care: Observation period completed and Peripheral IV Discontinued  Discharge: Condition: Good, Destination: Home . AVS provided to patient.   Performed by:  Oren Beckmann, RN

## 2021-06-22 DIAGNOSIS — I864 Gastric varices: Secondary | ICD-10-CM | POA: Diagnosis not present

## 2021-06-22 DIAGNOSIS — K766 Portal hypertension: Secondary | ICD-10-CM | POA: Diagnosis not present

## 2021-06-22 DIAGNOSIS — K7689 Other specified diseases of liver: Secondary | ICD-10-CM | POA: Diagnosis not present

## 2021-06-22 DIAGNOSIS — K7460 Unspecified cirrhosis of liver: Secondary | ICD-10-CM | POA: Diagnosis not present

## 2021-06-27 ENCOUNTER — Ambulatory Visit (INDEPENDENT_AMBULATORY_CARE_PROVIDER_SITE_OTHER): Payer: BC Managed Care – PPO

## 2021-06-27 ENCOUNTER — Other Ambulatory Visit: Payer: Self-pay

## 2021-06-27 VITALS — BP 139/80 | HR 83 | Temp 97.8°F | Resp 18 | Ht 68.0 in | Wt 191.4 lb

## 2021-06-27 DIAGNOSIS — D500 Iron deficiency anemia secondary to blood loss (chronic): Secondary | ICD-10-CM | POA: Diagnosis not present

## 2021-06-27 MED ORDER — METHYLPREDNISOLONE SODIUM SUCC 125 MG IJ SOLR: 125.0000 mg | Freq: Once | INTRAMUSCULAR | Status: DC | PRN

## 2021-06-27 MED ORDER — ALBUTEROL SULFATE HFA 108 (90 BASE) MCG/ACT IN AERS: 2.0000 | INHALATION_SPRAY | Freq: Once | RESPIRATORY_TRACT | Status: DC | PRN

## 2021-06-27 MED ORDER — SODIUM CHLORIDE 0.9 % IV SOLN
200.0000 mg | Freq: Once | INTRAVENOUS | Status: AC
  Administered 2021-06-27: 09:00:00 200 mg via INTRAVENOUS
  Filled 2021-06-27: qty 10

## 2021-06-27 MED ORDER — SODIUM CHLORIDE 0.9 % IV SOLN: Freq: Once | INTRAVENOUS | Status: DC | PRN

## 2021-06-27 MED ORDER — FAMOTIDINE IN NACL 20-0.9 MG/50ML-% IV SOLN: 20.0000 mg | Freq: Once | INTRAVENOUS | Status: DC | PRN

## 2021-06-27 MED ORDER — DIPHENHYDRAMINE HCL 50 MG/ML IJ SOLN: 50.0000 mg | Freq: Once | INTRAMUSCULAR | Status: DC | PRN

## 2021-06-27 MED ORDER — EPINEPHRINE 0.3 MG/0.3ML IJ SOAJ: 0.3000 mg | Freq: Once | INTRAMUSCULAR | Status: DC | PRN

## 2021-06-27 MED ORDER — DIPHENHYDRAMINE HCL 25 MG PO CAPS
50.0000 mg | ORAL_CAPSULE | Freq: Once | ORAL | Status: AC
  Administered 2021-06-27: 08:00:00 50 mg via ORAL
  Filled 2021-06-27: qty 2

## 2021-06-27 MED ORDER — ACETAMINOPHEN 325 MG PO TABS
650.0000 mg | ORAL_TABLET | Freq: Once | ORAL | Status: AC
  Administered 2021-06-27: 08:00:00 650 mg via ORAL
  Filled 2021-06-27: qty 2

## 2021-06-27 NOTE — Progress Notes (Signed)
Diagnosis: Iron Deficiency Anemia  Provider:  Marshell Garfinkel, MD  Procedure: Infusion  IV Type: Peripheral, IV Location: L Antecubital  Venofer (Iron Sucrose), Dose: 200 mg  Infusion Start Time: 09.04 06/27/2021  Infusion Stop Time: 09.21 06/27/2021  Post Infusion IV Care: Peripheral IV Discontinued  Discharge: Condition: Good, Destination: Home . AVS provided to patient.   Performed by:  Arnoldo Morale, RN

## 2021-07-10 ENCOUNTER — Telehealth: Payer: Self-pay | Admitting: Podiatry

## 2021-07-10 NOTE — Telephone Encounter (Signed)
Orthotics in.. lvm for pt to call to schedule an appt to pick them up. °

## 2021-07-14 DIAGNOSIS — Z5181 Encounter for therapeutic drug level monitoring: Secondary | ICD-10-CM | POA: Diagnosis not present

## 2021-07-14 DIAGNOSIS — K7460 Unspecified cirrhosis of liver: Secondary | ICD-10-CM | POA: Diagnosis not present

## 2021-07-14 DIAGNOSIS — Z0000 Encounter for general adult medical examination without abnormal findings: Secondary | ICD-10-CM | POA: Diagnosis not present

## 2021-07-14 DIAGNOSIS — K900 Celiac disease: Secondary | ICD-10-CM | POA: Diagnosis not present

## 2021-07-14 DIAGNOSIS — Z125 Encounter for screening for malignant neoplasm of prostate: Secondary | ICD-10-CM | POA: Diagnosis not present

## 2021-07-17 DIAGNOSIS — H25813 Combined forms of age-related cataract, bilateral: Secondary | ICD-10-CM | POA: Diagnosis not present

## 2021-07-17 DIAGNOSIS — H524 Presbyopia: Secondary | ICD-10-CM | POA: Diagnosis not present

## 2021-07-17 DIAGNOSIS — K769 Liver disease, unspecified: Secondary | ICD-10-CM | POA: Diagnosis not present

## 2021-07-17 DIAGNOSIS — H40013 Open angle with borderline findings, low risk, bilateral: Secondary | ICD-10-CM | POA: Diagnosis not present

## 2021-07-27 ENCOUNTER — Telehealth: Payer: Self-pay | Admitting: Podiatry

## 2021-07-27 ENCOUNTER — Encounter: Payer: Self-pay | Admitting: Podiatry

## 2021-07-27 NOTE — Telephone Encounter (Signed)
Please call patient for update.

## 2021-07-27 NOTE — Telephone Encounter (Signed)
Pt sent message asking about status of orthotics was told it would be 4 to 5 wks.  I called pt and left message again(1st time was 1.9.2023 @ 1054am) that the orthotics are in and to please call me to schedule an appt to pick them up.

## 2021-08-04 ENCOUNTER — Ambulatory Visit: Payer: BC Managed Care – PPO

## 2021-08-04 ENCOUNTER — Other Ambulatory Visit: Payer: Self-pay

## 2021-08-04 DIAGNOSIS — M7751 Other enthesopathy of right foot: Secondary | ICD-10-CM

## 2021-08-04 DIAGNOSIS — M7752 Other enthesopathy of left foot: Secondary | ICD-10-CM

## 2021-08-04 DIAGNOSIS — M21621 Bunionette of right foot: Secondary | ICD-10-CM

## 2021-08-04 DIAGNOSIS — L84 Corns and callosities: Secondary | ICD-10-CM

## 2021-08-04 DIAGNOSIS — M21622 Bunionette of left foot: Secondary | ICD-10-CM

## 2021-08-04 NOTE — Progress Notes (Signed)
SITUATION: Reason for Visit: Fitting and Delivery of Custom Fabricated Foot Orthoses Patient Report: Patient reports comfort and is satisfied with device.  OBJECTIVE DATA: Patient History / Diagnosis:     ICD-10-CM   1. Capsulitis of metatarsophalangeal (MTP) joint of right foot  M77.51     2. Capsulitis of metatarsophalangeal (MTP) joint of left foot  M77.52     3. Tailor's bunion of both feet  M21.621    M21.622     4. Corns and callus  L84       Provided Device:  Custom Functional Foot Orthotics     [facility] Labs: [id number]  GOAL OF ORTHOSIS - Improve gait - Decrease energy expenditure - Improve Balance - Provide Triplanar stability of foot complex - Facilitate motion  ACTIONS PERFORMED Patient was fit with foot orthotics trimmed to shoe last. Patient tolerated fittign procedure.   Patient was provided with verbal and written instruction and demonstration regarding donning, doffing, wear, care, proper fit, function, purpose, cleaning, and use of the orthosis and in all related precautions and risks and benefits regarding the orthosis.  Patient was also provided with verbal instruction regarding how to report any failures or malfunctions of the orthosis and necessary follow up care. Patient was also instructed to contact our office regarding any change in status that may affect the function of the orthosis.  Patient demonstrated independence with proper donning, doffing, and fit and verbalized understanding of all instructions.  PLAN: Patient is to follow up in one week or as necessary (PRN). All questions were answered and concerns addressed. Plan of care was discussed with and agreed upon by the patient.

## 2021-08-09 ENCOUNTER — Other Ambulatory Visit: Payer: Self-pay | Admitting: Hematology and Oncology

## 2021-08-09 ENCOUNTER — Other Ambulatory Visit: Payer: Self-pay

## 2021-08-09 ENCOUNTER — Inpatient Hospital Stay: Payer: BC Managed Care – PPO | Attending: Physician Assistant

## 2021-08-09 ENCOUNTER — Inpatient Hospital Stay: Payer: BC Managed Care – PPO | Admitting: Hematology and Oncology

## 2021-08-09 VITALS — BP 126/71 | HR 73 | Temp 98.2°F | Resp 16 | Wt 189.8 lb

## 2021-08-09 DIAGNOSIS — D472 Monoclonal gammopathy: Secondary | ICD-10-CM

## 2021-08-09 DIAGNOSIS — K7460 Unspecified cirrhosis of liver: Secondary | ICD-10-CM | POA: Diagnosis not present

## 2021-08-09 DIAGNOSIS — D509 Iron deficiency anemia, unspecified: Secondary | ICD-10-CM | POA: Insufficient documentation

## 2021-08-09 DIAGNOSIS — D500 Iron deficiency anemia secondary to blood loss (chronic): Secondary | ICD-10-CM

## 2021-08-09 DIAGNOSIS — K7682 Hepatic encephalopathy: Secondary | ICD-10-CM | POA: Diagnosis not present

## 2021-08-09 LAB — CBC WITH DIFFERENTIAL (CANCER CENTER ONLY)
Abs Immature Granulocytes: 0.05 10*3/uL (ref 0.00–0.07)
Basophils Absolute: 0.1 10*3/uL (ref 0.0–0.1)
Basophils Relative: 1 %
Eosinophils Absolute: 0.2 10*3/uL (ref 0.0–0.5)
Eosinophils Relative: 3 %
HCT: 37.8 % — ABNORMAL LOW (ref 39.0–52.0)
Hemoglobin: 12.4 g/dL — ABNORMAL LOW (ref 13.0–17.0)
Immature Granulocytes: 1 %
Lymphocytes Relative: 19 %
Lymphs Abs: 1.5 10*3/uL (ref 0.7–4.0)
MCH: 29.6 pg (ref 26.0–34.0)
MCHC: 32.8 g/dL (ref 30.0–36.0)
MCV: 90.2 fL (ref 80.0–100.0)
Monocytes Absolute: 1.5 10*3/uL — ABNORMAL HIGH (ref 0.1–1.0)
Monocytes Relative: 19 %
Neutro Abs: 4.7 10*3/uL (ref 1.7–7.7)
Neutrophils Relative %: 57 %
Platelet Count: 157 10*3/uL (ref 150–400)
RBC: 4.19 MIL/uL — ABNORMAL LOW (ref 4.22–5.81)
RDW: 15.9 % — ABNORMAL HIGH (ref 11.5–15.5)
WBC Count: 8 10*3/uL (ref 4.0–10.5)
nRBC: 0 % (ref 0.0–0.2)

## 2021-08-09 LAB — IRON AND IRON BINDING CAPACITY (CC-WL,HP ONLY)
Iron: 190 ug/dL — ABNORMAL HIGH (ref 45–182)
Saturation Ratios: 49 % — ABNORMAL HIGH (ref 17.9–39.5)
TIBC: 391 ug/dL (ref 250–450)
UIBC: 201 ug/dL (ref 117–376)

## 2021-08-09 LAB — CMP (CANCER CENTER ONLY)
ALT: 22 U/L (ref 0–44)
AST: 64 U/L — ABNORMAL HIGH (ref 15–41)
Albumin: 3.7 g/dL (ref 3.5–5.0)
Alkaline Phosphatase: 225 U/L — ABNORMAL HIGH (ref 38–126)
Anion gap: 8 (ref 5–15)
BUN: 10 mg/dL (ref 6–20)
CO2: 22 mmol/L (ref 22–32)
Calcium: 9 mg/dL (ref 8.9–10.3)
Chloride: 104 mmol/L (ref 98–111)
Creatinine: 1.09 mg/dL (ref 0.61–1.24)
GFR, Estimated: >60 mL/min (ref >60)
Glucose, Bld: 89 mg/dL (ref 70–99)
Potassium: 3.5 mmol/L (ref 3.5–5.1)
Sodium: 134 mmol/L — ABNORMAL LOW (ref 135–145)
Total Bilirubin: 1.8 mg/dL — ABNORMAL HIGH (ref 0.3–1.2)
Total Protein: 7.4 g/dL (ref 6.5–8.1)

## 2021-08-09 LAB — RETIC PANEL
Immature Retic Fract: 6.2 % (ref 2.3–15.9)
RBC.: 4.16 MIL/uL — ABNORMAL LOW (ref 4.22–5.81)
Retic Count, Absolute: 48.3 10*3/uL (ref 19.0–186.0)
Retic Ct Pct: 1.2 % (ref 0.4–3.1)
Reticulocyte Hemoglobin: 33.9 pg (ref >27.9)

## 2021-08-09 LAB — FERRITIN: Ferritin: 166 ng/mL (ref 24–336)

## 2021-08-09 MED ORDER — FERROUS SULFATE 325 (65 FE) MG PO TABS: 325.0000 mg | ORAL_TABLET | Freq: Every day | ORAL | 3 refills | Status: DC

## 2021-08-09 NOTE — Progress Notes (Signed)
[place] [phone]   [phone]  PROGRESS NOTE  Patient Care Team: Johna Roles, PA as PCP - General (Internal Medicine)  Hematological/Oncological History # IgG Kappa Monoclonal Gammopathy of Undetermined Significance # Iron Deficiency Anemia  1) 03/31/2021:  -SPEP detected M-protein 1.2 g/dL. Serum free light chains showed kappa light chain 109.8 mg/L, lambda light chains 35.1 mg/L and elevated ratio of 3.13.  -Vitamin B12 level 1,095, Ferritin 27, Iron 72, TIBC 514 (H), Iron saturation 14% (L), folate 12.8.   2) 04/01/2021: WBC 10.6 (H), Hgb 10.5 (L), MCV 82.7, Plt 228, Creatinine 1.15, Calcium 8.5 (L).    3) 04/21/2021: Establish care with [facility]emat[facility]  Interval History:  Benjamin Knight 56 y.o. male with medical history significant for MGUS and iron deficiency anemia who presents for a follow up visit. The patient's last visit was on 05/10/2021.  In the interim since his last visit he has received 5 doses of IV iron sucrose 276m, last dose on 06/27/2021.   On exam today Benjamin Knight he has been well in the interim since her last visit.  He notes that he tolerated the iron infusions well with no side effects.  He did notice a good boost in energy though he notes he still feels "a little chilly".  He notes that he has been taking over-the-counter iron pills without difficulty not having any stomach upset.  He notes that he does occasionally get some nosebleeds but has not had any other overt sources of bleeding.  He continues to take lactulose 3 times daily and so his stools do run loose.  He has not been seeing any blood in the stool.  [facility]  He notes he feels better now than he has in several years.  He otherwise denies any fevers, chills, sweats, nausea, vomiting or diarrhea.  Full 10 point ROS is listed below.  MEDICAL  HISTORY:  Past Medical History:  Diagnosis Date   Bipolar disorder ([place]    Chronic back pain    Cirrhosis ([place]    Depression     SURGICAL HISTORY: Past Surgical History:  Procedure Laterality Date   APPENDECTOMY      SOCIAL HISTORY: Social History   Socioeconomic History   Marital status: Married    Spouse name: Not on file   Number of children: Not on file   Years of education: Not on file   Highest education level: Not on file  Occupational History   Not on file  Tobacco Use   Smoking status: Never   Smokeless tobacco: Current    Types: Chew  Substance and Sexual Activity   Alcohol use: Yes    Comment: Per wife, he has an extensive history of alcohol use with prior attempts of quitting. However, recently noted to be drinking up to 1/5 liquor on weekends and half of 1/5 liquor daily on weekdays until Labor day weekend.   Drug use: Never   Sexual activity: Not on file  Other Topics Concern   Not on file  Social History Narrative   Not on file   Social Determinants of Health   Financial Resource Strain: Not on file  Food Insecurity: Not on file  Transportation Needs: Not on file  Physical Activity: Not on file  Stress: Not on file  Social Connections: Not on file  Intimate Partner Violence: Not on file    FAMILY  HISTORY: Family History  Problem Relation Age of Onset   Heart disease Mother     ALLERGIES:  is allergic to sertraline.  MEDICATIONS:  Current Outpatient Medications  Medication Sig Dispense Refill   ferrous sulfate 325 (65 FE) MG tablet Take 1 tablet (325 mg total) by mouth daily with breakfast. Please take with a source of Vitamin C 90 tablet 3   folic acid (FOLVITE) 1 MG tablet Take 1 tablet (1 mg total) by mouth daily. 30 tablet 0   furosemide (LASIX) 20 MG tablet Take 20 mg by mouth daily.     lactulose (CHRONULAC) 10 GM/15ML solution Take 45 mLs (30 g total) by mouth 3 (three) times daily. Titrate to a goal of 2-3 bowel movements per  day. 3784 mL 0   pantoprazole (PROTONIX) 40 MG tablet Take 1 tablet (40 mg total) by mouth daily. 30 tablet 0   PEPCID 20 MG tablet Take 10-20 mg by mouth 2 (two) times daily as needed for heartburn or indigestion.     propranolol (INDERAL) 10 MG tablet Take 10 mg by mouth 2 (two) times daily.     spironolactone (ALDACTONE) 50 MG tablet Take 1 tablet (50 mg total) by mouth daily. 30 tablet 0   thiamine 100 MG tablet Take 1 tablet (100 mg total) by mouth daily. 30 tablet 0   No current facility-administered medications for this visit.    REVIEW OF SYSTEMS:   Constitutional: ( - ) fevers, ( - )  chills , ( - ) night sweats Eyes: ( - ) blurriness of vision, ( - ) double vision, ( - ) watery eyes Ears, nose, mouth, throat, and face: ( - ) mucositis, ( - ) sore throat Respiratory: ( - ) cough, ( - ) dyspnea, ( - ) wheezes Cardiovascular: ( - ) palpitation, ( - ) chest discomfort, ( - ) lower extremity swelling Gastrointestinal:  ( - ) nausea, ( - ) heartburn, ( - ) change in bowel habits Skin: ( - ) abnormal skin rashes Lymphatics: ( - ) new lymphadenopathy, ( - ) easy bruising Neurological: ( - ) numbness, ( - ) tingling, ( - ) new weaknesses Behavioral/Psych: ( - ) mood change, ( - ) new changes  All other systems were reviewed with the patient and are negative.  PHYSICAL EXAMINATION: ECOG PERFORMANCE STATUS: 1 - Symptomatic but completely ambulatory  Vitals:   08/09/21 1059  BP: 126/71  Pulse: 73  Resp: 16  Temp: 98.2 F (36.8 C)  SpO2: 100%   Filed Weights   08/09/21 1059  Weight: 189 lb 12.8 oz (86.1 kg)    GENERAL: alert, no distress and comfortable SKIN: skin color, texture, turgor are normal, no rashes or significant lesions EYES: conjunctiva are pink and non-injected, sclera clear OROPHARYNX: no exudate, no erythema; lips, buccal mucosa, and tongue normal  NECK: supple, non-tender LYMPH:  no palpable lymphadenopathy in the cervical, axillary or inguinal LUNGS: clear  to auscultation and percussion with normal breathing effort HEART: regular rate & rhythm and no murmurs and no lower extremity edema ABDOMEN: soft, non-tender, non-distended, normal bowel sounds Musculoskeletal: no cyanosis of digits and no clubbing  PSYCH: alert & oriented x 3, fluent speech NEURO: no focal motor/sensory deficits  LABORATORY DATA:  I have reviewed the data as listed CBC Latest Ref Rng & Units 08/09/2021 05/10/2021 05/04/2021  WBC 4.0 - 10.5 K/uL 8.0 6.4 7.0  Hemoglobin 13.0 - 17.0 g/dL 12.4(L) 10.1(L) 9.5(L)  Hematocrit 39.0 - 52.0 %  37.8(L) 31.2(L) 28.9(L)  Platelets 150 - 400 K/uL 157 177 171    CMP Latest Ref Rng & Units 08/09/2021 05/10/2021 05/04/2021  Glucose 70 - 99 mg/dL 89 77 106(H)  BUN 6 - 20 mg/dL 10 15 15   Creatinine 0.61 - 1.24 mg/dL 1.09 1.25(H) 0.97  Sodium 135 - 145 mmol/L 134(L) 136 137  Potassium 3.5 - 5.1 mmol/L 3.5 3.9 4.2  Chloride 98 - 111 mmol/L 104 109 112(H)  CO2 22 - 32 mmol/L 22 18(L) 16(L)  Calcium 8.9 - 10.3 mg/dL 9.0 8.8(L) 8.5(L)  Total Protein 6.5 - 8.1 g/dL 7.4 7.4 6.7  Total Bilirubin 0.3 - 1.2 mg/dL 1.8(H) 1.6(H) 1.3(H)  Alkaline Phos 38 - 126 U/L 225(H) 220(H) 227(H)  AST 15 - 41 U/L 64(H) 62(H) 60(H)  ALT 0 - 44 U/L 22 18 16     Lab Results  Component Value Date   MPROTEIN 0.9 (H) 04/21/2021   Lab Results  Component Value Date   KPAFRELGTCHN 105.5 (H) 04/21/2021   KPAFRELGTCHN 109.8 (H) 03/31/2021   LAMBDASER 40.2 (H) 04/21/2021   LAMBDASER 35.1 (H) 03/31/2021   KAPLAMBRATIO 9.85 04/24/2021   KAPLAMBRATIO 2.62 (H) 04/21/2021   KAPLAMBRATIO 3.13 (H) 03/31/2021    RADIOGRAPHIC STUDIES: No results found.  ASSESSMENT & PLAN WARNER LADUCA 56 y.o. male with medical history significant for MGUS and iron deficiency anemia who presents for a follow up visit.   #Monoclonal gammopathy: --repeat CBC, CMP, SPEP/IFE,  kappa/lambda light chain q 6 months with UPEP and met survey yearly.   --DG bone met survey showed no clear  lytic lesions.  --bone marrow biopsy performed due to elevated serum free light chain ratio of 3.13. Performed on 05/04/2021, showed 4% plasma cells. Did not meet criteria for Multiple myeloma.   --RTC in 6 months    #Cirrhosis: --Diagnosed recently in September 2022 after presenting with AMS due to acute hepatic encephalopathy.  --Currently on Lactulose TID. --Patient follows with [facility]   #Iron deficiency anemia of Uncertain Etiology: --Iron panel from 05/10/2021 shows iron deficiency with iron saturation of 15%,  ferritin of 26.  -- Patient trialed on ferrous sulfate 325 mg once daily.  Minimal improvement on oral therapy -- proceeded with IV iron sucrose 200 mg q. 7 days x 5 doses from 05/19/21-06/27/2021. --Discussed with the patient to discuss the need for EGD/colonoscopy to rule out GI bleed, especially esophageal varices in the setting of cirrhosis.  --RTC in 3 months to assess iron stores.     No orders of the defined types were placed in this encounter.  All questions were answered. The patient knows to call the clinic with any problems, questions or concerns.  A total of more than 30 minutes were spent on this encounter with face-to-face time and non-face-to-face time, including preparing to see the patient, ordering tests and/or medications, counseling the patient and coordination of care as outlined above.   Ledell Peoples, MD Department of Hematology/Oncology [place] at [facility] Phone: [phone] Pager: [phone] Email: [email or web address]  08/12/2021 6:34 PM

## 2021-08-12 ENCOUNTER — Encounter: Payer: Self-pay | Admitting: Hematology and Oncology

## 2021-10-09 DIAGNOSIS — K7682 Hepatic encephalopathy: Secondary | ICD-10-CM | POA: Diagnosis not present

## 2021-10-11 DIAGNOSIS — K766 Portal hypertension: Secondary | ICD-10-CM | POA: Diagnosis not present

## 2021-10-11 DIAGNOSIS — K7469 Other cirrhosis of liver: Secondary | ICD-10-CM | POA: Diagnosis not present

## 2021-10-13 DIAGNOSIS — K7469 Other cirrhosis of liver: Secondary | ICD-10-CM | POA: Diagnosis not present

## 2021-10-13 DIAGNOSIS — K7460 Unspecified cirrhosis of liver: Secondary | ICD-10-CM | POA: Diagnosis not present

## 2021-10-13 DIAGNOSIS — Z79899 Other long term (current) drug therapy: Secondary | ICD-10-CM | POA: Diagnosis not present

## 2021-10-13 DIAGNOSIS — K7581 Nonalcoholic steatohepatitis (NASH): Secondary | ICD-10-CM | POA: Diagnosis not present

## 2021-10-13 DIAGNOSIS — K766 Portal hypertension: Secondary | ICD-10-CM | POA: Diagnosis not present

## 2021-10-20 DIAGNOSIS — K3189 Other diseases of stomach and duodenum: Secondary | ICD-10-CM | POA: Diagnosis not present

## 2021-10-20 DIAGNOSIS — K7460 Unspecified cirrhosis of liver: Secondary | ICD-10-CM | POA: Diagnosis not present

## 2021-10-20 DIAGNOSIS — D500 Iron deficiency anemia secondary to blood loss (chronic): Secondary | ICD-10-CM | POA: Diagnosis not present

## 2021-10-20 DIAGNOSIS — K319 Disease of stomach and duodenum, unspecified: Secondary | ICD-10-CM | POA: Diagnosis not present

## 2021-10-20 DIAGNOSIS — I8510 Secondary esophageal varices without bleeding: Secondary | ICD-10-CM | POA: Diagnosis not present

## 2021-10-20 DIAGNOSIS — G4733 Obstructive sleep apnea (adult) (pediatric): Secondary | ICD-10-CM | POA: Diagnosis not present

## 2021-10-20 DIAGNOSIS — K2950 Unspecified chronic gastritis without bleeding: Secondary | ICD-10-CM | POA: Diagnosis not present

## 2021-10-20 DIAGNOSIS — K766 Portal hypertension: Secondary | ICD-10-CM | POA: Diagnosis not present

## 2021-11-10 ENCOUNTER — Inpatient Hospital Stay: Payer: BC Managed Care – PPO

## 2021-11-10 ENCOUNTER — Other Ambulatory Visit: Payer: Self-pay | Admitting: Hematology and Oncology

## 2021-11-10 ENCOUNTER — Inpatient Hospital Stay: Payer: BC Managed Care – PPO | Attending: Physician Assistant | Admitting: Hematology and Oncology

## 2021-11-10 DIAGNOSIS — D472 Monoclonal gammopathy: Secondary | ICD-10-CM

## 2021-11-10 DIAGNOSIS — D500 Iron deficiency anemia secondary to blood loss (chronic): Secondary | ICD-10-CM

## 2021-12-04 ENCOUNTER — Telehealth: Payer: Self-pay | Admitting: Hematology and Oncology

## 2021-12-04 NOTE — Telephone Encounter (Signed)
Per 6/5 in basket called and left message for pt to reschedule missed appointment details and call back number left

## 2021-12-25 ENCOUNTER — Inpatient Hospital Stay: Payer: BC Managed Care – PPO | Attending: Physician Assistant

## 2021-12-25 ENCOUNTER — Other Ambulatory Visit: Payer: Self-pay

## 2021-12-25 ENCOUNTER — Inpatient Hospital Stay (HOSPITAL_BASED_OUTPATIENT_CLINIC_OR_DEPARTMENT_OTHER): Payer: BC Managed Care – PPO | Admitting: Hematology and Oncology

## 2021-12-25 ENCOUNTER — Inpatient Hospital Stay: Payer: BC Managed Care – PPO

## 2021-12-25 VITALS — BP 139/81 | HR 100 | Temp 97.0°F | Resp 15 | Wt 194.4 lb

## 2021-12-25 DIAGNOSIS — K7460 Unspecified cirrhosis of liver: Secondary | ICD-10-CM | POA: Insufficient documentation

## 2021-12-25 DIAGNOSIS — R300 Dysuria: Secondary | ICD-10-CM

## 2021-12-25 DIAGNOSIS — D472 Monoclonal gammopathy: Secondary | ICD-10-CM

## 2021-12-25 DIAGNOSIS — D509 Iron deficiency anemia, unspecified: Secondary | ICD-10-CM | POA: Insufficient documentation

## 2021-12-25 DIAGNOSIS — G8929 Other chronic pain: Secondary | ICD-10-CM | POA: Diagnosis not present

## 2021-12-25 DIAGNOSIS — F17220 Nicotine dependence, chewing tobacco, uncomplicated: Secondary | ICD-10-CM | POA: Diagnosis not present

## 2021-12-25 DIAGNOSIS — D500 Iron deficiency anemia secondary to blood loss (chronic): Secondary | ICD-10-CM

## 2021-12-25 LAB — CBC WITH DIFFERENTIAL (CANCER CENTER ONLY)
Abs Immature Granulocytes: 0.09 10*3/uL — ABNORMAL HIGH (ref 0.00–0.07)
Basophils Absolute: 0.1 10*3/uL (ref 0.0–0.1)
Basophils Relative: 1 %
Eosinophils Absolute: 0.2 10*3/uL (ref 0.0–0.5)
Eosinophils Relative: 2 %
HCT: 38.4 % — ABNORMAL LOW (ref 39.0–52.0)
Hemoglobin: 13 g/dL (ref 13.0–17.0)
Immature Granulocytes: 1 %
Lymphocytes Relative: 12 %
Lymphs Abs: 0.9 10*3/uL (ref 0.7–4.0)
MCH: 31.3 pg (ref 26.0–34.0)
MCHC: 33.9 g/dL (ref 30.0–36.0)
MCV: 92.3 fL (ref 80.0–100.0)
Monocytes Absolute: 1.3 10*3/uL — ABNORMAL HIGH (ref 0.1–1.0)
Monocytes Relative: 16 %
Neutro Abs: 5.3 10*3/uL (ref 1.7–7.7)
Neutrophils Relative %: 68 %
Platelet Count: 189 10*3/uL (ref 150–400)
RBC: 4.16 MIL/uL — ABNORMAL LOW (ref 4.22–5.81)
RDW: 14.6 % (ref 11.5–15.5)
WBC Count: 7.9 10*3/uL (ref 4.0–10.5)
nRBC: 0 % (ref 0.0–0.2)

## 2021-12-25 LAB — CMP (CANCER CENTER ONLY)
ALT: 22 U/L (ref 0–44)
AST: 58 U/L — ABNORMAL HIGH (ref 15–41)
Albumin: 3.7 g/dL (ref 3.5–5.0)
Alkaline Phosphatase: 225 U/L — ABNORMAL HIGH (ref 38–126)
Anion gap: 8 (ref 5–15)
BUN: 12 mg/dL (ref 6–20)
CO2: 20 mmol/L — ABNORMAL LOW (ref 22–32)
Calcium: 9.5 mg/dL (ref 8.9–10.3)
Chloride: 104 mmol/L (ref 98–111)
Creatinine: 1.18 mg/dL (ref 0.61–1.24)
GFR, Estimated: >60 mL/min (ref >60)
Glucose, Bld: 192 mg/dL — ABNORMAL HIGH (ref 70–99)
Potassium: 3.7 mmol/L (ref 3.5–5.1)
Sodium: 132 mmol/L — ABNORMAL LOW (ref 135–145)
Total Bilirubin: 1.5 mg/dL — ABNORMAL HIGH (ref 0.3–1.2)
Total Protein: 7.5 g/dL (ref 6.5–8.1)

## 2021-12-25 LAB — URINALYSIS, COMPLETE (UACMP) WITH MICROSCOPIC
Bilirubin Urine: NEGATIVE
Glucose, UA: NEGATIVE mg/dL
Hgb urine dipstick: NEGATIVE
Ketones, ur: NEGATIVE mg/dL
Nitrite: NEGATIVE
Protein, ur: NEGATIVE mg/dL
Specific Gravity, Urine: 1.008 (ref 1.005–1.030)
WBC, UA: >50 WBC/hpf — ABNORMAL HIGH (ref 0–5)
pH: 5 (ref 5.0–8.0)

## 2021-12-25 LAB — FERRITIN: Ferritin: 93 ng/mL (ref 24–336)

## 2021-12-25 LAB — RETIC PANEL
Immature Retic Fract: 8.9 % (ref 2.3–15.9)
RBC.: 4.12 MIL/uL — ABNORMAL LOW (ref 4.22–5.81)
Retic Count, Absolute: 62.2 10*3/uL (ref 19.0–186.0)
Retic Ct Pct: 1.5 % (ref 0.4–3.1)
Reticulocyte Hemoglobin: 34.5 pg (ref >27.9)

## 2021-12-25 LAB — IRON AND IRON BINDING CAPACITY (CC-WL,HP ONLY)
Iron: 204 ug/dL — ABNORMAL HIGH (ref 45–182)
Saturation Ratios: 50 % — ABNORMAL HIGH (ref 17.9–39.5)
TIBC: 412 ug/dL (ref 250–450)
UIBC: 208 ug/dL (ref 117–376)

## 2021-12-26 ENCOUNTER — Telehealth: Payer: Self-pay | Admitting: Hematology and Oncology

## 2021-12-26 ENCOUNTER — Telehealth: Payer: Self-pay | Admitting: *Deleted

## 2021-12-26 LAB — KAPPA/LAMBDA LIGHT CHAINS
Kappa free light chain: 75.4 mg/L — ABNORMAL HIGH (ref 3.3–19.4)
Kappa, lambda light chain ratio: 2.62 — ABNORMAL HIGH (ref 0.26–1.65)
Lambda free light chains: 28.8 mg/L — ABNORMAL HIGH (ref 5.7–26.3)

## 2021-12-26 LAB — URINE CULTURE: Culture: NO GROWTH

## 2021-12-28 LAB — MULTIPLE MYELOMA PANEL, SERUM
Albumin SerPl Elph-Mcnc: 3.3 g/dL (ref 2.9–4.4)
Albumin/Glob SerPl: 0.9 (ref 0.7–1.7)
Alpha 1: 0.2 g/dL (ref 0.0–0.4)
Alpha2 Glob SerPl Elph-Mcnc: 0.6 g/dL (ref 0.4–1.0)
B-Globulin SerPl Elph-Mcnc: 1.2 g/dL (ref 0.7–1.3)
Gamma Glob SerPl Elph-Mcnc: 1.6 g/dL (ref 0.4–1.8)
Globulin, Total: 3.7 g/dL (ref 2.2–3.9)
IgA: 648 mg/dL — ABNORMAL HIGH (ref 90–386)
IgG (Immunoglobin G), Serum: 1595 mg/dL (ref 603–1613)
IgM (Immunoglobulin M), Srm: 72 mg/dL (ref 20–172)
M Protein SerPl Elph-Mcnc: 1.1 g/dL — ABNORMAL HIGH
Total Protein ELP: 7 g/dL (ref 6.0–8.5)

## 2022-02-09 DIAGNOSIS — B029 Zoster without complications: Secondary | ICD-10-CM | POA: Diagnosis not present

## 2022-02-22 DIAGNOSIS — Z79899 Other long term (current) drug therapy: Secondary | ICD-10-CM | POA: Diagnosis not present

## 2022-02-22 DIAGNOSIS — F1021 Alcohol dependence, in remission: Secondary | ICD-10-CM | POA: Diagnosis not present

## 2022-02-22 DIAGNOSIS — Z23 Encounter for immunization: Secondary | ICD-10-CM | POA: Diagnosis not present

## 2022-02-22 DIAGNOSIS — K862 Cyst of pancreas: Secondary | ICD-10-CM | POA: Diagnosis not present

## 2022-02-22 DIAGNOSIS — K7469 Other cirrhosis of liver: Secondary | ICD-10-CM | POA: Diagnosis not present

## 2022-02-22 DIAGNOSIS — F17290 Nicotine dependence, other tobacco product, uncomplicated: Secondary | ICD-10-CM | POA: Diagnosis not present

## 2022-02-22 DIAGNOSIS — R772 Abnormality of alphafetoprotein: Secondary | ICD-10-CM | POA: Diagnosis not present

## 2022-03-29 DIAGNOSIS — Z23 Encounter for immunization: Secondary | ICD-10-CM | POA: Diagnosis not present

## 2022-05-30 DIAGNOSIS — R350 Frequency of micturition: Secondary | ICD-10-CM | POA: Diagnosis not present

## 2022-05-30 DIAGNOSIS — L309 Dermatitis, unspecified: Secondary | ICD-10-CM | POA: Diagnosis not present

## 2022-05-30 DIAGNOSIS — R310 Gross hematuria: Secondary | ICD-10-CM | POA: Diagnosis not present

## 2022-06-01 ENCOUNTER — Encounter: Payer: Self-pay | Admitting: Emergency Medicine

## 2022-06-01 ENCOUNTER — Ambulatory Visit
Admission: EM | Admit: 2022-06-01 | Discharge: 2022-06-01 | Disposition: A | Discharge status: Home / Self Care | Payer: BC Managed Care – PPO

## 2022-06-01 DIAGNOSIS — L03116 Cellulitis of left lower limb: Secondary | ICD-10-CM | POA: Diagnosis not present

## 2022-06-01 MED ORDER — CEPHALEXIN 500 MG PO CAPS: 500.0000 mg | ORAL_CAPSULE | Freq: Two times a day (BID) | ORAL | 0 refills | Status: DC

## 2022-06-01 NOTE — Discharge Instructions (Signed)
Suspect your symptoms are related to either a cellulitis of the area or a superficial thrombophlebitis so we will place you on a course of antibiotics and have you do warm compresses, leg elevation and monitor closely for improvement.  If your symptoms continue to worsen go to the emergency department for further evaluation.

## 2022-06-01 NOTE — ED Triage Notes (Signed)
Red area on left lower leg that started today.  States area is painful and swollen.  Was started on an antibiotic for a UTI on Wednesday (Cipro)

## 2022-06-04 DIAGNOSIS — K7469 Other cirrhosis of liver: Secondary | ICD-10-CM | POA: Diagnosis not present

## 2022-06-04 DIAGNOSIS — L02416 Cutaneous abscess of left lower limb: Secondary | ICD-10-CM | POA: Diagnosis not present

## 2022-06-05 NOTE — ED Provider Notes (Signed)
[facility]    CSN: [id number] Arrival date & time: 06/01/22  1941      History   Chief Complaint No chief complaint on file.   HPI Benjamin Knight is a 56 y.o. male.   Presenting today with left lower leg redness, pain, warmth to an area of anterior/medial lower leg. Denies known injury, numbness, tingling, decreased ROM. So far not trying anything OTC for sxs. States currently on cipro for a UTI.     Past Medical History:  Diagnosis Date   Bipolar disorder ([place])    Chronic back pain    Cirrhosis ([place])    Depression     Patient Active Problem List   Diagnosis Date Noted   Iron deficiency anemia 05/10/2021   Seasonal allergic rhinitis 05/03/2021   Moderate recurrent major depression ([place]) 05/03/2021   Lactose intolerance 05/03/2021   History of alcohol abuse 05/03/2021   Gastritis 05/03/2021   Cirrhosis of liver ([place]) 05/03/2021   Chronic diarrhea 05/03/2021   Celiac disease 05/03/2021   Blood in feces 05/03/2021   Ascites 05/03/2021   Anxiety 82/42/3536   Alcoholic cirrhosis ([place]) 14/43/1540   Monoclonal paraproteinemia 05/03/2021   Encephalopathy, hepatic 04/01/2021   AMS (altered mental status) 03/30/2021   Genetic testing 09/23/2020   Obstructive sleep apnea 12/03/2007    Past Surgical History:  Procedure Laterality Date   APPENDECTOMY         Home Medications    Prior to Admission medications   Medication Sig Start Date End Date Taking? Authorizing Provider  cephALEXin (KEFLEX) 500 MG capsule Take 1 capsule (500 mg total) by mouth 2 (two) times daily. 06/01/22  Yes Volney American, PA-C  ciprofloxacin (CIPRO) 250 MG tablet Take 250 mg by mouth 2 (two) times daily.   Yes [provider]  ferrous sulfate 325 (65 FE) MG tablet Take 1 tablet (325 mg total) by mouth daily with breakfast. Please take with a source of Vitamin C 08/09/21   Orson Slick, MD  folic acid (FOLVITE) 1 MG tablet Take 1 tablet (1 mg total) by  mouth daily. 04/03/21   Jose Persia, MD  furosemide (LASIX) 20 MG tablet Take 20 mg by mouth daily. 04/06/21   [provider]  lactulose (CHRONULAC) 10 GM/15ML solution Take 45 mLs (30 g total) by mouth 3 (three) times daily. Titrate to a goal of 2-3 bowel movements per day. 04/02/21   Jose Persia, MD  pantoprazole (PROTONIX) 40 MG tablet Take 1 tablet (40 mg total) by mouth daily. 04/03/21   Jose Persia, MD  PEPCID 20 MG tablet Take 10-20 mg by mouth 2 (two) times daily as needed for heartburn or indigestion.    [provider]  spironolactone (ALDACTONE) 50 MG tablet Take 1 tablet (50 mg total) by mouth daily. 04/03/21   Jose Persia, MD  thiamine 100 MG tablet Take 1 tablet (100 mg total) by mouth daily. 04/03/21   Jose Persia, MD    Family History Family History  Problem Relation Age of Onset   Heart disease Mother     Social History Social History   Tobacco Use   Smoking status: Never   Smokeless tobacco: Current    Types: Chew  Substance Use Topics   Alcohol use: Yes    Comment: Per wife, he has an extensive history of alcohol use with prior attempts of quitting. However, recently noted to be drinking up to 1/5 liquor on weekends and half of 1/5 liquor  daily on weekdays until Labor day weekend.   Drug use: Never     Allergies   Sertraline   Review of Systems Review of Systems PER HPI  Physical Exam Triage Vital Signs ED Triage Vitals  Enc Vitals Group     BP 06/01/22 1949 (!) 141/86     Pulse Rate 06/01/22 1949 95     Resp 06/01/22 1949 18     Temp 06/01/22 1949 98 F (36.7 C)     Temp Source 06/01/22 1949 Oral     SpO2 06/01/22 1949 94 %     Weight --      Height --      Head Circumference --      Peak Flow --      Pain Score 06/01/22 1950 7     Pain Loc --      Pain Edu? --      Excl. in [place]? --    No data found.  Updated Vital Signs BP (!) 141/86 (BP Location: Right Arm)   Pulse 95   Temp 98 F (36.7 C) (Oral)    Resp 18   SpO2 94%   Visual Acuity Right Eye Distance:   Left Eye Distance:   Bilateral Distance:    Right Eye Near:   Left Eye Near:    Bilateral Near:     Physical Exam Vitals and nursing note reviewed.  Constitutional:      Appearance: Normal appearance.  HENT:     Head: Atraumatic.  Eyes:     Extraocular Movements: Extraocular movements intact.     Conjunctiva/sclera: Conjunctivae normal.  Cardiovascular:     Rate and Rhythm: Normal rate and regular rhythm.  Pulmonary:     Effort: Pulmonary effort is normal.     Breath sounds: Normal breath sounds.  Musculoskeletal:        General: Tenderness present. No swelling, deformity or signs of injury. Normal range of motion.     Cervical back: Normal range of motion and neck supple.     Comments: Neg homans sign and squeeze test LLE  Skin:    General: Skin is warm and dry.     Findings: Erythema present.     Comments: Localized area of warmth, erythema, ttp left lower anterior/medial leg  Neurological:     General: No focal deficit present.     Mental Status: He is oriented to person, place, and time.     Comments: LLE neurovascularly intact  Psychiatric:        Mood and Affect: Mood normal.        Thought Content: Thought content normal.        Judgment: Judgment normal.      UC Treatments / Results  Labs (all labs ordered are listed, but only abnormal results are displayed) Labs Reviewed - No data to display  EKG   Radiology No results found.  Procedures Procedures (including critical care time)  Medications Ordered in UC Medications - No data to display  Initial Impression / Assessment and Plan / UC Course  I have reviewed the triage vital signs and the nursing notes.  Pertinent labs & imaging results that were available during my care of the patient were reviewed by me and considered in my medical decision making (see chart for details).     Suspect cellulitis vs superficial thrombophlebitis.  Treat with abx, warm compresses, leg elevation, OTC pain relievers. Return for worsening sxs.  Final Clinical Impressions(s) / UC Diagnoses  Final diagnoses:  Cellulitis of left lower extremity     Discharge Instructions      Suspect your symptoms are related to either a cellulitis of the area or a superficial thrombophlebitis so we will place you on a course of antibiotics and have you do warm compresses, leg elevation and monitor closely for improvement.  If your symptoms continue to worsen go to the emergency department for further evaluation.    ED Prescriptions     Medication Sig Dispense Auth. Provider   cephALEXin (KEFLEX) 500 MG capsule Take 1 capsule (500 mg total) by mouth 2 (two) times daily. 14 capsule Volney American, [place]      PDMP not reviewed this encounter.   Volney American, [place] 06/05/22 2251

## 2022-06-15 DIAGNOSIS — L0291 Cutaneous abscess, unspecified: Secondary | ICD-10-CM | POA: Diagnosis not present

## 2022-06-21 ENCOUNTER — Telehealth: Payer: Self-pay

## 2022-06-21 NOTE — Patient Outreach (Signed)
  Care Coordination   06/21/2022 Name: Benjamin Knight MRN: [id number] DOB: [birth year]   Care Coordination Outreach Attempts:  An unsuccessful telephone outreach was attempted today to offer the patient information about available care coordination services as a benefit of their health plan.   Follow Up Plan:  Additional outreach attempts will be made to offer the patient care coordination information and services.   Encounter Outcome:  No Answer   Care Coordination Interventions:  No, not indicated    Peter Garter RN, BSN,CCM, CDE Care Management Coordinator [place] Management [phone]

## 2022-06-27 ENCOUNTER — Inpatient Hospital Stay: Payer: BC Managed Care – PPO | Admitting: Hematology and Oncology

## 2022-06-27 ENCOUNTER — Inpatient Hospital Stay: Payer: BC Managed Care – PPO | Attending: Hematology and Oncology

## 2022-06-27 ENCOUNTER — Other Ambulatory Visit: Payer: Self-pay | Admitting: Hematology and Oncology

## 2022-06-27 DIAGNOSIS — D500 Iron deficiency anemia secondary to blood loss (chronic): Secondary | ICD-10-CM

## 2022-06-27 DIAGNOSIS — D472 Monoclonal gammopathy: Secondary | ICD-10-CM

## 2022-07-10 DIAGNOSIS — L299 Pruritus, unspecified: Secondary | ICD-10-CM | POA: Diagnosis not present

## 2022-07-10 DIAGNOSIS — L97921 Non-pressure chronic ulcer of unspecified part of left lower leg limited to breakdown of skin: Secondary | ICD-10-CM | POA: Diagnosis not present

## 2022-07-10 DIAGNOSIS — I872 Venous insufficiency (chronic) (peripheral): Secondary | ICD-10-CM | POA: Diagnosis not present

## 2022-07-10 DIAGNOSIS — K7469 Other cirrhosis of liver: Secondary | ICD-10-CM | POA: Diagnosis not present

## 2022-07-20 DIAGNOSIS — Z125 Encounter for screening for malignant neoplasm of prostate: Secondary | ICD-10-CM | POA: Diagnosis not present

## 2022-07-20 DIAGNOSIS — D472 Monoclonal gammopathy: Secondary | ICD-10-CM | POA: Diagnosis not present

## 2022-07-20 DIAGNOSIS — Z0000 Encounter for general adult medical examination without abnormal findings: Secondary | ICD-10-CM | POA: Diagnosis not present

## 2022-07-20 DIAGNOSIS — G4733 Obstructive sleep apnea (adult) (pediatric): Secondary | ICD-10-CM | POA: Diagnosis not present

## 2022-07-30 ENCOUNTER — Telehealth: Payer: Self-pay | Admitting: Hematology and Oncology

## 2022-07-30 NOTE — Telephone Encounter (Signed)
Patient called to r/s missed 12/27 appointments. Patient r/s and notified.

## 2022-07-31 ENCOUNTER — Telehealth: Payer: Self-pay

## 2022-07-31 NOTE — Patient Outreach (Signed)
  Care Coordination   07/31/2022 Name: DILAN NOVOSAD MRN: [id number] DOB: 1966/05/03   Care Coordination Outreach Attempts:  A second unsuccessful outreach was attempted today to offer the patient with information about available care coordination services as a benefit of their health plan.     Follow Up Plan:  Additional outreach attempts will be made to offer the patient care coordination information and services.   Encounter Outcome:  No Answer   Care Coordination Interventions:  No, not indicated    Peter Garter RN, BSN,CCM, CDE Care Management Coordinator [place] Management [phone]

## 2022-08-06 ENCOUNTER — Telehealth: Payer: Self-pay | Admitting: Hematology and Oncology

## 2022-08-06 NOTE — Telephone Encounter (Signed)
Per 2/1 IB, left msg with pt

## 2022-08-08 ENCOUNTER — Telehealth: Payer: Self-pay

## 2022-08-08 NOTE — Patient Outreach (Signed)
  Care Coordination   08/08/2022 Name: Benjamin Knight MRN: [id number] DOB: 1966/03/03   Care Coordination Outreach Attempts:  A third unsuccessful outreach was attempted today to offer the patient with information about available care coordination services as a benefit of their health plan.   Follow Up Plan:  No further outreach attempts will be made at this time. We have been unable to contact the patient to offer or enroll patient in care coordination services  Encounter Outcome:  No Answer   Care Coordination Interventions:  No, not indicated    Peter Garter RN, BSN,CCM, [place] Management [phone]

## 2022-08-10 ENCOUNTER — Other Ambulatory Visit: Payer: Self-pay | Admitting: Hematology and Oncology

## 2022-08-23 ENCOUNTER — Other Ambulatory Visit: Payer: BC Managed Care – PPO

## 2022-08-23 ENCOUNTER — Ambulatory Visit: Payer: BC Managed Care – PPO | Admitting: Hematology and Oncology

## 2022-08-23 DIAGNOSIS — K7469 Other cirrhosis of liver: Secondary | ICD-10-CM | POA: Diagnosis not present

## 2022-08-23 DIAGNOSIS — K766 Portal hypertension: Secondary | ICD-10-CM | POA: Diagnosis not present

## 2022-08-23 DIAGNOSIS — K862 Cyst of pancreas: Secondary | ICD-10-CM | POA: Diagnosis not present

## 2022-08-28 DIAGNOSIS — I872 Venous insufficiency (chronic) (peripheral): Secondary | ICD-10-CM | POA: Diagnosis not present

## 2022-08-30 ENCOUNTER — Inpatient Hospital Stay: Payer: BC Managed Care – PPO

## 2022-08-30 ENCOUNTER — Inpatient Hospital Stay: Payer: BC Managed Care – PPO | Attending: Hematology and Oncology | Admitting: Hematology and Oncology

## 2022-08-30 VITALS — BP 119/76 | HR 81 | Temp 98.1°F | Resp 16 | Wt 207.6 lb

## 2022-08-30 DIAGNOSIS — K7460 Unspecified cirrhosis of liver: Secondary | ICD-10-CM | POA: Diagnosis not present

## 2022-08-30 DIAGNOSIS — D472 Monoclonal gammopathy: Secondary | ICD-10-CM

## 2022-08-30 DIAGNOSIS — D500 Iron deficiency anemia secondary to blood loss (chronic): Secondary | ICD-10-CM

## 2022-08-30 DIAGNOSIS — D509 Iron deficiency anemia, unspecified: Secondary | ICD-10-CM | POA: Diagnosis not present

## 2022-08-30 LAB — CBC WITH DIFFERENTIAL (CANCER CENTER ONLY)
Abs Immature Granulocytes: 0.07 10*3/uL (ref 0.00–0.07)
Basophils Absolute: 0.1 10*3/uL (ref 0.0–0.1)
Basophils Relative: 1 %
Eosinophils Absolute: 0.4 10*3/uL (ref 0.0–0.5)
Eosinophils Relative: 4 %
HCT: 41.1 % (ref 39.0–52.0)
Hemoglobin: 14.1 g/dL (ref 13.0–17.0)
Immature Granulocytes: 1 %
Lymphocytes Relative: 16 %
Lymphs Abs: 1.5 10*3/uL (ref 0.7–4.0)
MCH: 31.4 pg (ref 26.0–34.0)
MCHC: 34.3 g/dL (ref 30.0–36.0)
MCV: 91.5 fL (ref 80.0–100.0)
Monocytes Absolute: 2 10*3/uL — ABNORMAL HIGH (ref 0.1–1.0)
Monocytes Relative: 21 %
Neutro Abs: 5.3 10*3/uL (ref 1.7–7.7)
Neutrophils Relative %: 57 %
Platelet Count: 189 10*3/uL (ref 150–400)
RBC: 4.49 MIL/uL (ref 4.22–5.81)
RDW: 14.2 % (ref 11.5–15.5)
WBC Count: 9.3 10*3/uL (ref 4.0–10.5)
nRBC: 0 % (ref 0.0–0.2)

## 2022-08-30 LAB — CMP (CANCER CENTER ONLY)
ALT: 21 U/L (ref 0–44)
AST: 57 U/L — ABNORMAL HIGH (ref 15–41)
Albumin: 3.8 g/dL (ref 3.5–5.0)
Alkaline Phosphatase: 266 U/L — ABNORMAL HIGH (ref 38–126)
Anion gap: 9 (ref 5–15)
BUN: 14 mg/dL (ref 6–20)
CO2: 21 mmol/L — ABNORMAL LOW (ref 22–32)
Calcium: 9.1 mg/dL (ref 8.9–10.3)
Chloride: 104 mmol/L (ref 98–111)
Creatinine: 1.12 mg/dL (ref 0.61–1.24)
GFR, Estimated: >60 mL/min (ref >60)
Glucose, Bld: 126 mg/dL — ABNORMAL HIGH (ref 70–99)
Potassium: 3.9 mmol/L (ref 3.5–5.1)
Sodium: 134 mmol/L — ABNORMAL LOW (ref 135–145)
Total Bilirubin: 1.4 mg/dL — ABNORMAL HIGH (ref 0.3–1.2)
Total Protein: 7.3 g/dL (ref 6.5–8.1)

## 2022-08-30 LAB — IRON AND IRON BINDING CAPACITY (CC-WL,HP ONLY)
Iron: 138 ug/dL (ref 45–182)
Saturation Ratios: 33 % (ref 17.9–39.5)
TIBC: 424 ug/dL (ref 250–450)
UIBC: 286 ug/dL (ref 117–376)

## 2022-08-30 LAB — RETIC PANEL
Immature Retic Fract: 12.4 % (ref 2.3–15.9)
RBC.: 4.42 MIL/uL (ref 4.22–5.81)
Retic Count, Absolute: 71.2 10*3/uL (ref 19.0–186.0)
Retic Ct Pct: 1.6 % (ref 0.4–3.1)
Reticulocyte Hemoglobin: 36.6 pg (ref >27.9)

## 2022-08-30 LAB — FERRITIN: Ferritin: 72 ng/mL (ref 24–336)

## 2022-08-30 NOTE — Progress Notes (Signed)
[place] [phone]   [phone]) [phone]  PROGRESS NOTE  Patient Care Team: Johna Roles, PA as PCP - General (Internal Medicine)  Hematological/Oncological History # IgG Kappa Monoclonal Gammopathy of Undetermined Significance # Iron Deficiency Anemia  1) 03/31/2021:  -SPEP detected M-protein 1.2 g/dL. Serum free light chains showed kappa light chain 109.8 mg/L, lambda light chains 35.1 mg/L and elevated ratio of 3.13.  -Vitamin B12 level 1,095, Ferritin 27, Iron 72, TIBC 514 (H), Iron saturation 14% (L), folate 12.8.   2) 04/01/2021: WBC 10.6 (H), Hgb 10.5 (L), MCV 82.7, Plt 228, Creatinine 1.15, Calcium 8.5 (L).    3) 04/21/2021: Establish care with [facility] Hematology/Oncology  Interval History:  Benjamin Knight 57 y.o. male with medical history significant for MGUS and iron deficiency anemia who presents for a follow up visit. The patient's last visit was on 08/09/2021.  In the interim since his last visit he has had no major changes in his health.  On exam today Benjamin Knight reports he has been well overall and months ago.  He reports that he visited with [facility] last Thursday and that his liver health is good.  He reports that his energy levels have been good since his last visit, he currently ranks them as about 9 out of 10.  He notes that he is able to do all of his day-to-day tasks without any difficulty.  He reports no bleeding, bruising, or dark stools.  He is not having a trouble with nausea, vomiting, or diarrhea.  He denies any new bone pain or back pain.  He also reports that his urine is not dark or having any bubbling.  He is not having any burning or dysuria at this time.  He continues to take his iron pills without difficulty and is not causing any constipation or stomach upset.  Fortunately he has not had any COVID or RSV infections in the interim since her last visit.  Overall he is at his baseline level of health with no questions, comments or  concerns today.  He otherwise denies any fevers, chills, sweats, nausea, vomiting or diarrhea.  Full 10 point ROS is listed below.  MEDICAL HISTORY:  Past Medical History:  Diagnosis Date   Bipolar disorder ([place])    Chronic back pain    Cirrhosis ([place])    Depression     SURGICAL HISTORY: Past Surgical History:  Procedure Laterality Date   APPENDECTOMY      SOCIAL HISTORY: Social History   Socioeconomic History   Marital status: Married    Spouse name: Not on file   Number of children: Not on file   Years of education: Not on file   Highest education level: Not on file  Occupational History   Not on file  Tobacco Use   Smoking status: Never   Smokeless tobacco: Current    Types: Chew  Substance and Sexual Activity   Alcohol use: Yes    Comment: Per wife, he has an extensive history of alcohol use with prior attempts of quitting. However, recently noted to be drinking up to 1/5 liquor on weekends and half of 1/5 liquor daily on weekdays until Labor day weekend.   Drug use: Never   Sexual activity: Not on file  Other Topics Concern   Not on file  Social History Narrative   Not on file   Social Determinants of Health   Financial Resource Strain: Not on file  Food Insecurity: Not on file  Transportation Needs: Not on file  Physical Activity: Not on file  Stress: Not on file  Social Connections: Not on file  Intimate Partner Violence: Not on file    FAMILY HISTORY: Family History  Problem Relation Age of Onset   Heart disease Mother     ALLERGIES:  is allergic to sertraline.  MEDICATIONS:  Current Outpatient Medications  Medication Sig Dispense Refill   clobetasol ointment (TEMOVATE) 0.05 % SMARTSIG:1 Topical Daily     ferrous sulfate 325 (65 FE) MG tablet TAKE 1 TABLET (325 MG TOTAL) BY MOUTH DAILY WITH BREAKFAST. PLEASE TAKE WITH A SOURCE OF VITAMIN C 90 tablet 3   folic acid (FOLVITE) 1 MG tablet Take 1 tablet (1 mg total) by mouth daily. 30 tablet 0    furosemide (LASIX) 20 MG tablet Take 20 mg by mouth daily.     lactulose (CHRONULAC) 10 GM/15ML solution Take 45 mLs (30 g total) by mouth 3 (three) times daily. Titrate to a goal of 2-3 bowel movements per day. 3784 mL 0   pantoprazole (PROTONIX) 40 MG tablet Take 1 tablet (40 mg total) by mouth daily. 30 tablet 0   PEPCID 20 MG tablet Take 10-20 mg by mouth 2 (two) times daily as needed for heartburn or indigestion.     spironolactone (ALDACTONE) 50 MG tablet Take 1 tablet (50 mg total) by mouth daily. 30 tablet 0   thiamine 100 MG tablet Take 1 tablet (100 mg total) by mouth daily. 30 tablet 0   No current facility-administered medications for this visit.    REVIEW OF SYSTEMS:   Constitutional: ( - ) fevers, ( - )  chills , ( - ) night sweats Eyes: ( - ) blurriness of vision, ( - ) double vision, ( - ) watery eyes Ears, nose, mouth, throat, and face: ( - ) mucositis, ( - ) sore throat Respiratory: ( - ) cough, ( - ) dyspnea, ( - ) wheezes Cardiovascular: ( - ) palpitation, ( - ) chest discomfort, ( - ) lower extremity swelling Gastrointestinal:  ( - ) nausea, ( - ) heartburn, ( - ) change in bowel habits Skin: ( - ) abnormal skin rashes Lymphatics: ( - ) new lymphadenopathy, ( - ) easy bruising Neurological: ( - ) numbness, ( - ) tingling, ( - ) new weaknesses Behavioral/Psych: ( - ) mood change, ( - ) new changes  All other systems were reviewed with the patient and are negative.  PHYSICAL EXAMINATION: ECOG PERFORMANCE STATUS: 1 - Symptomatic but completely ambulatory  Vitals:   08/30/22 1115  BP: 119/76  Pulse: 81  Resp: 16  Temp: 98.1 F (36.7 C)  SpO2: 98%     Filed Weights   08/30/22 1115  Weight: 207 lb 9 oz (94.1 kg)      GENERAL: Well-appearing middle-age Caucasian male, alert, no distress and comfortable SKIN: skin color, texture, turgor are normal, no rashes or significant lesions EYES: conjunctiva are pink and non-injected, sclera clear LUNGS: clear to  auscultation and percussion with normal breathing effort HEART: regular rate & rhythm and no murmurs and no lower extremity edema Musculoskeletal: no cyanosis of digits and no clubbing  PSYCH: alert & oriented x 3, fluent speech NEURO: no focal motor/sensory deficits  LABORATORY DATA:  I have reviewed the data as listed    Latest Ref Rng & Units 08/30/2022   10:54 AM 12/25/2021    9:34 AM 08/09/2021   10:32 AM  CBC  WBC 4.0 -  10.5 K/uL 9.3  7.9  8.0   Hemoglobin 13.0 - 17.0 g/dL 14.1  13.0  12.4   Hematocrit 39.0 - 52.0 % 41.1  38.4  37.8   Platelets 150 - 400 K/uL 189  189  157        Latest Ref Rng & Units 08/30/2022   10:54 AM 12/25/2021    9:34 AM 08/09/2021   10:32 AM  CMP  Glucose 70 - 99 mg/dL 126  192  89   BUN 6 - 20 mg/dL '14  12  10   '$ Creatinine 0.61 - 1.24 mg/dL 1.12  1.18  1.09   Sodium 135 - 145 mmol/L 134  132  134   Potassium 3.5 - 5.1 mmol/L 3.9  3.7  3.5   Chloride 98 - 111 mmol/L 104  104  104   CO2 22 - 32 mmol/L '21  20  22   '$ Calcium 8.9 - 10.3 mg/dL 9.1  9.5  9.0   Total Protein 6.5 - 8.1 g/dL 7.3  7.5  7.4   Total Bilirubin 0.3 - 1.2 mg/dL 1.4  1.5  1.8   Alkaline Phos 38 - 126 U/L 266  225  225   AST 15 - 41 U/L 57  58  64   ALT 0 - 44 U/L '21  22  22     '$ Lab Results  Component Value Date   MPROTEIN 1.1 (H) 12/25/2021   MPROTEIN 0.9 (H) 04/21/2021   Lab Results  Component Value Date   KPAFRELGTCHN 87.3 (H) 08/30/2022   KPAFRELGTCHN 75.4 (H) 12/25/2021   KPAFRELGTCHN 105.5 (H) 04/21/2021   LAMBDASER 30.7 (H) 08/30/2022   LAMBDASER 28.8 (H) 12/25/2021   LAMBDASER 40.2 (H) 04/21/2021   KAPLAMBRATIO 2.84 (H) 08/30/2022   KAPLAMBRATIO 2.62 (H) 12/25/2021   KAPLAMBRATIO 9.85 04/24/2021    RADIOGRAPHIC STUDIES: No results found.  ASSESSMENT & PLAN Benjamin Knight 57 y.o. male with medical history significant for MGUS and iron deficiency anemia who presents for a follow up visit.   #Monoclonal gammopathy of Uncertain Significance: --repeat CBC,  CMP, SPEP/IFE,  kappa/lambda light chain q 6 months with UPEP and met survey yearly.   --DG bone met survey showed no clear lytic lesions.  --bone marrow biopsy performed due to elevated serum free light chain ratio of 3.13. Performed on 05/04/2021, showed 4% plasma cells. Did not meet criteria for Multiple myeloma.   --M protein 1.1 with SFLC ratio of 2.84. Stable from prior.  --RTC in 6 months    #Cirrhosis: --Diagnosed recently in September 2022 after presenting with AMS due to acute hepatic encephalopathy.  --Currently on Lactulose TID. --Patient follows with [facility]   #Iron deficiency Anemia of Uncertain Etiology: --labs today show Hgb 14.1 with MCV 91.5 -- continue ferrous sulfate 325 mg once daily.   -- recieved IV iron sucrose 200 mg q. 7 days x 5 doses from 05/19/21-06/27/2021. --EGD performed on 10/20/2021, showed small esophageal varices and portal gastropathy  --RTC in 6 months to assess iron stores.    No orders of the defined types were placed in this encounter.  All questions were answered. The patient knows to call the clinic with any problems, questions or concerns.  A total of more than 30 minutes were spent on this encounter with face-to-face time and non-face-to-face time, including preparing to see the patient, ordering tests and/or medications, counseling the patient and coordination of care as outlined above.   Ledell Peoples, MD Department  of Hematology/Oncology [facility] Phone: [phone] Pager: [phone] Email: [email or web address]  09/02/2022 4:49 PM

## 2022-08-31 LAB — KAPPA/LAMBDA LIGHT CHAINS
Kappa free light chain: 87.3 mg/L — ABNORMAL HIGH (ref 3.3–19.4)
Kappa, lambda light chain ratio: 2.84 — ABNORMAL HIGH (ref 0.26–1.65)
Lambda free light chains: 30.7 mg/L — ABNORMAL HIGH (ref 5.7–26.3)

## 2022-09-05 DIAGNOSIS — U071 COVID-19: Secondary | ICD-10-CM | POA: Diagnosis not present

## 2022-09-05 LAB — MULTIPLE MYELOMA PANEL, SERUM
Albumin SerPl Elph-Mcnc: 3.5 g/dL (ref 2.9–4.4)
Albumin/Glob SerPl: 0.9 (ref 0.7–1.7)
Alpha 1: 0.2 g/dL (ref 0.0–0.4)
Alpha2 Glob SerPl Elph-Mcnc: 0.6 g/dL (ref 0.4–1.0)
B-Globulin SerPl Elph-Mcnc: 1.1 g/dL (ref 0.7–1.3)
Gamma Glob SerPl Elph-Mcnc: 1.9 g/dL — ABNORMAL HIGH (ref 0.4–1.8)
Globulin, Total: 3.9 g/dL (ref 2.2–3.9)
IgA: 717 mg/dL — ABNORMAL HIGH (ref 90–386)
IgG (Immunoglobin G), Serum: 1908 mg/dL — ABNORMAL HIGH (ref 603–1613)
IgM (Immunoglobulin M), Srm: 53 mg/dL (ref 20–172)
M Protein SerPl Elph-Mcnc: 0.8 g/dL — ABNORMAL HIGH
Total Protein ELP: 7.4 g/dL (ref 6.0–8.5)

## 2022-09-17 DIAGNOSIS — I872 Venous insufficiency (chronic) (peripheral): Secondary | ICD-10-CM | POA: Diagnosis not present

## 2022-09-17 DIAGNOSIS — L03115 Cellulitis of right lower limb: Secondary | ICD-10-CM | POA: Diagnosis not present

## 2022-09-17 DIAGNOSIS — L02415 Cutaneous abscess of right lower limb: Secondary | ICD-10-CM | POA: Diagnosis not present

## 2022-09-17 DIAGNOSIS — K7460 Unspecified cirrhosis of liver: Secondary | ICD-10-CM | POA: Diagnosis not present

## 2022-09-17 DIAGNOSIS — J019 Acute sinusitis, unspecified: Secondary | ICD-10-CM | POA: Diagnosis not present

## 2022-09-17 DIAGNOSIS — R739 Hyperglycemia, unspecified: Secondary | ICD-10-CM | POA: Diagnosis not present

## 2022-09-27 DIAGNOSIS — E871 Hypo-osmolality and hyponatremia: Secondary | ICD-10-CM | POA: Diagnosis not present

## 2022-09-27 DIAGNOSIS — E1165 Type 2 diabetes mellitus with hyperglycemia: Secondary | ICD-10-CM | POA: Diagnosis not present

## 2022-09-27 DIAGNOSIS — D72828 Other elevated white blood cell count: Secondary | ICD-10-CM | POA: Diagnosis not present

## 2022-10-10 ENCOUNTER — Encounter: Payer: Self-pay | Admitting: Hematology and Oncology

## 2022-11-05 ENCOUNTER — Ambulatory Visit (HOSPITAL_COMMUNITY)
Admission: RE | Admit: 2022-11-05 | Discharge: 2022-11-05 | Disposition: A | Discharge status: Home / Self Care | Payer: BC Managed Care – PPO | Source: Ambulatory Visit | Attending: Surgery | Admitting: Surgery

## 2022-11-05 ENCOUNTER — Other Ambulatory Visit (HOSPITAL_COMMUNITY): Payer: Self-pay | Admitting: Physician Assistant

## 2022-11-05 DIAGNOSIS — M7989 Other specified soft tissue disorders: Secondary | ICD-10-CM

## 2022-11-05 DIAGNOSIS — E1165 Type 2 diabetes mellitus with hyperglycemia: Secondary | ICD-10-CM | POA: Diagnosis not present

## 2022-11-13 IMAGING — DX DG BONE SURVEY MET
10 series · 10 of 10 positions shown · non-contrast
Comparison: MRI 03/30/2021. CT 03/30/2021. Chest x-ray 03/30/2021.

CLINICAL DATA: Monoclonal gammopathy.

EXAM:
METASTATIC BONE SURVEY

[skull lat]
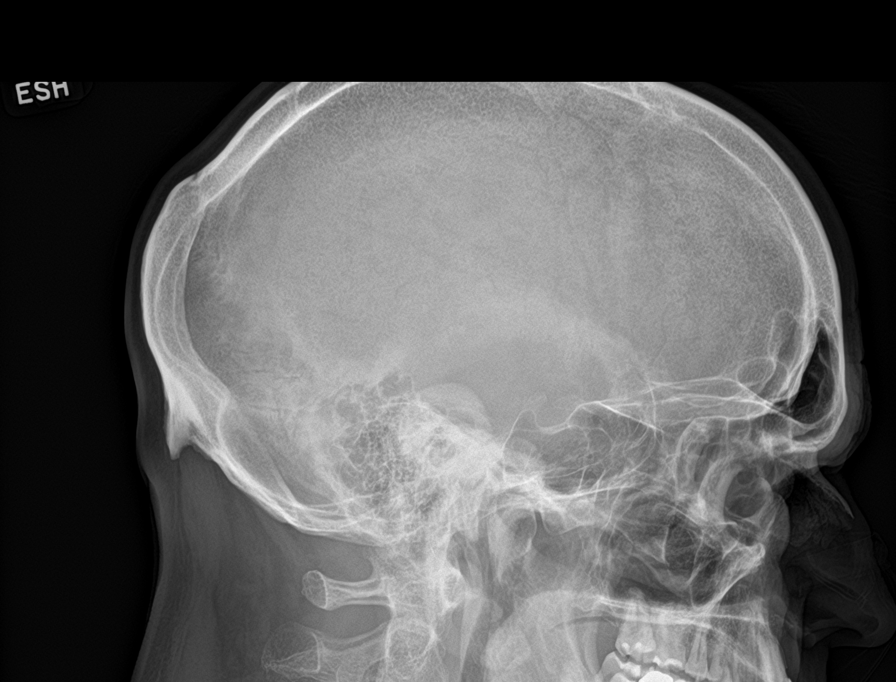

[shoulder ap (1 of 2)]
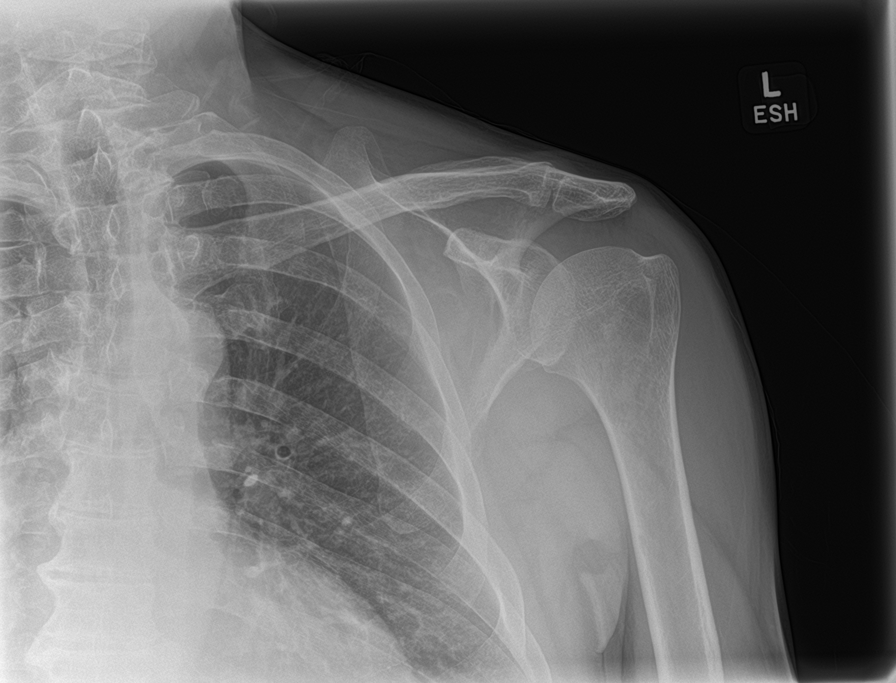

[shoulder ap (2 of 2)]
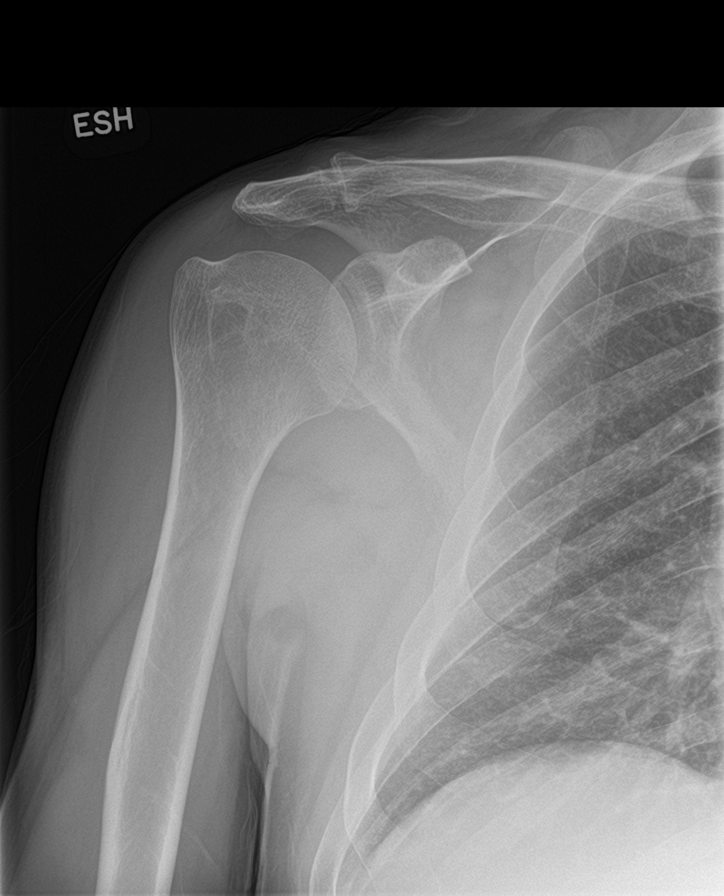

[humerus ap (1 of 2)]
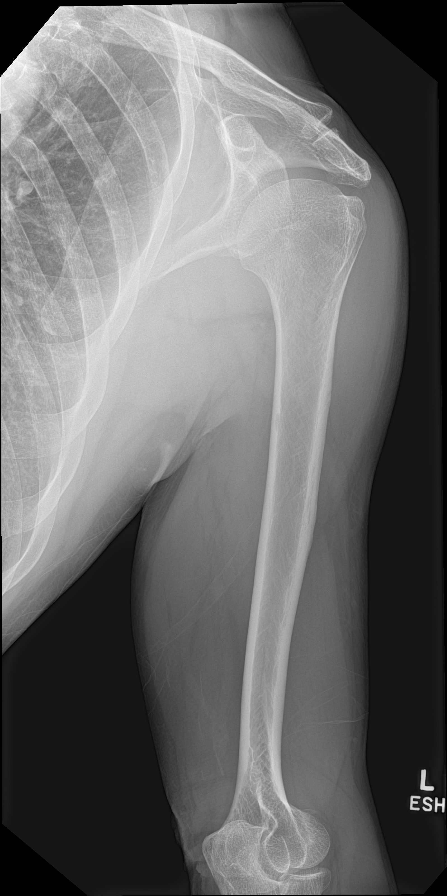

[humerus ap (2 of 2)]
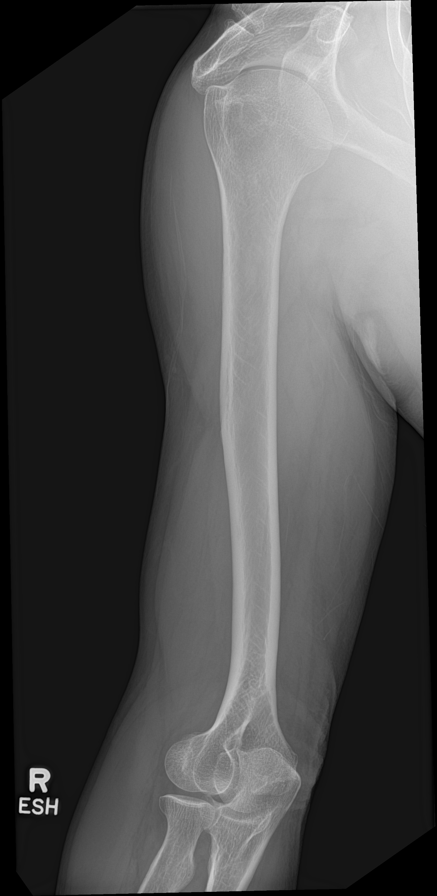

[forearm ap (1 of 2)]
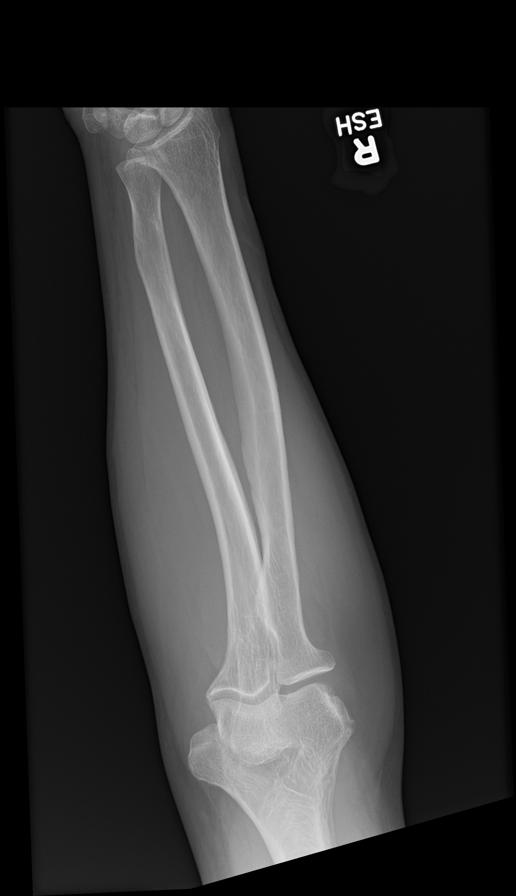

[forearm ap (2 of 2)]
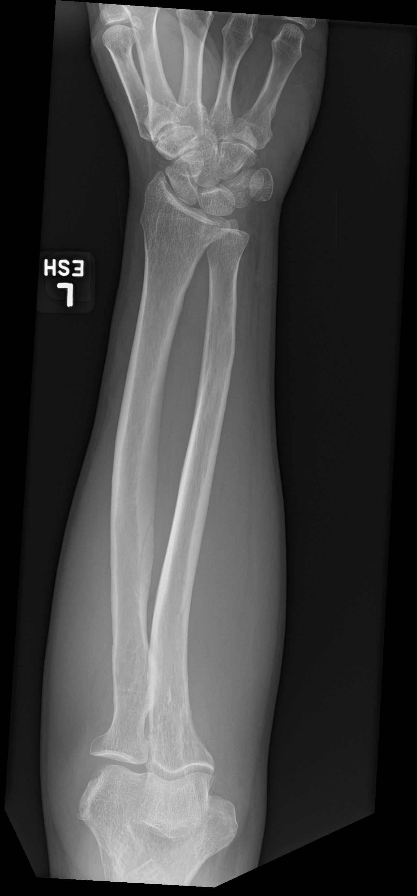

[c-spine ap]
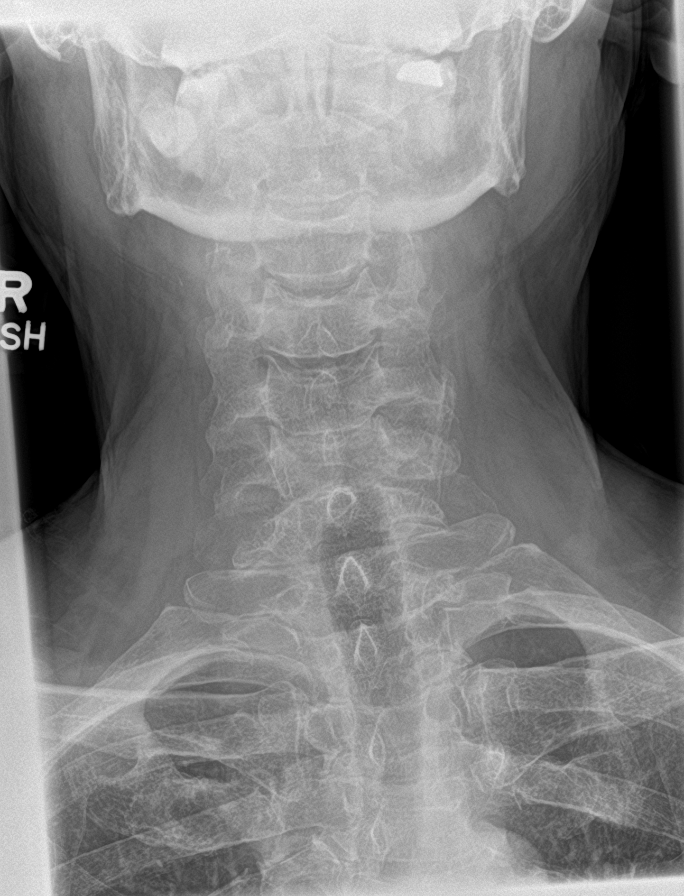

[c-spine lat]
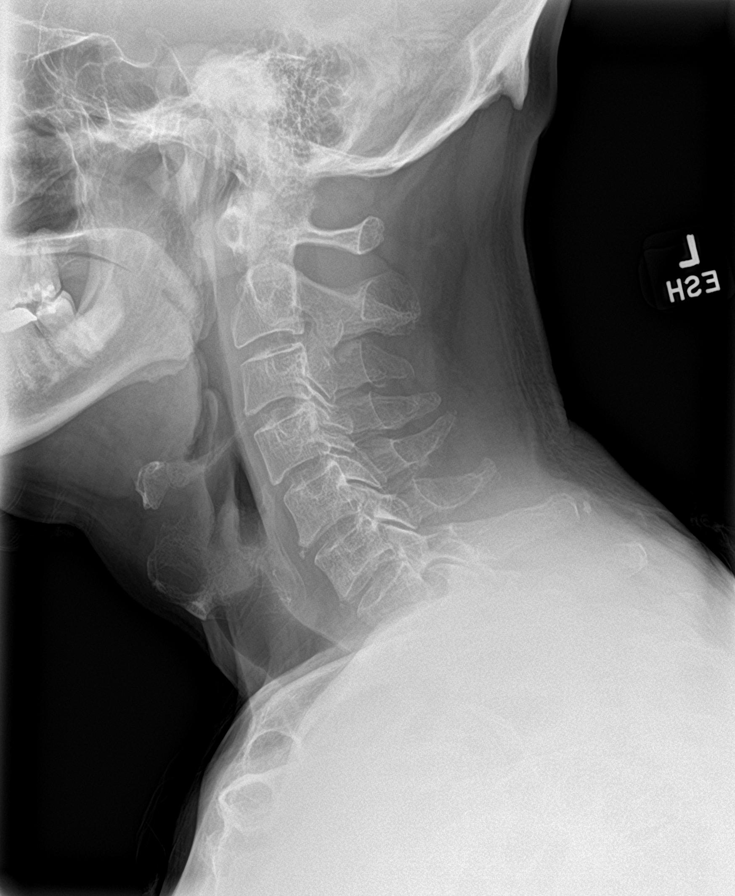

[t-spine ap]
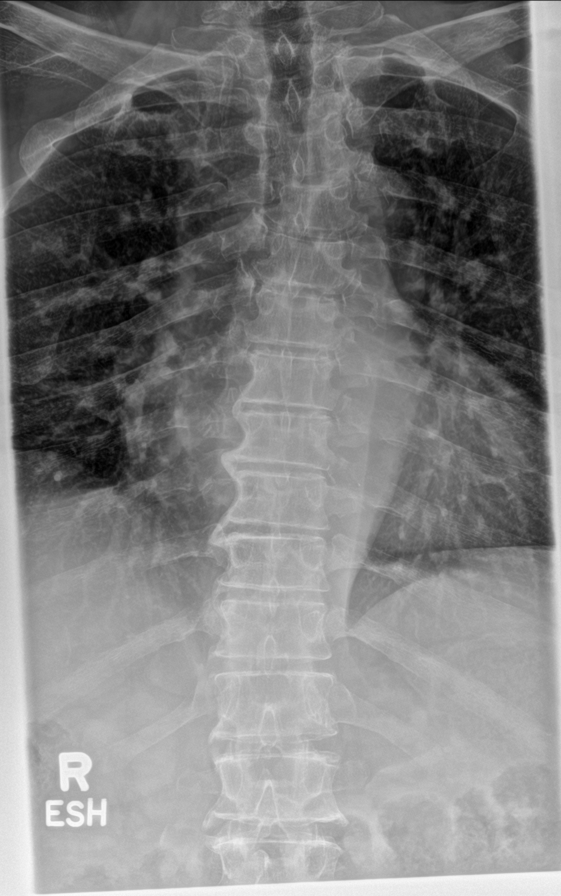

[10 of 10 positions shown; findings below may reference images not displayed]

FINDINGS: Standard imaging of the axial and appendicular skeleton performed. A
questionable lucency is noted in the proximal right humerus at the
metaphyseal epiphyseal junction. To exclude a focal proximal right
humeral lesion MRI of the right shoulder is suggested. No other
focal lytic lesions are identified. No sclerotic lesions are noted.
Degenerative changes noted of the thoracolumbar spine. No acute
cardiopulmonary disease identified. Aortoiliac atherosclerotic
vascular calcification noted.
IMPRESSION: A questionable lucency is noted in the proximal right humerus at the
metaphyseal epiphyseal junction. To exclude a focal proximal right
humeral lesion MRI of the right shoulder suggested. No other lytic
lesions identified.

## 2022-11-15 DIAGNOSIS — R7309 Other abnormal glucose: Secondary | ICD-10-CM | POA: Diagnosis not present

## 2022-11-15 DIAGNOSIS — L0390 Cellulitis, unspecified: Secondary | ICD-10-CM | POA: Diagnosis not present

## 2022-11-15 DIAGNOSIS — D72828 Other elevated white blood cell count: Secondary | ICD-10-CM | POA: Diagnosis not present

## 2022-12-25 ENCOUNTER — Inpatient Hospital Stay (HOSPITAL_COMMUNITY)
Admission: EM | Admit: 2022-12-25 | Discharge: 2022-12-27 | DRG: 432 | Disposition: A | Discharge status: Other Acute Inpatient Hospital | Payer: BC Managed Care – PPO | Attending: Pulmonary Disease | Admitting: Pulmonary Disease

## 2022-12-25 ENCOUNTER — Encounter (HOSPITAL_COMMUNITY): Payer: Self-pay

## 2022-12-25 ENCOUNTER — Emergency Department (HOSPITAL_COMMUNITY): Payer: BC Managed Care – PPO

## 2022-12-25 DIAGNOSIS — I272 Pulmonary hypertension, unspecified: Secondary | ICD-10-CM | POA: Diagnosis not present

## 2022-12-25 DIAGNOSIS — K7290 Hepatic failure, unspecified without coma: Secondary | ICD-10-CM | POA: Diagnosis not present

## 2022-12-25 DIAGNOSIS — F411 Generalized anxiety disorder: Secondary | ICD-10-CM | POA: Diagnosis present

## 2022-12-25 DIAGNOSIS — N179 Acute kidney failure, unspecified: Secondary | ICD-10-CM

## 2022-12-25 DIAGNOSIS — F1010 Alcohol abuse, uncomplicated: Secondary | ICD-10-CM | POA: Diagnosis not present

## 2022-12-25 DIAGNOSIS — G9341 Metabolic encephalopathy: Secondary | ICD-10-CM | POA: Diagnosis not present

## 2022-12-25 DIAGNOSIS — K7030 Alcoholic cirrhosis of liver without ascites: Secondary | ICD-10-CM | POA: Diagnosis not present

## 2022-12-25 DIAGNOSIS — J9601 Acute respiratory failure with hypoxia: Secondary | ICD-10-CM | POA: Diagnosis not present

## 2022-12-25 DIAGNOSIS — K7460 Unspecified cirrhosis of liver: Secondary | ICD-10-CM | POA: Diagnosis not present

## 2022-12-25 DIAGNOSIS — R195 Other fecal abnormalities: Secondary | ICD-10-CM

## 2022-12-25 DIAGNOSIS — E871 Hypo-osmolality and hyponatremia: Secondary | ICD-10-CM | POA: Diagnosis not present

## 2022-12-25 DIAGNOSIS — K767 Hepatorenal syndrome: Secondary | ICD-10-CM | POA: Diagnosis present

## 2022-12-25 DIAGNOSIS — I700 Atherosclerosis of aorta: Secondary | ICD-10-CM | POA: Diagnosis not present

## 2022-12-25 DIAGNOSIS — N200 Calculus of kidney: Secondary | ICD-10-CM | POA: Diagnosis present

## 2022-12-25 DIAGNOSIS — E876 Hypokalemia: Secondary | ICD-10-CM | POA: Diagnosis present

## 2022-12-25 DIAGNOSIS — R4182 Altered mental status, unspecified: Secondary | ICD-10-CM | POA: Diagnosis present

## 2022-12-25 DIAGNOSIS — I8510 Secondary esophageal varices without bleeding: Secondary | ICD-10-CM | POA: Diagnosis present

## 2022-12-25 DIAGNOSIS — K922 Gastrointestinal hemorrhage, unspecified: Secondary | ICD-10-CM

## 2022-12-25 DIAGNOSIS — R000 Tachycardia, unspecified: Secondary | ICD-10-CM | POA: Diagnosis not present

## 2022-12-25 DIAGNOSIS — K7581 Nonalcoholic steatohepatitis (NASH): Secondary | ICD-10-CM | POA: Diagnosis present

## 2022-12-25 DIAGNOSIS — D472 Monoclonal gammopathy: Secondary | ICD-10-CM | POA: Diagnosis present

## 2022-12-25 DIAGNOSIS — F1090 Alcohol use, unspecified, uncomplicated: Secondary | ICD-10-CM

## 2022-12-25 DIAGNOSIS — K3189 Other diseases of stomach and duodenum: Secondary | ICD-10-CM | POA: Diagnosis present

## 2022-12-25 DIAGNOSIS — K766 Portal hypertension: Secondary | ICD-10-CM | POA: Diagnosis not present

## 2022-12-25 DIAGNOSIS — K900 Celiac disease: Secondary | ICD-10-CM | POA: Diagnosis present

## 2022-12-25 DIAGNOSIS — K7682 Hepatic encephalopathy: Secondary | ICD-10-CM | POA: Diagnosis not present

## 2022-12-25 DIAGNOSIS — G4733 Obstructive sleep apnea (adult) (pediatric): Secondary | ICD-10-CM | POA: Diagnosis present

## 2022-12-25 DIAGNOSIS — G9340 Encephalopathy, unspecified: Secondary | ICD-10-CM | POA: Diagnosis present

## 2022-12-25 DIAGNOSIS — R579 Shock, unspecified: Secondary | ICD-10-CM | POA: Diagnosis not present

## 2022-12-25 DIAGNOSIS — E872 Acidosis, unspecified: Secondary | ICD-10-CM | POA: Diagnosis not present

## 2022-12-25 DIAGNOSIS — Z8659 Personal history of other mental and behavioral disorders: Secondary | ICD-10-CM

## 2022-12-25 DIAGNOSIS — Z8719 Personal history of other diseases of the digestive system: Secondary | ICD-10-CM

## 2022-12-25 DIAGNOSIS — Z4682 Encounter for fitting and adjustment of non-vascular catheter: Secondary | ICD-10-CM | POA: Diagnosis not present

## 2022-12-25 DIAGNOSIS — Z888 Allergy status to other drugs, medicaments and biological substances status: Secondary | ICD-10-CM

## 2022-12-25 DIAGNOSIS — M549 Dorsalgia, unspecified: Secondary | ICD-10-CM | POA: Diagnosis present

## 2022-12-25 DIAGNOSIS — R578 Other shock: Secondary | ICD-10-CM | POA: Diagnosis not present

## 2022-12-25 DIAGNOSIS — I2510 Atherosclerotic heart disease of native coronary artery without angina pectoris: Secondary | ICD-10-CM | POA: Diagnosis present

## 2022-12-25 DIAGNOSIS — R918 Other nonspecific abnormal finding of lung field: Secondary | ICD-10-CM | POA: Diagnosis not present

## 2022-12-25 DIAGNOSIS — R6510 Systemic inflammatory response syndrome (SIRS) of non-infectious origin without acute organ dysfunction: Secondary | ICD-10-CM | POA: Diagnosis present

## 2022-12-25 DIAGNOSIS — Z720 Tobacco use: Secondary | ICD-10-CM

## 2022-12-25 DIAGNOSIS — Z79899 Other long term (current) drug therapy: Secondary | ICD-10-CM

## 2022-12-25 DIAGNOSIS — N170 Acute kidney failure with tubular necrosis: Secondary | ICD-10-CM | POA: Diagnosis not present

## 2022-12-25 DIAGNOSIS — Z91148 Patient's other noncompliance with medication regimen for other reason: Secondary | ICD-10-CM

## 2022-12-25 DIAGNOSIS — F319 Bipolar disorder, unspecified: Secondary | ICD-10-CM | POA: Diagnosis present

## 2022-12-25 DIAGNOSIS — R0989 Other specified symptoms and signs involving the circulatory and respiratory systems: Secondary | ICD-10-CM | POA: Diagnosis not present

## 2022-12-25 DIAGNOSIS — I517 Cardiomegaly: Secondary | ICD-10-CM | POA: Diagnosis not present

## 2022-12-25 DIAGNOSIS — R109 Unspecified abdominal pain: Secondary | ICD-10-CM | POA: Diagnosis not present

## 2022-12-25 DIAGNOSIS — K920 Hematemesis: Secondary | ICD-10-CM

## 2022-12-25 DIAGNOSIS — Z8249 Family history of ischemic heart disease and other diseases of the circulatory system: Secondary | ICD-10-CM

## 2022-12-25 DIAGNOSIS — G8929 Other chronic pain: Secondary | ICD-10-CM | POA: Diagnosis present

## 2022-12-25 DIAGNOSIS — D509 Iron deficiency anemia, unspecified: Secondary | ICD-10-CM | POA: Diagnosis not present

## 2022-12-25 DIAGNOSIS — J984 Other disorders of lung: Secondary | ICD-10-CM | POA: Diagnosis not present

## 2022-12-25 DIAGNOSIS — Z452 Encounter for adjustment and management of vascular access device: Secondary | ICD-10-CM | POA: Diagnosis not present

## 2022-12-25 DIAGNOSIS — G9349 Other encephalopathy: Secondary | ICD-10-CM | POA: Diagnosis not present

## 2022-12-25 HISTORY — DX: Other diseases of stomach and duodenum: K31.89

## 2022-12-25 HISTORY — DX: Esophageal varices without bleeding: I85.00

## 2022-12-25 HISTORY — DX: Alcoholic cirrhosis of liver without ascites: K70.30

## 2022-12-25 LAB — I-STAT CHEM 8, ED
BUN: 37 mg/dL — ABNORMAL HIGH (ref 6–20)
Calcium, Ion: 1.37 mmol/L (ref 1.15–1.40)
Chloride: 120 mmol/L — ABNORMAL HIGH (ref 98–111)
Creatinine, Ser: 2.5 mg/dL — ABNORMAL HIGH (ref 0.61–1.24)
Glucose, Bld: 194 mg/dL — ABNORMAL HIGH (ref 70–99)
HCT: 47 % (ref 39.0–52.0)
Hemoglobin: 16 g/dL (ref 13.0–17.0)
Potassium: 3.7 mmol/L (ref 3.5–5.1)
Sodium: 137 mmol/L (ref 135–145)
TCO2: 7 mmol/L — ABNORMAL LOW (ref 22–32)

## 2022-12-25 LAB — COMPREHENSIVE METABOLIC PANEL
ALT: 19 U/L (ref 0–44)
AST: 43 U/L — ABNORMAL HIGH (ref 15–41)
Albumin: 3.9 g/dL (ref 3.5–5.0)
Alkaline Phosphatase: 211 U/L — ABNORMAL HIGH (ref 38–126)
BUN: 22 mg/dL — ABNORMAL HIGH (ref 6–20)
CO2: <7 mmol/L — ABNORMAL LOW (ref 22–32)
Calcium: 9.4 mg/dL (ref 8.9–10.3)
Chloride: 114 mmol/L — ABNORMAL HIGH (ref 98–111)
Creatinine, Ser: 2.51 mg/dL — ABNORMAL HIGH (ref 0.61–1.24)
GFR, Estimated: 29 mL/min — ABNORMAL LOW (ref >60)
Glucose, Bld: 204 mg/dL — ABNORMAL HIGH (ref 70–99)
Potassium: 2.3 mmol/L — CL (ref 3.5–5.1)
Sodium: 134 mmol/L — ABNORMAL LOW (ref 135–145)
Total Bilirubin: 5.7 mg/dL — ABNORMAL HIGH (ref 0.3–1.2)
Total Protein: 8.4 g/dL — ABNORMAL HIGH (ref 6.5–8.1)

## 2022-12-25 LAB — CBC
HCT: 45.3 % (ref 39.0–52.0)
Hemoglobin: 16 g/dL (ref 13.0–17.0)
MCH: 32.3 pg (ref 26.0–34.0)
MCHC: 35.3 g/dL (ref 30.0–36.0)
MCV: 91.5 fL (ref 80.0–100.0)
Platelets: 266 10*3/uL (ref 150–400)
RBC: 4.95 MIL/uL (ref 4.22–5.81)
RDW: 14.6 % (ref 11.5–15.5)
WBC: 18.7 10*3/uL — ABNORMAL HIGH (ref 4.0–10.5)
nRBC: 0 % (ref 0.0–0.2)

## 2022-12-25 LAB — AMMONIA: Ammonia: 308 umol/L — ABNORMAL HIGH (ref 9–35)

## 2022-12-25 LAB — CBG MONITORING, ED: Glucose-Capillary: 167 mg/dL — ABNORMAL HIGH (ref 70–99)

## 2022-12-25 MED ORDER — PANTOPRAZOLE 80MG IVPB - SIMPLE MED
80.0000 mg | Freq: Once | INTRAVENOUS | Status: AC
  Administered 2022-12-26: 80 mg via INTRAVENOUS
  Filled 2022-12-25: qty 100

## 2022-12-25 MED ORDER — LACTULOSE 10 GM/15ML PO SOLN: 30.0000 g | ORAL | Status: DC

## 2022-12-25 MED ORDER — LACTULOSE ENEMA
300.0000 mL | ORAL | Status: DC
  Filled 2022-12-25: qty 300

## 2022-12-25 MED ORDER — SODIUM CHLORIDE 0.9 % IV SOLN
1.0000 g | Freq: Once | INTRAVENOUS | Status: AC
  Administered 2022-12-26: 1 g via INTRAVENOUS
  Filled 2022-12-25: qty 10

## 2022-12-25 MED ORDER — SODIUM CHLORIDE 0.9 % IV BOLUS
1000.0000 mL | Freq: Once | INTRAVENOUS | Status: AC
  Administered 2022-12-25: 1000 mL via INTRAVENOUS

## 2022-12-25 MED ORDER — POTASSIUM CHLORIDE 10 MEQ/100ML IV SOLN
10.0000 meq | INTRAVENOUS | Status: AC
  Administered 2022-12-25 – 2022-12-26 (×4): 10 meq via INTRAVENOUS
  Filled 2022-12-25 (×3): qty 100

## 2022-12-25 MED ORDER — FENTANYL CITRATE PF 50 MCG/ML IJ SOSY
50.0000 ug | PREFILLED_SYRINGE | Freq: Once | INTRAMUSCULAR | Status: AC
  Administered 2022-12-25: 50 ug via INTRAVENOUS
  Filled 2022-12-25: qty 1

## 2022-12-25 MED ORDER — PANTOPRAZOLE INFUSION (NEW) - SIMPLE MED
8.0000 mg/h | INTRAVENOUS | Status: DC
  Administered 2022-12-26 (×2): 8 mg/h via INTRAVENOUS
  Filled 2022-12-25 (×3): qty 100

## 2022-12-25 NOTE — ED Provider Notes (Signed)
[place] EMERGENCY DEPARTMENT AT [facility] Provider Note   CSN: [id number] Arrival date & time: 12/25/22  2039     History {Add pertinent medical, surgical, social history, OB history to HPI:1} Chief Complaint  Patient presents with   Altered Mental Status    Benjamin Knight is a 57 y.o. male.  57 year old male with a history of alcohol induced cirrhosis and IgG kappa monoclonal gammopathy of undetermined significance who presents emergency department with altered mental status.  Patient's wife reports that on Friday she started noticing that her husband speech sounded different.  Over the weekend he started to become more confused.  Would occasionally become agitated and she noticed that he was having some tremors as well.  Today started vomiting coffee-ground emesis.  Started to become more weak and so she decided to bring him into the emergency department for evaluation.  Shortly after arriving he started complaining of pain everywhere and is unable to give an exact location.  She reports that he has not taken his lactulose in 3 weeks but has been compliant with his other medications.  Not on any blood thinners.  No known head trauma.  No focal weakness or facial droop. No alcohol in years. Wife gave him lactulose twice today prior to arrival.        Home Medications Prior to Admission medications   Medication Sig Start Date End Date Taking? Authorizing Provider  clobetasol ointment (TEMOVATE) 0.05 % SMARTSIG:1 Topical Daily 08/28/22   [provider]  ferrous sulfate 325 (65 FE) MG tablet TAKE 1 TABLET (325 MG TOTAL) BY MOUTH DAILY WITH BREAKFAST. PLEASE TAKE WITH A SOURCE OF VITAMIN C 08/10/22   Jaci Standard, MD  folic acid (FOLVITE) 1 MG tablet Take 1 tablet (1 mg total) by mouth daily. 04/03/21   Verdene Lennert, MD  furosemide (LASIX) 20 MG tablet Take 20 mg by mouth daily. 04/06/21   [provider]  lactulose (CHRONULAC) 10 GM/15ML solution  Take 45 mLs (30 g total) by mouth 3 (three) times daily. Titrate to a goal of 2-3 bowel movements per day. 04/02/21   Verdene Lennert, MD  pantoprazole (PROTONIX) 40 MG tablet Take 1 tablet (40 mg total) by mouth daily. 04/03/21   Verdene Lennert, MD  PEPCID 20 MG tablet Take 10-20 mg by mouth 2 (two) times daily as needed for heartburn or indigestion.    [provider]  spironolactone (ALDACTONE) 50 MG tablet Take 1 tablet (50 mg total) by mouth daily. 04/03/21   Verdene Lennert, MD  thiamine 100 MG tablet Take 1 tablet (100 mg total) by mouth daily. 04/03/21   Verdene Lennert, MD      Allergies    Sertraline    Review of Systems   Review of Systems  Physical Exam Updated Vital Signs BP 113/70 (BP Location: Left Arm)   Pulse 62   Temp 97.9 F (36.6 C)   Resp 18   SpO2 93%  Physical Exam Vitals and nursing note reviewed.  Constitutional:      General: He is in acute distress.     Appearance: He is well-developed. He is ill-appearing.     Comments: Is able to stop and answer questions and is alert and oriented x 3 but repetitively says "it hurts, it hurts" and will not specify where  HENT:     Head: Normocephalic and atraumatic.     Right Ear: External ear normal.     Left Ear: External ear  normal.     Nose: Nose normal.     Mouth/Throat:     Comments: Dried emesis in mouth Eyes:     Extraocular Movements: Extraocular movements intact.     Conjunctiva/sclera: Conjunctivae normal.     Pupils: Pupils are equal, round, and reactive to light.  Cardiovascular:     Rate and Rhythm: Regular rhythm. Tachycardia present.     Heart sounds: Normal heart sounds.  Pulmonary:     Effort: Pulmonary effort is normal. No respiratory distress.     Breath sounds: Normal breath sounds.  Abdominal:     General: There is distension.     Palpations: Abdomen is soft. There is no mass.     Tenderness: There is no abdominal tenderness. There is no guarding.  Musculoskeletal:     Cervical  back: Normal range of motion and neck supple.     Right lower leg: Edema present.     Left lower leg: Edema present.  Skin:    General: Skin is warm and dry.     Capillary Refill: Capillary refill takes more than 3 seconds.  Neurological:     Mental Status: He is alert and oriented to person, place, and time. Mental status is at baseline.     Comments: Cranial nerves appear grossly intact.  Pupils 4 mm and reactive bilaterally.  Full strength of bilateral upper extremities.  Patient not compliant with neurologic exam for lower extremities.  Psychiatric:        Mood and Affect: Mood normal.        Behavior: Behavior normal.     ED Results / Procedures / Treatments   Labs (all labs ordered are listed, but only abnormal results are displayed) Labs Reviewed  CBC - Abnormal; Notable for the following components:      Result Value   WBC 18.7 (*)    All other components within normal limits  COMPREHENSIVE METABOLIC PANEL  AMMONIA  CBG MONITORING, ED    EKG None  Radiology No results found.  Procedures Procedures  {Document cardiac monitor, telemetry assessment procedure when appropriate:1}  Medications Ordered in ED Medications - No data to display  ED Course/ Medical Decision Making/ A&P   {   Click here for ABCD2, HEART and other calculatorsREFRESH Note before signing :1}                          Medical Decision Making Amount and/or Complexity of Data Reviewed Labs: ordered. Radiology: ordered.  Risk Prescription drug management. Decision regarding hospitalization.   ***  {Document critical care time when appropriate:1} {Document review of labs and clinical decision tools ie heart score, Chads2Vasc2 etc:1}  {Document your independent review of radiology images, and any outside records:1} {Document your discussion with family members, caretakers, and with consultants:1} {Document social determinants of health affecting pt's care:1} {Document your decision  making why or why not admission, treatments were needed:1} Final Clinical Impression(s) / ED Diagnoses Final diagnoses:  None    Rx / DC Orders ED Discharge Orders     None

## 2022-12-25 NOTE — ED Triage Notes (Signed)
Pt has Hx of liver cirrhosis, with Hx of hepatic encephalopathy, pt has had a progression of the symptoms over the past week. Presenting with moaning, tachypnea, inability to answer questions effectively. Pt has slurred speech and poor motor skills with memory loss as well.

## 2022-12-25 NOTE — ED Notes (Signed)
Patient transported to CT 

## 2022-12-25 NOTE — ED Notes (Signed)
Pt yelling "oh god" repeatedly.  Pt keeps saying he is hurting all over but won't give specific location of pain.  Wife at bedside states this is very unusual behavior for pt.  EDP present at bedside.

## 2022-12-25 NOTE — ED Notes (Signed)
Pt now resting, no longer yelling.  Wife at bedside.

## 2022-12-26 ENCOUNTER — Emergency Department (HOSPITAL_COMMUNITY): Payer: BC Managed Care – PPO

## 2022-12-26 ENCOUNTER — Inpatient Hospital Stay (HOSPITAL_COMMUNITY): Payer: BC Managed Care – PPO

## 2022-12-26 ENCOUNTER — Encounter (HOSPITAL_COMMUNITY): Payer: Self-pay | Admitting: Internal Medicine

## 2022-12-26 DIAGNOSIS — K766 Portal hypertension: Secondary | ICD-10-CM

## 2022-12-26 DIAGNOSIS — K7581 Nonalcoholic steatohepatitis (NASH): Secondary | ICD-10-CM | POA: Diagnosis present

## 2022-12-26 DIAGNOSIS — Z8719 Personal history of other diseases of the digestive system: Secondary | ICD-10-CM

## 2022-12-26 DIAGNOSIS — K7460 Unspecified cirrhosis of liver: Secondary | ICD-10-CM | POA: Diagnosis not present

## 2022-12-26 DIAGNOSIS — J9601 Acute respiratory failure with hypoxia: Secondary | ICD-10-CM

## 2022-12-26 DIAGNOSIS — K7682 Hepatic encephalopathy: Secondary | ICD-10-CM | POA: Diagnosis present

## 2022-12-26 DIAGNOSIS — I272 Pulmonary hypertension, unspecified: Secondary | ICD-10-CM | POA: Diagnosis present

## 2022-12-26 DIAGNOSIS — E876 Hypokalemia: Secondary | ICD-10-CM | POA: Diagnosis present

## 2022-12-26 DIAGNOSIS — Z8659 Personal history of other mental and behavioral disorders: Secondary | ICD-10-CM

## 2022-12-26 DIAGNOSIS — K920 Hematemesis: Secondary | ICD-10-CM

## 2022-12-26 DIAGNOSIS — K7030 Alcoholic cirrhosis of liver without ascites: Secondary | ICD-10-CM | POA: Diagnosis present

## 2022-12-26 DIAGNOSIS — K3189 Other diseases of stomach and duodenum: Secondary | ICD-10-CM | POA: Diagnosis present

## 2022-12-26 DIAGNOSIS — I8510 Secondary esophageal varices without bleeding: Secondary | ICD-10-CM | POA: Diagnosis present

## 2022-12-26 DIAGNOSIS — N170 Acute kidney failure with tubular necrosis: Secondary | ICD-10-CM | POA: Diagnosis not present

## 2022-12-26 DIAGNOSIS — F1090 Alcohol use, unspecified, uncomplicated: Secondary | ICD-10-CM

## 2022-12-26 DIAGNOSIS — E872 Acidosis, unspecified: Secondary | ICD-10-CM | POA: Diagnosis present

## 2022-12-26 DIAGNOSIS — K767 Hepatorenal syndrome: Secondary | ICD-10-CM | POA: Diagnosis present

## 2022-12-26 DIAGNOSIS — I700 Atherosclerosis of aorta: Secondary | ICD-10-CM | POA: Diagnosis present

## 2022-12-26 DIAGNOSIS — D472 Monoclonal gammopathy: Secondary | ICD-10-CM | POA: Diagnosis present

## 2022-12-26 DIAGNOSIS — K922 Gastrointestinal hemorrhage, unspecified: Secondary | ICD-10-CM | POA: Diagnosis not present

## 2022-12-26 DIAGNOSIS — R6510 Systemic inflammatory response syndrome (SIRS) of non-infectious origin without acute organ dysfunction: Secondary | ICD-10-CM | POA: Diagnosis present

## 2022-12-26 DIAGNOSIS — F319 Bipolar disorder, unspecified: Secondary | ICD-10-CM | POA: Diagnosis present

## 2022-12-26 DIAGNOSIS — F411 Generalized anxiety disorder: Secondary | ICD-10-CM | POA: Diagnosis present

## 2022-12-26 DIAGNOSIS — N200 Calculus of kidney: Secondary | ICD-10-CM | POA: Diagnosis present

## 2022-12-26 DIAGNOSIS — E871 Hypo-osmolality and hyponatremia: Secondary | ICD-10-CM | POA: Diagnosis present

## 2022-12-26 DIAGNOSIS — K7290 Hepatic failure, unspecified without coma: Secondary | ICD-10-CM

## 2022-12-26 DIAGNOSIS — G9340 Encephalopathy, unspecified: Secondary | ICD-10-CM | POA: Diagnosis present

## 2022-12-26 DIAGNOSIS — R195 Other fecal abnormalities: Secondary | ICD-10-CM

## 2022-12-26 DIAGNOSIS — D509 Iron deficiency anemia, unspecified: Secondary | ICD-10-CM | POA: Diagnosis present

## 2022-12-26 DIAGNOSIS — R578 Other shock: Secondary | ICD-10-CM | POA: Diagnosis not present

## 2022-12-26 DIAGNOSIS — N179 Acute kidney failure, unspecified: Secondary | ICD-10-CM | POA: Diagnosis not present

## 2022-12-26 DIAGNOSIS — F1010 Alcohol abuse, uncomplicated: Secondary | ICD-10-CM | POA: Diagnosis present

## 2022-12-26 DIAGNOSIS — G9341 Metabolic encephalopathy: Secondary | ICD-10-CM | POA: Diagnosis not present

## 2022-12-26 DIAGNOSIS — K900 Celiac disease: Secondary | ICD-10-CM | POA: Diagnosis present

## 2022-12-26 DIAGNOSIS — G4733 Obstructive sleep apnea (adult) (pediatric): Secondary | ICD-10-CM | POA: Diagnosis present

## 2022-12-26 DIAGNOSIS — R579 Shock, unspecified: Secondary | ICD-10-CM | POA: Diagnosis not present

## 2022-12-26 LAB — POCT I-STAT 7, (LYTES, BLD GAS, ICA,H+H)
Acid-base deficit: 22 mmol/L — ABNORMAL HIGH (ref 0.0–2.0)
Acid-base deficit: 16 mmol/L — ABNORMAL HIGH (ref 0.0–2.0)
Bicarbonate: 7.3 mmol/L — ABNORMAL LOW (ref 20.0–28.0)
Bicarbonate: 9.5 mmol/L — ABNORMAL LOW (ref 20.0–28.0)
Calcium, Ion: 1.58 mmol/L (ref 1.15–1.40)
Calcium, Ion: 1.32 mmol/L (ref 1.15–1.40)
HCT: 37 % — ABNORMAL LOW (ref 39.0–52.0)
HCT: 35 % — ABNORMAL LOW (ref 39.0–52.0)
Hemoglobin: 12.6 g/dL — ABNORMAL LOW (ref 13.0–17.0)
Hemoglobin: 11.9 g/dL — ABNORMAL LOW (ref 13.0–17.0)
O2 Saturation: 98 %
O2 Saturation: 99 %
Patient temperature: 96.7
Potassium: 3 mmol/L — ABNORMAL LOW (ref 3.5–5.1)
Potassium: 2.6 mmol/L — CL (ref 3.5–5.1)
Sodium: 142 mmol/L (ref 135–145)
Sodium: 146 mmol/L — ABNORMAL HIGH (ref 135–145)
TCO2: 8 mmol/L — ABNORMAL LOW (ref 22–32)
TCO2: 10 mmol/L — ABNORMAL LOW (ref 22–32)
pCO2 arterial: 26.5 mmHg — ABNORMAL LOW (ref 32–48)
pCO2 arterial: 20.9 mmHg — ABNORMAL LOW (ref 32–48)
pH, Arterial: 7.046 — CL (ref 7.35–7.45)
pH, Arterial: 7.261 — ABNORMAL LOW (ref 7.35–7.45)
pO2, Arterial: 151 mmHg — ABNORMAL HIGH (ref 83–108)
pO2, Arterial: 151 mmHg — ABNORMAL HIGH (ref 83–108)

## 2022-12-26 LAB — CBC
HCT: 41.8 % (ref 39.0–52.0)
Hemoglobin: 14.5 g/dL (ref 13.0–17.0)
MCH: 32.2 pg (ref 26.0–34.0)
MCHC: 34.7 g/dL (ref 30.0–36.0)
MCV: 92.7 fL (ref 80.0–100.0)
Platelets: 189 10*3/uL (ref 150–400)
RBC: 4.51 MIL/uL (ref 4.22–5.81)
RDW: 14.6 % (ref 11.5–15.5)
WBC: 19.7 10*3/uL — ABNORMAL HIGH (ref 4.0–10.5)
nRBC: 0 % (ref 0.0–0.2)

## 2022-12-26 LAB — BASIC METABOLIC PANEL
BUN: 27 mg/dL — ABNORMAL HIGH (ref 6–20)
CO2: <7 mmol/L — ABNORMAL LOW (ref 22–32)
Calcium: 9 mg/dL (ref 8.9–10.3)
Chloride: 114 mmol/L — ABNORMAL HIGH (ref 98–111)
Creatinine, Ser: 2.41 mg/dL — ABNORMAL HIGH (ref 0.61–1.24)
GFR, Estimated: 30 mL/min — ABNORMAL LOW (ref >60)
Glucose, Bld: 185 mg/dL — ABNORMAL HIGH (ref 70–99)
Potassium: 2.3 mmol/L — CL (ref 3.5–5.1)
Sodium: 135 mmol/L (ref 135–145)

## 2022-12-26 LAB — I-STAT ARTERIAL BLOOD GAS, ED
Acid-base deficit: 26 mmol/L — ABNORMAL HIGH (ref 0.0–2.0)
Bicarbonate: 6.6 mmol/L — ABNORMAL LOW (ref 20.0–28.0)
Calcium, Ion: 1.5 mmol/L — ABNORMAL HIGH (ref 1.15–1.40)
HCT: 42 % (ref 39.0–52.0)
Hemoglobin: 14.3 g/dL (ref 13.0–17.0)
O2 Saturation: 100 %
Patient temperature: 97.6
Potassium: 2.3 mmol/L — CL (ref 3.5–5.1)
Sodium: 141 mmol/L (ref 135–145)
TCO2: 8 mmol/L — ABNORMAL LOW (ref 22–32)
pCO2 arterial: 34.9 mmHg (ref 32–48)
pH, Arterial: 6.882 — CL (ref 7.35–7.45)
pO2, Arterial: 391 mmHg — ABNORMAL HIGH (ref 83–108)

## 2022-12-26 LAB — APTT: aPTT: 35 seconds (ref 24–36)

## 2022-12-26 LAB — COMPREHENSIVE METABOLIC PANEL
ALT: 16 U/L (ref 0–44)
AST: 45 U/L — ABNORMAL HIGH (ref 15–41)
Albumin: 3.3 g/dL — ABNORMAL LOW (ref 3.5–5.0)
Alkaline Phosphatase: 188 U/L — ABNORMAL HIGH (ref 38–126)
BUN: 25 mg/dL — ABNORMAL HIGH (ref 6–20)
CO2: <7 mmol/L — ABNORMAL LOW (ref 22–32)
Calcium: 8.8 mg/dL — ABNORMAL LOW (ref 8.9–10.3)
Chloride: 117 mmol/L — ABNORMAL HIGH (ref 98–111)
Creatinine, Ser: 2.35 mg/dL — ABNORMAL HIGH (ref 0.61–1.24)
GFR, Estimated: 31 mL/min — ABNORMAL LOW (ref >60)
Glucose, Bld: 155 mg/dL — ABNORMAL HIGH (ref 70–99)
Potassium: 2.2 mmol/L — CL (ref 3.5–5.1)
Sodium: 132 mmol/L — ABNORMAL LOW (ref 135–145)
Total Bilirubin: 5 mg/dL — ABNORMAL HIGH (ref 0.3–1.2)
Total Protein: 7.3 g/dL (ref 6.5–8.1)

## 2022-12-26 LAB — URINALYSIS, ROUTINE W REFLEX MICROSCOPIC
Bilirubin Urine: NEGATIVE
Glucose, UA: NEGATIVE mg/dL
Ketones, ur: NEGATIVE mg/dL
Leukocytes,Ua: NEGATIVE
Nitrite: NEGATIVE
Protein, ur: 100 mg/dL — AB
Specific Gravity, Urine: 1.012 (ref 1.005–1.030)
pH: 5 (ref 5.0–8.0)

## 2022-12-26 LAB — POC OCCULT BLOOD, ED: Fecal Occult Bld: POSITIVE — AB

## 2022-12-26 LAB — RAPID URINE DRUG SCREEN, HOSP PERFORMED
Amphetamines: NOT DETECTED
Barbiturates: NOT DETECTED
Benzodiazepines: NOT DETECTED
Cocaine: NOT DETECTED
Opiates: NOT DETECTED
Tetrahydrocannabinol: NOT DETECTED

## 2022-12-26 LAB — MAGNESIUM: Magnesium: 1.7 mg/dL (ref 1.7–2.4)

## 2022-12-26 LAB — NA AND K (SODIUM & POTASSIUM), RAND UR
Potassium Urine: 15 mmol/L
Sodium, Ur: 11 mmol/L

## 2022-12-26 LAB — POCT I-STAT, CHEM 8
BUN: 31 mg/dL — ABNORMAL HIGH (ref 6–20)
Calcium, Ion: 1.36 mmol/L (ref 1.15–1.40)
Chloride: 122 mmol/L — ABNORMAL HIGH (ref 98–111)
Creatinine, Ser: 2.4 mg/dL — ABNORMAL HIGH (ref 0.61–1.24)
Glucose, Bld: 152 mg/dL — ABNORMAL HIGH (ref 70–99)
HCT: 43 % (ref 39.0–52.0)
Hemoglobin: 14.6 g/dL (ref 13.0–17.0)
Potassium: 2.3 mmol/L — CL (ref 3.5–5.1)
Sodium: 140 mmol/L (ref 135–145)
TCO2: 8 mmol/L — ABNORMAL LOW (ref 22–32)

## 2022-12-26 LAB — PROTIME-INR
INR: 2.1 — ABNORMAL HIGH (ref 0.8–1.2)
Prothrombin Time: 23.8 seconds — ABNORMAL HIGH (ref 11.4–15.2)

## 2022-12-26 LAB — AMYLASE: Amylase: 240 U/L — ABNORMAL HIGH (ref 28–100)

## 2022-12-26 LAB — HIV ANTIBODY (ROUTINE TESTING W REFLEX): HIV Screen 4th Generation wRfx: NONREACTIVE

## 2022-12-26 LAB — AMMONIA
Ammonia: 207 umol/L — ABNORMAL HIGH (ref 9–35)
Ammonia: 247 umol/L — ABNORMAL HIGH (ref 9–35)

## 2022-12-26 LAB — SAMPLE TO BLOOD BANK

## 2022-12-26 LAB — GLUCOSE, CAPILLARY
Glucose-Capillary: 181 mg/dL — ABNORMAL HIGH (ref 70–99)
Glucose-Capillary: 145 mg/dL — ABNORMAL HIGH (ref 70–99)
Glucose-Capillary: 135 mg/dL — ABNORMAL HIGH (ref 70–99)
Glucose-Capillary: 143 mg/dL — ABNORMAL HIGH (ref 70–99)
Glucose-Capillary: 159 mg/dL — ABNORMAL HIGH (ref 70–99)

## 2022-12-26 LAB — ETHANOL: Alcohol, Ethyl (B): <10 mg/dL (ref <10)

## 2022-12-26 LAB — TROPONIN I (HIGH SENSITIVITY): Troponin I (High Sensitivity): 42 ng/L — ABNORMAL HIGH (ref <18)

## 2022-12-26 LAB — LACTIC ACID, PLASMA
Lactic Acid, Venous: 2.3 mmol/L (ref 0.5–1.9)
Lactic Acid, Venous: 1.9 mmol/L (ref 0.5–1.9)

## 2022-12-26 LAB — MRSA NEXT GEN BY PCR, NASAL: MRSA by PCR Next Gen: NOT DETECTED

## 2022-12-26 LAB — POTASSIUM: Potassium: 2.2 mmol/L — CL (ref 3.5–5.1)

## 2022-12-26 LAB — CBG MONITORING, ED: Glucose-Capillary: 150 mg/dL — ABNORMAL HIGH (ref 70–99)

## 2022-12-26 LAB — PROCALCITONIN: Procalcitonin: 0.54 ng/mL

## 2022-12-26 LAB — PHOSPHORUS: Phosphorus: 3.8 mg/dL (ref 2.5–4.6)

## 2022-12-26 MED ORDER — LORAZEPAM 2 MG/ML IJ SOLN
1.0000 mg | INTRAMUSCULAR | Status: DC | PRN
Start: 2022-12-26 — End: 2022-12-26

## 2022-12-26 MED ORDER — POLYETHYLENE GLYCOL 3350 17 G PO PACK
17.0000 g | PACK | Freq: Every day | ORAL | Status: DC
  Administered 2022-12-27: 17 g
  Filled 2022-12-26: qty 1

## 2022-12-26 MED ORDER — PANTOPRAZOLE SODIUM 40 MG IV SOLR
40.0000 mg | Freq: Two times a day (BID) | INTRAVENOUS | Status: DC
  Administered 2022-12-26 – 2022-12-27 (×3): 40 mg via INTRAVENOUS
  Filled 2022-12-26 (×3): qty 10

## 2022-12-26 MED ORDER — LACTULOSE 10 GM/15ML PO SOLN
30.0000 g | Freq: Three times a day (TID) | ORAL | Status: DC
  Administered 2022-12-26 – 2022-12-27 (×4): 30 g
  Filled 2022-12-26 (×4): qty 45

## 2022-12-26 MED ORDER — ETOMIDATE 2 MG/ML IV SOLN
INTRAVENOUS | Status: DC | PRN
  Administered 2022-12-26: 20 mg via INTRAVENOUS

## 2022-12-26 MED ORDER — SPIRONOLACTONE 25 MG PO TABS: 50.0000 mg | ORAL_TABLET | Freq: Every day | ORAL | Status: DC

## 2022-12-26 MED ORDER — SODIUM BICARBONATE 8.4 % IV SOLN
100.0000 meq | Freq: Once | INTRAVENOUS | Status: AC
  Administered 2022-12-26: 100 meq via INTRAVENOUS
  Filled 2022-12-26: qty 100

## 2022-12-26 MED ORDER — DOCUSATE SODIUM 100 MG PO CAPS: 100.0000 mg | ORAL_CAPSULE | Freq: Two times a day (BID) | ORAL | Status: DC | PRN

## 2022-12-26 MED ORDER — FOLIC ACID 1 MG PO TABS: 1.0000 mg | ORAL_TABLET | Freq: Every day | ORAL | Status: DC

## 2022-12-26 MED ORDER — PANTOPRAZOLE SODIUM 40 MG IV SOLR: 40.0000 mg | Freq: Two times a day (BID) | INTRAVENOUS | Status: DC

## 2022-12-26 MED ORDER — POTASSIUM CHLORIDE 20 MEQ PO PACK
40.0000 meq | PACK | Freq: Three times a day (TID) | ORAL | Status: DC
  Administered 2022-12-26 (×2): 40 meq
  Filled 2022-12-26 (×3): qty 2

## 2022-12-26 MED ORDER — NOREPINEPHRINE 4 MG/250ML-% IV SOLN
2.0000 ug/min | INTRAVENOUS | Status: DC
  Administered 2022-12-26: 10 ug/min via INTRAVENOUS
  Administered 2022-12-26: 2 ug/min via INTRAVENOUS
  Administered 2022-12-27: 10 ug/min via INTRAVENOUS
  Filled 2022-12-26 (×3): qty 250

## 2022-12-26 MED ORDER — FENTANYL CITRATE PF 50 MCG/ML IJ SOSY
PREFILLED_SYRINGE | INTRAMUSCULAR | Status: AC
  Filled 2022-12-26: qty 2

## 2022-12-26 MED ORDER — FOLIC ACID 1 MG PO TABS
1.0000 mg | ORAL_TABLET | Freq: Every day | ORAL | Status: DC
  Administered 2022-12-26 – 2022-12-27 (×2): 1 mg
  Filled 2022-12-26 (×2): qty 1

## 2022-12-26 MED ORDER — POTASSIUM CHLORIDE 10 MEQ/100ML IV SOLN
10.0000 meq | INTRAVENOUS | Status: DC
  Filled 2022-12-26: qty 100

## 2022-12-26 MED ORDER — STERILE WATER FOR INJECTION IV SOLN
INTRAVENOUS | Status: DC
  Filled 2022-12-26 (×4): qty 1000

## 2022-12-26 MED ORDER — ADULT MULTIVITAMIN W/MINERALS CH
1.0000 | ORAL_TABLET | Freq: Every day | ORAL | Status: DC
Start: 2022-12-26 — End: 2022-12-26

## 2022-12-26 MED ORDER — SODIUM CHLORIDE 0.9 % IV SOLN: 250.0000 mL | INTRAVENOUS | Status: DC

## 2022-12-26 MED ORDER — SODIUM CHLORIDE 0.9 % IV SOLN
2.0000 g | INTRAVENOUS | Status: DC
  Administered 2022-12-26 – 2022-12-27 (×2): 2 g via INTRAVENOUS
  Filled 2022-12-26 (×2): qty 20

## 2022-12-26 MED ORDER — RIFAXIMIN 550 MG PO TABS
550.0000 mg | ORAL_TABLET | Freq: Two times a day (BID) | ORAL | Status: DC
  Administered 2022-12-26 – 2022-12-27 (×4): 550 mg
  Filled 2022-12-26 (×4): qty 1

## 2022-12-26 MED ORDER — FAMOTIDINE 20 MG PO TABS
20.0000 mg | ORAL_TABLET | Freq: Two times a day (BID) | ORAL | Status: DC
  Administered 2022-12-26: 20 mg
  Filled 2022-12-26: qty 1

## 2022-12-26 MED ORDER — KETAMINE HCL 50 MG/5ML IJ SOSY
PREFILLED_SYRINGE | INTRAMUSCULAR | Status: AC
  Filled 2022-12-26: qty 10

## 2022-12-26 MED ORDER — ROCURONIUM BROMIDE 50 MG/5ML IV SOLN
INTRAVENOUS | Status: DC | PRN
  Administered 2022-12-26: 100 mg via INTRAVENOUS

## 2022-12-26 MED ORDER — FENTANYL CITRATE PF 50 MCG/ML IJ SOSY
50.0000 ug | PREFILLED_SYRINGE | INTRAMUSCULAR | Status: DC | PRN
  Administered 2022-12-26 – 2022-12-27 (×8): 100 ug via INTRAVENOUS
  Filled 2022-12-26 (×2): qty 2
  Filled 2022-12-26: qty 1
  Filled 2022-12-26 (×6): qty 2

## 2022-12-26 MED ORDER — POTASSIUM CHLORIDE 20 MEQ PO PACK
40.0000 meq | PACK | Freq: Two times a day (BID) | ORAL | Status: DC
  Administered 2022-12-26: 40 meq
  Filled 2022-12-26: qty 2

## 2022-12-26 MED ORDER — THIAMINE MONONITRATE 100 MG PO TABS
100.0000 mg | ORAL_TABLET | Freq: Every day | ORAL | Status: DC
  Administered 2022-12-26 – 2022-12-27 (×2): 100 mg
  Filled 2022-12-26 (×4): qty 1

## 2022-12-26 MED ORDER — LORAZEPAM 1 MG PO TABS
1.0000 mg | ORAL_TABLET | ORAL | Status: DC | PRN
Start: 2022-12-26 — End: 2022-12-26

## 2022-12-26 MED ORDER — MIDAZOLAM HCL 2 MG/2ML IJ SOLN
INTRAMUSCULAR | Status: AC
  Filled 2022-12-26: qty 2

## 2022-12-26 MED ORDER — DOCUSATE SODIUM 50 MG/5ML PO LIQD: 100.0000 mg | Freq: Two times a day (BID) | ORAL | Status: DC | PRN

## 2022-12-26 MED ORDER — THIAMINE HCL 100 MG/ML IJ SOLN
100.0000 mg | Freq: Every day | INTRAMUSCULAR | Status: DC
Start: 2022-12-26 — End: 2022-12-26

## 2022-12-26 MED ORDER — LACTATED RINGERS IV SOLN: INTRAVENOUS | Status: DC

## 2022-12-26 MED ORDER — POLYETHYLENE GLYCOL 3350 17 G PO PACK: 17.0000 g | PACK | Freq: Every day | ORAL | Status: DC | PRN

## 2022-12-26 MED ORDER — SUCCINYLCHOLINE CHLORIDE 200 MG/10ML IV SOSY
PREFILLED_SYRINGE | INTRAVENOUS | Status: AC
  Filled 2022-12-26: qty 10

## 2022-12-26 MED ORDER — FENTANYL CITRATE PF 50 MCG/ML IJ SOSY
50.0000 ug | PREFILLED_SYRINGE | Freq: Once | INTRAMUSCULAR | Status: AC
  Administered 2022-12-26: 50 ug via INTRAVENOUS
  Filled 2022-12-26: qty 1

## 2022-12-26 MED ORDER — POTASSIUM CHLORIDE 10 MEQ/100ML IV SOLN
10.0000 meq | INTRAVENOUS | Status: AC
  Administered 2022-12-26 (×6): 10 meq via INTRAVENOUS
  Filled 2022-12-26 (×5): qty 100

## 2022-12-26 MED ORDER — ORAL CARE MOUTH RINSE: 15.0000 mL | OROMUCOSAL | Status: DC | PRN

## 2022-12-26 MED ORDER — FOLIC ACID 5 MG/ML IJ SOLN
1.0000 mg | Freq: Every day | INTRAMUSCULAR | Status: DC
  Filled 2022-12-26 (×2): qty 0.2

## 2022-12-26 MED ORDER — CALCIUM CHLORIDE 10 % IV SOLN
1.0000 g | Freq: Once | INTRAVENOUS | Status: AC
  Administered 2022-12-26: 1 g via INTRAVENOUS
  Filled 2022-12-26: qty 10

## 2022-12-26 MED ORDER — LORAZEPAM 2 MG/ML IJ SOLN
1.0000 mg | Freq: Once | INTRAMUSCULAR | Status: AC
  Administered 2022-12-26: 1 mg via INTRAVENOUS
  Filled 2022-12-26: qty 1

## 2022-12-26 MED ORDER — SODIUM CHLORIDE 0.9 % IV SOLN
50.0000 ug/h | INTRAVENOUS | Status: DC
  Administered 2022-12-26: 50 ug/h via INTRAVENOUS
  Filled 2022-12-26: qty 1

## 2022-12-26 MED ORDER — ETOMIDATE 2 MG/ML IV SOLN
INTRAVENOUS | Status: AC
  Filled 2022-12-26: qty 20

## 2022-12-26 MED ORDER — DEXMEDETOMIDINE HCL IN NACL 400 MCG/100ML IV SOLN
0.0000 ug/kg/h | INTRAVENOUS | Status: DC
  Administered 2022-12-26: 0.4 ug/kg/h via INTRAVENOUS
  Administered 2022-12-26 (×2): 1.2 ug/kg/h via INTRAVENOUS
  Administered 2022-12-27: 1 ug/kg/h via INTRAVENOUS
  Filled 2022-12-26: qty 300
  Filled 2022-12-26 (×3): qty 100

## 2022-12-26 MED ORDER — POTASSIUM CHLORIDE 10 MEQ/100ML IV SOLN
INTRAVENOUS | Status: AC
  Filled 2022-12-26: qty 100

## 2022-12-26 MED ORDER — CHLORHEXIDINE GLUCONATE CLOTH 2 % EX PADS
6.0000 | MEDICATED_PAD | Freq: Every day | CUTANEOUS | Status: DC
  Administered 2022-12-26 – 2022-12-27 (×4): 6 via TOPICAL

## 2022-12-26 MED ORDER — ORAL CARE MOUTH RINSE
15.0000 mL | OROMUCOSAL | Status: DC
  Administered 2022-12-26 – 2022-12-27 (×17): 15 mL via OROMUCOSAL

## 2022-12-26 MED ORDER — FENTANYL CITRATE PF 50 MCG/ML IJ SOSY: 25.0000 ug | PREFILLED_SYRINGE | Freq: Four times a day (QID) | INTRAMUSCULAR | Status: DC | PRN

## 2022-12-26 MED ORDER — LACTULOSE 10 GM/15ML PO SOLN: 30.0000 g | Freq: Three times a day (TID) | ORAL | Status: DC

## 2022-12-26 MED ORDER — THIAMINE MONONITRATE 100 MG PO TABS
100.0000 mg | ORAL_TABLET | Freq: Every day | ORAL | Status: DC
Start: 2022-12-26 — End: 2022-12-26

## 2022-12-26 MED ORDER — FENTANYL CITRATE PF 50 MCG/ML IJ SOSY
50.0000 ug | PREFILLED_SYRINGE | INTRAMUSCULAR | Status: AC | PRN
  Administered 2022-12-26 – 2022-12-27 (×3): 50 ug via INTRAVENOUS
  Filled 2022-12-26 (×4): qty 1

## 2022-12-26 MED ORDER — FUROSEMIDE 10 MG/ML IJ SOLN: 20.0000 mg | Freq: Once | INTRAMUSCULAR | Status: DC

## 2022-12-26 MED ORDER — PROPRANOLOL HCL 20 MG PO TABS
20.0000 mg | ORAL_TABLET | Freq: Two times a day (BID) | ORAL | Status: DC
  Filled 2022-12-26: qty 1

## 2022-12-26 MED ORDER — THIAMINE HCL 100 MG PO TABS
100.0000 mg | ORAL_TABLET | Freq: Every day | ORAL | Status: DC
  Filled 2022-12-26: qty 1

## 2022-12-26 MED ORDER — PROPRANOLOL HCL 20 MG PO TABS
20.0000 mg | ORAL_TABLET | Freq: Two times a day (BID) | ORAL | Status: DC
  Administered 2022-12-26: 20 mg
  Filled 2022-12-26: qty 1

## 2022-12-26 MED ORDER — DOCUSATE SODIUM 50 MG/5ML PO LIQD
100.0000 mg | Freq: Two times a day (BID) | ORAL | Status: DC
  Administered 2022-12-27: 100 mg
  Filled 2022-12-26: qty 10

## 2022-12-26 MED ORDER — ROCURONIUM BROMIDE 10 MG/ML (PF) SYRINGE
PREFILLED_SYRINGE | INTRAVENOUS | Status: AC
  Filled 2022-12-26: qty 10

## 2022-12-26 MED ORDER — LACTULOSE 10 GM/15ML PO SOLN
30.0000 g | Freq: Three times a day (TID) | ORAL | Status: DC
  Administered 2022-12-26: 30 g
  Filled 2022-12-26: qty 45

## 2022-12-26 NOTE — ED Notes (Signed)
Assumed care of patient.

## 2022-12-26 NOTE — ED Notes (Signed)
CCM at bedside 

## 2022-12-26 NOTE — ED Notes (Addendum)
Dr. Merrily Pew and Dr. Jomarie Longs at bedside

## 2022-12-26 NOTE — ED Provider Notes (Signed)
Spoke with Dr. Janalyn Shy x 2 regarding admission of the patient    Pellegrino Kennard, MD 12/26/22 [phone]

## 2022-12-26 NOTE — ED Notes (Signed)
This RN went to check on patient and noticed pt was barely responsive to pain. Pt could not open eyes. RN immediately messaged doctor to lay eyes on patient. Doctor stated pt was lethargic due to 100 mcgs of fentanyl given at midnight. This RN requested CCM see pt due to pt declining. RN verbalized that pt is not appropriate for telemetry or progressive floor.

## 2022-12-26 NOTE — ED Notes (Signed)
ED TO INPATIENT HANDOFF REPORT  ED Nurse Name and Phone #:   S Name/Age/Gender Benjamin Knight 57 y.o. male Room/Bed: TRACC/TRACC  Code Status   Code Status: Full Code  Home/SNF/Other Home  Is this baseline? No   Triage Complete: Triage complete  Chief Complaint Altered mental status, unspecified [R41.82] Acute metabolic encephalopathy [G93.41] Acute encephalopathy [G93.40] AMS (altered mental status) [R41.82]  Triage Note Pt has Hx of liver cirrhosis, with Hx of hepatic encephalopathy, pt has had a progression of the symptoms over the past week. Presenting with moaning, tachypnea, inability to answer questions effectively. Pt has slurred speech and poor motor skills with memory loss as well.    Allergies Allergies  Allergen Reactions   Sertraline Other (See Comments)    Tremors, "felt weird"    Level of Care/Admitting Diagnosis ED Disposition     ED Disposition  Admit   Condition  --   Comment  Hospital Area: [facility] [[id number]]  Level of Care: ICU [6]  May admit patient to [facility] or [facility] if equivalent level of care is available:: No  Covid Evaluation: Asymptomatic - no recent exposure (last 10 days) testing not required  Diagnosis: AMS (altered mental status) [1308657]  Admitting Physician: Cheri Fowler [[id number]]  Attending Physician: Cheri Fowler [[id number]]  Certification:: I certify this patient will need inpatient services for at least 2 midnights          B Medical/Surgery History Past Medical History:  Diagnosis Date   Alcoholic cirrhosis (HCC)    Bipolar disorder (HCC)    Chronic back pain    Cirrhosis (HCC)    Depression    Esophageal varices (HCC)    Portal hypertensive gastropathy (HCC)    Past Surgical History:  Procedure Laterality Date   APPENDECTOMY       A IV Location/Drains/Wounds Patient Lines/Drains/Airways Status     Active Line/Drains/Airways     Name Placement date Placement time Site  Days   Peripheral IV 12/25/22 18 G Anterior;Distal;Right;Upper Arm 12/25/22  2144  Arm  1   Peripheral IV 12/26/22 20 G Anterior;Left Forearm 12/26/22  0127  Forearm  less than 1   Peripheral IV 12/26/22 18 G Left Forearm 12/26/22  0817  Forearm  less than 1   NG/OG Vented/Dual Lumen 14 Fr. Left nare 12/26/22  0127  Left nare  less than 1   Urethral Catheter Natosha, RN Coude 16 Fr. 12/26/22  0618  Coude  less than 1   Airway 7.5 mm 12/26/22  0750  -- less than 1            Intake/Output Last 24 hours  Intake/Output Summary (Last 24 hours) at 12/26/2022 4401 Last data filed at 12/26/2022 0272 Gross per 24 hour  Intake 1508.48 ml  Output 500 ml  Net 1008.48 ml    Labs/Imaging Results for orders placed or performed during the hospital encounter of 12/25/22 (from the past 48 hour(s))  Comprehensive metabolic panel     Status: Abnormal   Collection Time: 12/25/22  9:14 PM  Result Value Ref Range   Sodium 134 (L) 135 - 145 mmol/L   Potassium 2.3 (LL) 3.5 - 5.1 mmol/L    Comment: CRITICAL RESULT CALLED TO, READ BACK BY AND VERIFIED WITH A. BRADSHAW RN 12/25/22 @2203  BY J. WHITE   Chloride 114 (H) 98 - 111 mmol/L   CO2 <7 (L) 22 - 32 mmol/L   Glucose, Bld 204 (H) 70 - 99 mg/dL  Comment: Glucose reference range applies only to samples taken after fasting for at least 8 hours.   BUN 22 (H) 6 - 20 mg/dL   Creatinine, Ser 1.61 (H) 0.61 - 1.24 mg/dL   Calcium 9.4 8.9 - 09.6 mg/dL   Total Protein 8.4 (H) 6.5 - 8.1 g/dL   Albumin 3.9 3.5 - 5.0 g/dL   AST 43 (H) 15 - 41 U/L   ALT 19 0 - 44 U/L   Alkaline Phosphatase 211 (H) 38 - 126 U/L   Total Bilirubin 5.7 (H) 0.3 - 1.2 mg/dL   GFR, Estimated 29 (L) >60 mL/min    Comment: (NOTE) Calculated using the CKD-EPI Creatinine Equation (2021)    Anion gap NOT CALCULATED 5 - 15    Comment: ELECTROLYTES REPEATED TO VERIFY Performed at [facility], [address], [place], [place] [address]   CBC     Status: Abnormal   Collection  Time: 12/25/22  9:14 PM  Result Value Ref Range   WBC 18.7 (H) 4.0 - 10.5 K/uL   RBC 4.95 4.22 - 5.81 MIL/uL   Hemoglobin 16.0 13.0 - 17.0 g/dL   HCT 98.1 19.1 - 47.8 %   MCV 91.5 80.0 - 100.0 fL   MCH 32.3 26.0 - 34.0 pg   MCHC 35.3 30.0 - 36.0 g/dL   RDW 29.5 62.1 - 30.8 %   Platelets 266 150 - 400 K/uL   nRBC 0.0 0.0 - 0.2 %    Comment: Performed at [facility], [address], [place], [place] [address]  Ammonia     Status: Abnormal   Collection Time: 12/25/22  9:14 PM  Result Value Ref Range   Ammonia 308 (H) 9 - 35 umol/L    Comment: Performed at [facility], [address] [place], [place] [address]  CBG monitoring, ED     Status: Abnormal   Collection Time: 12/25/22  9:41 PM  Result Value Ref Range   Glucose-Capillary 167 (H) 70 - 99 mg/dL    Comment: Glucose reference range applies only to samples taken after fasting for at least 8 hours.  I-stat chem 8, ED (not at [facility], [facility] or [facility])     Status: Abnormal   Collection Time: 12/25/22 10:15 PM  Result Value Ref Range   Sodium 137 135 - 145 mmol/L   Potassium 3.7 3.5 - 5.1 mmol/L   Chloride 120 (H) 98 - 111 mmol/L   BUN 37 (H) 6 - 20 mg/dL   Creatinine, Ser 5.28 (H) 0.61 - 1.24 mg/dL   Glucose, Bld 413 (H) 70 - 99 mg/dL    Comment: Glucose reference range applies only to samples taken after fasting for at least 8 hours.   Calcium, Ion 1.37 1.15 - 1.40 mmol/L   TCO2 7 (L) 22 - 32 mmol/L   Hemoglobin 16.0 13.0 - 17.0 g/dL   HCT 24.4 01.0 - 27.2 %  POC occult blood, ED     Status: Abnormal   Collection Time: 12/26/22 12:03 AM  Result Value Ref Range   Fecal Occult Bld POSITIVE (A) NEGATIVE  Urinalysis, Routine w reflex microscopic -Urine, Clean Catch     Status: Abnormal   Collection Time: 12/26/22  6:30 AM  Result Value Ref Range   Color, Urine AMBER (A) YELLOW    Comment: BIOCHEMICALS MAY BE AFFECTED BY COLOR   APPearance CLEAR CLEAR   Specific Gravity, Urine 1.012 1.005 - 1.030   pH 5.0 5.0 -  8.0    Glucose, UA NEGATIVE NEGATIVE mg/dL   Hgb urine dipstick LARGE (A) NEGATIVE   Bilirubin Urine NEGATIVE NEGATIVE   Ketones, ur NEGATIVE NEGATIVE mg/dL   Protein, ur 161 (A) NEGATIVE mg/dL   Nitrite NEGATIVE NEGATIVE   Leukocytes,Ua NEGATIVE NEGATIVE   RBC / HPF 0-5 0 - 5 RBC/hpf   WBC, UA 11-20 0 - 5 WBC/hpf   Bacteria, UA RARE (A) NONE SEEN   Squamous Epithelial / HPF 0-5 0 - 5 /HPF   Mucus PRESENT    Hyaline Casts, UA PRESENT     Comment: Performed at [facility], [address] [place], [place] [address]  Ammonia     Status: Abnormal   Collection Time: 12/26/22  6:30 AM  Result Value Ref Range   Ammonia 207 (H) 9 - 35 umol/L    Comment: Performed at [facility], [address] [place], [place] [address]  Comprehensive metabolic panel     Status: Abnormal   Collection Time: 12/26/22  6:38 AM  Result Value Ref Range   Sodium 132 (L) 135 - 145 mmol/L   Potassium 2.2 (LL) 3.5 - 5.1 mmol/L    Comment: CRITICAL RESULT CALLED TO, READ BACK BY AND VERIFIED WITH J.NEWTON,RN 0805 12/26/22 CLARK,S   Chloride 117 (H) 98 - 111 mmol/L   CO2 <7 (L) 22 - 32 mmol/L   Glucose, Bld 155 (H) 70 - 99 mg/dL    Comment: Glucose reference range applies only to samples taken after fasting for at least 8 hours.   BUN 25 (H) 6 - 20 mg/dL   Creatinine, Ser 1.19 (H) 0.61 - 1.24 mg/dL   Calcium 8.8 (L) 8.9 - 10.3 mg/dL   Total Protein 7.3 6.5 - 8.1 g/dL   Albumin 3.3 (L) 3.5 - 5.0 g/dL   AST 45 (H) 15 - 41 U/L   ALT 16 0 - 44 U/L   Alkaline Phosphatase 188 (H) 38 - 126 U/L   Total Bilirubin 5.0 (H) 0.3 - 1.2 mg/dL   GFR, Estimated 31 (L) >60 mL/min    Comment: (NOTE) Calculated using the CKD-EPI Creatinine Equation (2021)    Anion gap NOT CALCULATED 5 - 15    Comment: ELECTROLYTES REPEATED TO VERIFY Performed at [facility], [address], [place], [place] [address]   Ethanol     Status: None   Collection Time: 12/26/22  6:38 AM  Result Value Ref Range   Alcohol, Ethyl (B)  <10 <10 mg/dL    Comment: (NOTE) Lowest detectable limit for serum alcohol is 10 mg/dL.  For medical purposes only. Performed at [facility], [address] [place], [place] [address]   Sample to Blood Bank     Status: None   Collection Time: 12/26/22  6:48 AM  Result Value Ref Range   Blood Bank Specimen SAMPLE AVAILABLE FOR TESTING    Sample Expiration      12/29/2022,2359 Performed at [facility], [address], [place], [place] [address]   CBG monitoring, ED     Status: Abnormal   Collection Time: 12/26/22  7:36 AM  Result Value Ref Range   Glucose-Capillary 150 (H) 70 - 99 mg/dL    Comment: Glucose reference range applies only to samples taken after fasting for at least 8 hours.   DG CHEST PORT 1 VIEW  Result Date: 12/26/2022 CLINICAL DATA:  Intubated EXAM: PORTABLE CHEST 1 VIEW COMPARISON:  12/25/2022 FINDINGS: Endotracheal  tube tip 3 cm above the carina. Orogastric or nasogastric tube enters the stomach. Patchy density persists at the lung bases, left more than right, possibly slightly worsened. This could be due to atelectasis or bronchopneumonia. IMPRESSION: 1. Endotracheal tube tip 3 cm above the carina. 2. Patchy density at the lung bases, left more than right, possibly slightly worsened. Electronically Signed   By: Paulina Fusi M.D.   On: 12/26/2022 08:26   DG Abd Portable 1 View  Result Date: 12/26/2022 CLINICAL DATA:  Nasogastric tube placement. EXAM: PORTABLE ABDOMEN - 1 VIEW COMPARISON:  None Available. FINDINGS: A nasogastric tube is seen with its distal tip noted within the body of the stomach. The distal side hole sits approximally 4.4 cm distal to the expected region of the gastroesophageal junction. The bowel gas pattern is normal. No radio-opaque calculi or other significant radiographic abnormality are seen. IMPRESSION: Nasogastric tube positioning, as described above. Electronically Signed   By: Aram Candela M.D.   On: 12/26/2022 02:18   CT  CHEST ABDOMEN PELVIS WO CONTRAST  Result Date: 12/25/2022 CLINICAL DATA:  Abdominal pain. History of cirrhosis and hepatic encephalopathy. EXAM: CT CHEST, ABDOMEN AND PELVIS WITHOUT CONTRAST TECHNIQUE: Multidetector CT imaging of the chest, abdomen and pelvis was performed following the standard protocol without IV contrast. RADIATION DOSE REDUCTION: This exam was performed according to the departmental dose-optimization program which includes automated exposure control, adjustment of the mA and/or kV according to patient size and/or use of iterative reconstruction technique. COMPARISON:  None Available. FINDINGS: CT CHEST FINDINGS Cardiovascular: Borderline cardiomegaly. No pericardial effusion. Occasional coronary artery calcifications. The thoracic aorta is normal in caliber. Motion artifact through the chest limits assessment. Mediastinum/Nodes: No gross mediastinal adenopathy, allowing for motion artifact limitations. No esophageal wall thickening, the upper esophagus is patulous. Lungs/Pleura: Breathing motion artifact limits parenchymal assessment, particularly in the lower lobes. Allowing for this no evidence of focal airspace disease or pleural effusion. More detailed assessment is limited. Musculoskeletal: Thoracic spondylosis with anterior spurring. There are no acute or suspicious osseous abnormalities. Bilateral gynecomastia. CT ABDOMEN PELVIS FINDINGS Hepatobiliary: Nodular hepatic contours typical of cirrhosis. Motion artifact through the liver limits detailed assessment. Fluid density structure in the central dome measures 2.7 cm, likely cyst. Gallbladder is tentatively visualized and normal, although motion obscured. Pancreas: No gross pancreatic inflammation. Spleen: Upper normal in size, 12.3 cm greatest AP dimension. Adrenals/Urinary Tract: No adrenal nodule. No hydronephrosis. Punctate nonobstructing stone in the lower pole of the right kidney. Urinary bladder is only minimally distended. Wall  thickening is greater than expected for degree of distension. Stomach/Bowel: The stomach is distended with ingested material. Small bowel is decompressed. Fluid throughout the entire colon. No definite associated colonic wall thickening or inflammation, allowing for motion artifact limitations. The appendix is not confidently visualized on the current exam. Vascular/Lymphatic: Aortic and branch atherosclerosis. No aneurysm. Borderline retroperitoneal left periaortic node measuring 13 mm series 3, image 86. Reproductive: Prostate is unremarkable. Other: No significant ascites.  No free intra-abdominal air. Musculoskeletal: Lumbar spondylosis with anterior spurring. There are no acute or suspicious osseous abnormalities. IMPRESSION: 1. Fluid throughout the entire colon, can be seen with diarrheal illness. No definite associated colonic wall thickening or inflammation, allowing for motion artifact limitations. 2. Hepatic cirrhosis. 3. Punctate nonobstructing right renal stone. 4. No acute findings in the thorax allowing for motion artifact limitations. 5. Coronary artery calcifications and aortic atherosclerosis. Aortic Atherosclerosis (ICD10-I70.0). Electronically Signed   By: Narda Rutherford M.D.   On: 12/25/2022 23:17  CT Head Wo Contrast  Result Date: 12/25/2022 CLINICAL DATA:  Altered mental status. EXAM: CT HEAD WITHOUT CONTRAST TECHNIQUE: Contiguous axial images were obtained from the base of the skull through the vertex without intravenous contrast. RADIATION DOSE REDUCTION: This exam was performed according to the departmental dose-optimization program which includes automated exposure control, adjustment of the mA and/or kV according to patient size and/or use of iterative reconstruction technique. COMPARISON:  Head CT dated 03/30/2021. FINDINGS: Evaluation of this exam is limited due to motion artifact. Brain: The ventricles and sulci are appropriate size for the patient's age. The gray-white matter  discrimination is preserved. There is no acute intracranial hemorrhage. No mass effect or midline shift. No extra-axial fluid collection. Vascular: No hyperdense vessel or unexpected calcification. Skull: Normal. Negative for fracture or focal lesion. Sinuses/Orbits: No acute finding. Other: None IMPRESSION: No acute intracranial pathology. Electronically Signed   By: Elgie Collard M.D.   On: 12/25/2022 23:13   DG Chest Portable 1 View  Result Date: 12/25/2022 CLINICAL DATA:  Worsening hepatic encephalopathy. EXAM: PORTABLE CHEST 1 VIEW COMPARISON:  March 30, 2021 FINDINGS: The heart size and mediastinal contours are within normal limits. Low lung volumes are seen with subsequent crowding of the bronchovascular lung markings. There is no evidence of an acute infiltrate, pleural effusion or pneumothorax. The visualized skeletal structures are unremarkable. IMPRESSION: Low lung volumes without acute or active cardiopulmonary disease. Electronically Signed   By: Aram Candela M.D.   On: 12/25/2022 22:53    Pending Labs Unresulted Labs (From admission, onward)     Start     Ordered   12/27/22 0500  Protime-INR  Tomorrow morning,   R        12/26/22 0555   12/27/22 0500  APTT  Tomorrow morning,   R        12/26/22 0555   12/27/22 0500  CBC  Tomorrow morning,   R        12/26/22 0813   12/27/22 0500  Basic metabolic panel  Tomorrow morning,   R        12/26/22 0813   12/26/22 0829  Phosphorus  Once,   R        12/26/22 0830   12/26/22 0829  Potassium  Once,   R        12/26/22 0830   12/26/22 0814  Basic metabolic panel  ONCE - STAT,   STAT        12/26/22 0814   12/26/22 0812  Amylase  Once,   R        12/26/22 0813   12/26/22 0812  Lactic acid, plasma  STAT Now then every 3 hours,   R (with STAT occurrences)      12/26/22 0813   12/26/22 0812  Procalcitonin  Once,   R       References:    Procalcitonin Lower Respiratory Tract Infection AND Sepsis Procalcitonin Algorithm    12/26/22 0813   12/26/22 0812  Protime-INR  Once,   R        12/26/22 0813   12/26/22 0812  APTT  Once,   R        12/26/22 0813   12/26/22 0812  Culture, blood (Routine X 2) w Reflex to ID Panel  BLOOD CULTURE X 2,   R (with TIMED occurrences)      12/26/22 0813   12/26/22 0809  Blood gas, arterial  ONCE - STAT,   STAT  12/26/22 0808   12/26/22 0800  CBC  Once,   STAT        12/26/22 0800   12/26/22 0725  Ammonia  Once,   R        12/26/22 0724   12/26/22 0559  Na and K (sodium & potassium), rand urine  Once,   R        12/26/22 0558   12/26/22 0557  Urinalysis, Routine w reflex microscopic -Urine, Catheterized  Once,   R       Question:  Specimen Source  Answer:  Urine, Catheterized   12/26/22 0556   12/26/22 0546  HIV Antibody (routine testing w rflx)  (HIV Antibody (Routine testing w reflex) panel)  Once,   R        12/26/22 0555   12/26/22 0545  Culture, blood (Routine X 2) w Reflex to ID Panel  BLOOD CULTURE X 2,   R (with TIMED occurrences)      12/26/22 0555   12/25/22 2208  Magnesium  Add-on,   AD        12/25/22 2207   12/25/22 2141  Rapid urine drug screen (hospital performed)  ONCE - STAT,   STAT        12/25/22 2141            Vitals/Pain Today's Vitals   12/26/22 0754 12/26/22 0757 12/26/22 0800 12/26/22 0815  BP: (!) 111/59 120/61 129/67 (!) 140/75  Pulse: (!) 119 (!) 119 (!) 121 (!) 123  Resp: 19 (!) 22 (!) 22 (!) 22  Temp:      TempSrc:      SpO2: 94% 93% 93% 93%  Weight:      Height:      PainSc:        Isolation Precautions No active isolations  Medications Medications  lactulose (CHRONULAC) enema 200 gm (0 mLs Rectal Hold 12/26/22 0210)  pantoprozole (PROTONIX) 80 mg /NS 100 mL infusion (8 mg/hr Intravenous New Bag/Given 12/26/22 0825)  cefTRIAXone (ROCEPHIN) 2 g in sodium chloride 0.9 % 100 mL IVPB (0 g Intravenous Stopped 12/26/22 0739)  lactulose (CHRONULAC) 10 GM/15ML solution 30 g (has no administration in time range)  thiamine  (VITAMIN B1) tablet 100 mg (has no administration in time range)  folic acid injection 1 mg (has no administration in time range)  propranolol (INDERAL) tablet 20 mg (has no administration in time range)  lactated ringers infusion ( Intravenous New Bag/Given 12/26/22 0657)  etomidate (AMIDATE) injection (20 mg Intravenous Given 12/26/22 0746)  rocuronium bromide 100 MG/10ML SOSY (has no administration in time range)  etomidate (AMIDATE) 2 MG/ML injection (has no administration in time range)  rocuronium (ZEMURON) injection (100 mg Intravenous Given 12/26/22 0747)  dexmedetomidine (PRECEDEX) 400 MCG/100ML (4 mcg/mL) infusion (0.4 mcg/kg/hr  94 kg Intravenous New Bag/Given 12/26/22 0755)  potassium chloride 10 mEq in 100 mL IVPB (has no administration in time range)  polyethylene glycol (MIRALAX / GLYCOLAX) packet 17 g (has no administration in time range)  famotidine (PEPCID) tablet 20 mg (has no administration in time range)  docusate (COLACE) 50 MG/5ML liquid 100 mg (has no administration in time range)  fentaNYL (SUBLIMAZE) injection 50 mcg (50 mcg Intravenous Given 12/25/22 2144)  sodium chloride 0.9 % bolus 1,000 mL (0 mLs Intravenous Stopped 12/26/22 0032)  potassium chloride 10 mEq in 100 mL IVPB (0 mEq Intravenous Stopped 12/26/22 0600)  pantoprazole (PROTONIX) 80 mg /NS 100 mL IVPB (0 mg Intravenous Stopped 12/26/22 0156)  cefTRIAXone (ROCEPHIN) 1 g in sodium chloride 0.9 % 100 mL IVPB (0 g Intravenous Stopped 12/26/22 0044)  fentaNYL (SUBLIMAZE) injection 50 mcg (50 mcg Intravenous Given 12/26/22 0055)  LORazepam (ATIVAN) injection 1 mg (1 mg Intravenous Given 12/26/22 0100)    Mobility non-ambulatory     Focused Assessments    R Recommendations: See Admitting Provider Note  Report given to:   Additional Notes:

## 2022-12-26 NOTE — Progress Notes (Signed)
NAME:  Benjamin Knight, MRN:  [id number], DOB:  1966-04-11, LOS: 0 ADMISSION DATE:  12/25/2022, CONSULTATION DATE:  12/26/22 REFERRING MD:  EDP, CHIEF COMPLAINT:  hepatic encephalopathy   History of Present Illness:  57 yo male with pmh noted below presented this evening after 2-3 days of worsening mentation. Wife is at bedside and provides most the history. Pt was in his normal state of health until about 2-3 days ago when he began having some "off" behaviors. He would forget things, get agitated for example. She inquired about his medications this weekend and he stated he had not taken his lactulose for >3weeks now as he felt he did not need it any longer. She was able to convince him to take a dose Sunday and Monday but only one. Today when he got up from a nap he could barely stand and walk. He was agitated but disoriented. She ran to get gas in her car and then returned within 10 mins to find broken items and him vomiting with some blood in it. She was able to get him into the car but he remained very confused.   When pt arrived he was noted to be somnolent but arousable. He is slightly tachypneic likely 2/2 metabolic acidosis. He was intolerant taking oral and rectal lactulose ordered. I recommended bicarb, ng placement, and lactulose per ng.   Pt is hemodynamically stable at this time, protecting airway and no acute indication for ICU admission. I have asked [facility] to admit pt and we will be happy to assist should there be any change in his exam.    Pertinent  Medical History  Cirrhosis 2/2 etoh abuse (no etoh for 2 years) Portal htn Monoclonal gammopathy Iron def anemia Celiac disease osa  Significant Hospital Events: Including procedures, antibiotic start and stop dates in addition to other pertinent events   Presented to hospital 6/25, admitted 6/26  Interim History / Subjective:  Decompensated during the night required intubation  Objective   Blood pressure 129/67, pulse (!)  121, temperature 97.8 F (36.6 C), temperature source Oral, resp. rate (!) 22, height 5\' 8"  (1.727 m), weight 94 kg, SpO2 93 %.    Vent Mode: PRVC FiO2 (%):  [100 %] 100 % Set Rate:  [22 bmp] 22 bmp Vt Set:  [540 mL] 540 mL PEEP:  [5 cmH20] 5 cmH20 Plateau Pressure:  [17 cmH20] 17 cmH20   Intake/Output Summary (Last 24 hours) at 12/26/2022 8413 Last data filed at 12/26/2022 2440 Gross per 24 hour  Intake 1508.48 ml  Output 500 ml  Net 1008.48 ml   Filed Weights   12/25/22 2330  Weight: 94 kg    Examination: General: arousable, oriented to self and place only, appears chronically ill laying supine in bed HENT: ncat, eomi, perrla, mm dry and bloody dried Lungs: ctab Cardiovascular: rrr Abdomen: protuberant, mildly distended, bs diminished, mild guarding but no rebound Extremities: no c/c/e Neuro: no focal deficits, oriented only to self and place GU: deferred  Resolved Hospital Problem list     Assessment & Plan:  Acute resp failure 646 2024 0 7:45 AM decompensated during the night requiring urgent intubation by pulmonary critical care. Transferred to intensive care unit No sedation ordered.  60 encephalopathic Or sedation should be needed in the near future Continue to treat underlying problem as listed below   Hepatic encephalopathy: Cirrhosis Hematemesis Continue lactulose Avoid sedating medication Monitor coag studies May need GI consult  Hypokalemia Recent Labs  Lab 12/25/22 2114  12/25/22 2215 12/26/22 0638  K 2.3* 3.7 2.2*   Recheck potassium Monitor magnesium and phosphorus Replete as needed  Metabolic acidosis Bicarb drip Monitor pH        Best Practice (right click and "Reselect all SmartList Selections" daily)   Diet/type: NPO DVT prophylaxis: other high risk of bleed GI prophylaxis: PPI Lines: N/A Foley:  N/A Code Status:  full code Last date of multidisciplinary goals of care discussion [with wife at bedside. 6/26]  Labs    CBC: Recent Labs  Lab 12/25/22 2114 12/25/22 2215  WBC 18.7*  --   HGB 16.0 16.0  HCT 45.3 47.0  MCV 91.5  --   PLT 266  --     Basic Metabolic Panel: Recent Labs  Lab 12/25/22 2114 12/25/22 2215 12/26/22 0638  NA 134* 137 132*  K 2.3* 3.7 2.2*  CL 114* 120* 117*  CO2 <7*  --  <7*  GLUCOSE 204* 194* 155*  BUN 22* 37* 25*  CREATININE 2.51* 2.50* 2.35*  CALCIUM 9.4  --  8.8*   GFR: Estimated Creatinine Clearance: 38.1 mL/min (A) (by C-G formula based on SCr of 2.35 mg/dL (H)). Recent Labs  Lab 12/25/22 2114  WBC 18.7*    Liver Function Tests: Recent Labs  Lab 12/25/22 2114 12/26/22 0638  AST 43* 45*  ALT 19 16  ALKPHOS 211* 188*  BILITOT 5.7* 5.0*  PROT 8.4* 7.3  ALBUMIN 3.9 3.3*   No results for input(s): "LIPASE", "AMYLASE" in the last 168 hours. Recent Labs  Lab 12/25/22 2114 12/26/22 0630  AMMONIA 308* 207*    ABG    Component Value Date/Time   TCO2 7 (L) 12/25/2022 2215     Coagulation Profile: No results for input(s): "INR", "PROTIME" in the last 168 hours.  Cardiac Enzymes: No results for input(s): "CKTOTAL", "CKMB", "CKMBINDEX", "TROPONINI" in the last 168 hours.  HbA1C: No results found for: "HGBA1C"  CBG: Recent Labs  Lab 12/25/22 2141 12/26/22 0736  GLUCAP 167* 150*    Critical care time: 45 min excluding procedures.    Brett Canales Aaleigha Bozza ACNP Acute Care Nurse Practitioner [facility] Please consult Amion 12/26/2022, 8:17 AM

## 2022-12-26 NOTE — Progress Notes (Signed)
Pt transported from ED Trauma C to 2M06 with no complications noted.

## 2022-12-26 NOTE — Consult Note (Signed)
NAME:  Benjamin Knight, MRN:  [id number], DOB:  11-09-1965, LOS: 0 ADMISSION DATE:  12/25/2022, CONSULTATION DATE:  12/26/22 REFERRING MD:  EDP, CHIEF COMPLAINT:  hepatic encephalopathy   History of Present Illness:  57 yo male with pmh noted below presented this evening after 2-3 days of worsening mentation. Wife is at bedside and provides most the history. Pt was in his normal state of health until about 2-3 days ago when he began having some "off" behaviors. He would forget things, get agitated for example. She inquired about his medications this weekend and he stated he had not taken his lactulose for >3weeks now as he felt he did not need it any longer. She was able to convince him to take a dose Sunday and Monday but only one. Today when he got up from a nap he could barely stand and walk. He was agitated but disoriented. She ran to get gas in her car and then returned within 10 mins to find broken items and him vomiting with some blood in it. She was able to get him into the car but he remained very confused.   When pt arrived he was noted to be somnolent but arousable. He is slightly tachypneic likely 2/2 metabolic acidosis. He was intolerant taking oral and rectal lactulose ordered. I recommended bicarb, ng placement, and lactulose per ng.   Pt is hemodynamically stable at this time, protecting airway and no acute indication for ICU admission. I have asked [facility] to admit pt and we will be happy to assist should there be any change in his exam.    Pertinent  Medical History  Cirrhosis 2/2 etoh abuse (no etoh for 2 years) Portal htn Monoclonal gammopathy Iron def anemia Celiac disease osa  Significant Hospital Events: Including procedures, antibiotic start and stop dates in addition to other pertinent events   Presented to hospital 6/25, admitted 6/26  Interim History / Subjective:    Objective   Blood pressure 122/75, pulse (!) 101, temperature 97.9 F (36.6 C), resp. rate 16,  SpO2 100 %.       No intake or output data in the 24 hours ending 12/26/22 0048 There were no vitals filed for this visit.  Examination: General: arousable, oriented to self and place only, appears chronically ill laying supine in bed HENT: ncat, eomi, perrla, mm dry and bloody dried Lungs: ctab Cardiovascular: rrr Abdomen: protuberant, mildly distended, bs diminished, mild guarding but no rebound Extremities: no c/c/e Neuro: no focal deficits, oriented only to self and place GU: deferred  Resolved Hospital Problem list     Assessment & Plan:  Hepatic encephalopathy:  Cirrhosis Hematemesis -will attempt to lightly sedate and place NG for lactulose -does have enema's ordered but still same complication applies -ppi infusion -agree with ctx for now.  -follows with [facility], last endoscopy 08/2022, scheduled to see 01/2023, not on transplant list as of yet  Metabolic acidosis:  -recommend bicarb bolus and then infusion  -bicarb goal >20   Best Practice (right click and "Reselect all SmartList Selections" daily)   Diet/type: NPO DVT prophylaxis: other high risk of bleed GI prophylaxis: PPI Lines: N/A Foley:  N/A Code Status:  full code Last date of multidisciplinary goals of care discussion [with wife at bedside. 6/26]  Labs   CBC: Recent Labs  Lab 12/25/22 2114 12/25/22 2215  WBC 18.7*  --   HGB 16.0 16.0  HCT 45.3 47.0  MCV 91.5  --   PLT 266  --  Basic Metabolic Panel: Recent Labs  Lab 12/25/22 2114 12/25/22 2215  NA 134* 137  K 2.3* 3.7  CL 114* 120*  CO2 <7*  --   GLUCOSE 204* 194*  BUN 22* 37*  CREATININE 2.51* 2.50*  CALCIUM 9.4  --    GFR: CrCl cannot be calculated (Unknown ideal weight.). Recent Labs  Lab 12/25/22 2114  WBC 18.7*    Liver Function Tests: Recent Labs  Lab 12/25/22 2114  AST 43*  ALT 19  ALKPHOS 211*  BILITOT 5.7*  PROT 8.4*  ALBUMIN 3.9   No results for input(s): "LIPASE", "AMYLASE" in the last 168  hours. Recent Labs  Lab 12/25/22 2114  AMMONIA 308*    ABG    Component Value Date/Time   TCO2 7 (L) 12/25/2022 2215     Coagulation Profile: No results for input(s): "INR", "PROTIME" in the last 168 hours.  Cardiac Enzymes: No results for input(s): "CKTOTAL", "CKMB", "CKMBINDEX", "TROPONINI" in the last 168 hours.  HbA1C: No results found for: "HGBA1C"  CBG: Recent Labs  Lab 12/25/22 2141  GLUCAP 167*    Review of Systems:   Unobtainable 2/2 hepatic encephalopathy  Past Medical History:  He,  has a past medical history of Bipolar disorder (HCC), Chronic back pain, Cirrhosis (HCC), and Depression.   Surgical History:   Past Surgical History:  Procedure Laterality Date   APPENDECTOMY       Social History:   reports that he has never smoked. His smokeless tobacco use includes chew. He reports current alcohol use. He reports that he does not use drugs.   Family History:  His family history includes Heart disease in his mother.   Allergies Allergies  Allergen Reactions   Sertraline Other (See Comments)    Tremors, "felt weird"     Home Medications  Prior to Admission medications   Medication Sig Start Date End Date Taking? Authorizing Provider  folic acid (FOLVITE) 1 MG tablet Take 1 tablet (1 mg total) by mouth daily. 04/03/21  Yes Verdene Lennert, MD  furosemide (LASIX) 20 MG tablet Take 20 mg by mouth daily. 04/06/21  Yes [provider]  lactulose (CHRONULAC) 10 GM/15ML solution Take 45 mLs (30 g total) by mouth 3 (three) times daily. Titrate to a goal of 2-3 bowel movements per day. 04/02/21  Yes Verdene Lennert, MD  MILK THISTLE PO Take 1 capsule by mouth daily.   Yes [provider]  pantoprazole (PROTONIX) 40 MG tablet Take 1 tablet (40 mg total) by mouth daily. 04/03/21  Yes Verdene Lennert, MD  PEPCID 20 MG tablet Take 10-20 mg by mouth 2 (two) times daily as needed for heartburn or indigestion.   Yes [provider]   spironolactone (ALDACTONE) 50 MG tablet Take 1 tablet (50 mg total) by mouth daily. 04/03/21  Yes Verdene Lennert, MD  thiamine 100 MG tablet Take 1 tablet (100 mg total) by mouth daily. 04/03/21  Yes Verdene Lennert, MD  ferrous sulfate 325 (65 FE) MG tablet TAKE 1 TABLET (325 MG TOTAL) BY MOUTH DAILY WITH BREAKFAST. PLEASE TAKE WITH A SOURCE OF VITAMIN C Patient not taking: Reported on 12/25/2022 08/10/22   Jaci Standard, MD     Critical care time: excluding procedures.

## 2022-12-26 NOTE — TOC CM/SW Note (Signed)
Patient admitted for Acute metabolic encephalopathy with history of Alcoholic cirrhosis. Patient's wife reported that he is not taking lactulose for last 2 weeks.  Patient intubated.  TOC following.

## 2022-12-26 NOTE — H&P (Addendum)
History and Physical    Benjamin Knight RUE:[id number] DOB: [birth year] DOA: 12/25/2022  DOS: the patient was seen and examined on 12/26/2022  PCP: Benjamin Officer, PA   Patient coming from: Home   Chief Complaint:  Chief Complaint  Patient presents with   Altered Mental Status    HPI:  57 year old man medical history of celiac disease, monoclonal gammopathy, iron deficiency anemia, alcoholic hepatic cirrhosis, esophageal varices, portal gastropathy, bipolar disorder and generalized anxiety disorder presented to emergency department brought in by patient's wife with concern for altered mentation.  Patient's wife reported that since Friday she has been noticing her spent sounds different.  Over the weekend she started become more confused.  He is also occasionally become agitated and she also noticed some tremor as well.  Patient wife did that today she noticed vomiting of coffee-ground emesis.  Afterward patient became very weak and his wife brought him to the ED for evaluation.  During my evaluation, patient is altered.  Not opening his eyes with voice command.  He has response to pain.  I have called patient wife Benjamin Knight [phone].she reported that patient is not drinking for last couple of months.  Also she informed me to do all the resuscitation in case of adverse event and patient will be full code.  Per chart review, patient has outpatient GI follow-up with [facility].  Outpatient workup for Benjamin Knight disease was found negative on liver biopsy and 24-hour urine protein.  Diagnosis of hepatic cirrhosis secondary from long-term alcohol use.  ED Course: In the ED on initial presentation, patient is afebrile, tachycardic heart rate in between 101 to 114, respiratory rate 24, maintaining blood pressure well around 140/76. EKG showed normal sinus rhythm with ventricular premature complex and  RBBB. CBC significant for low potassium 2.3, BUN 22, elevated creatinine  2.51 (baseline creatinine in between 1.2 to 1.18, AST 43, elevated ALP 221 and GFR 29. CBC showed elevated WBC 18.7.  Very high ammonia 308. Pending UA. CT head no acute intracranial pathology. CT abdomen pelvis showed fluid throughout the entire colon can be seen in diarrheal illness, no definite associated colon wall thickening or inflammation.  Hepatic steatosis punctate nonobstructing right renal stone. NG tube placed orally to facilitate to give oral medication.  X-ray abdomen-showed normal gas pattern.  No evidence of free air. In the ED patient received lactulose enema once, ceftriaxone 1 g once, fentanyl 50 mcg twice, KCl total 40 mEq IV and 1 L of normal saline bolus.   Review of Systems:  Review of Systems  Unable to perform ROS: Mental status change    Past Medical History:  Diagnosis Date   Alcoholic cirrhosis (HCC)    Bipolar disorder (HCC)    Chronic back pain    Cirrhosis (HCC)    Depression    Esophageal varices (HCC)    Portal hypertensive gastropathy (HCC)     Past Surgical History:  Procedure Laterality Date   APPENDECTOMY       reports that he has never smoked. His smokeless tobacco use includes chew. He reports current alcohol use. He reports that he does not use drugs.  Allergies  Allergen Reactions   Sertraline Other (See Comments)    Tremors, "felt weird"    Family History  Problem Relation Age of Onset   Heart disease Mother     Prior to Admission medications   Medication Sig Start Date End Date Taking? Authorizing Provider  folic acid (FOLVITE) 1 MG tablet  Take 1 tablet (1 mg total) by mouth daily. 04/03/21  Yes Benjamin Lennert, MD  furosemide (LASIX) 20 MG tablet Take 20 mg by mouth daily. 04/06/21  Yes [provider]  lactulose (CHRONULAC) 10 GM/15ML solution Take 45 mLs (30 g total) by mouth 3 (three) times daily. Titrate to a goal of 2-3 bowel movements per day. 04/02/21  Yes Benjamin Lennert, MD  MILK THISTLE PO Take 1 capsule by  mouth daily.   Yes [provider]  pantoprazole (PROTONIX) 40 MG tablet Take 1 tablet (40 mg total) by mouth daily. 04/03/21  Yes Benjamin Lennert, MD  PEPCID 20 MG tablet Take 10-20 mg by mouth 2 (two) times daily as needed for heartburn or indigestion.   Yes [provider]  spironolactone (ALDACTONE) 50 MG tablet Take 1 tablet (50 mg total) by mouth daily. 04/03/21  Yes Benjamin Lennert, MD  thiamine 100 MG tablet Take 1 tablet (100 mg total) by mouth daily. 04/03/21  Yes Benjamin Lennert, MD  ferrous sulfate 325 (65 FE) MG tablet TAKE 1 TABLET (325 MG TOTAL) BY MOUTH DAILY WITH BREAKFAST. PLEASE TAKE WITH A SOURCE OF VITAMIN C Patient not taking: Reported on 12/25/2022 08/10/22   Benjamin Standard, MD     Physical Exam: Vitals:   12/26/22 0230 12/26/22 0330 12/26/22 0430 12/26/22 0515  BP: (!) 145/84 (!) 143/76 131/74   Pulse: (!) 112 (!) 113 (!) 113   Resp: 20 (!) 23 (!) 24   Temp:    97.8 F (36.6 C)  TempSrc:    Oral  SpO2: 100% 100% 100%   Weight:      Height:        Physical Exam HENT:     Mouth/Throat:     Mouth: Mucous membranes are dry.     Comments: Dry blood surrounding the lip and over the tongue tongue Eyes:     Conjunctiva/sclera: Conjunctivae normal.  Cardiovascular:     Rate and Rhythm: Normal rate and regular rhythm.  Pulmonary:     Effort: Pulmonary effort is normal.     Breath sounds: Normal breath sounds.  Abdominal:     General: Bowel sounds are normal. There is no distension.     Palpations: Abdomen is soft.  Musculoskeletal:        General: No swelling.     Cervical back: Neck supple.     Right lower leg: No edema.     Left lower leg: No edema.  Skin:    General: Skin is warm.     Capillary Refill: Capillary refill takes less than 2 seconds.  Neurological:     Comments: Altered mentation  Psychiatric:     Comments: Unable to assess      Labs on Admission: I have personally reviewed following labs and imaging  studies  CBC: Recent Labs  Lab 12/25/22 2114 12/25/22 2215  WBC 18.7*  --   HGB 16.0 16.0  HCT 45.3 47.0  MCV 91.5  --   PLT 266  --    Basic Metabolic Panel: Recent Labs  Lab 12/25/22 2114 12/25/22 2215  NA 134* 137  K 2.3* 3.7  CL 114* 120*  CO2 <7*  --   GLUCOSE 204* 194*  BUN 22* 37*  CREATININE 2.51* 2.50*  CALCIUM 9.4  --    GFR: Estimated Creatinine Clearance: 35.8 mL/min (A) (by C-G formula based on SCr of 2.5 mg/dL (H)). Liver Function Tests: Recent Labs  Lab 12/25/22 2114  AST  43*  ALT 19  ALKPHOS 211*  BILITOT 5.7*  PROT 8.4*  ALBUMIN 3.9   No results for input(s): "LIPASE", "AMYLASE" in the last 168 hours. Recent Labs  Lab 12/25/22 2114  AMMONIA 308*   Coagulation Profile: No results for input(s): "INR", "PROTIME" in the last 168 hours. Cardiac Enzymes: No results for input(s): "CKTOTAL", "CKMB", "CKMBINDEX", "TROPONINI", "TROPONINIHS" in the last 168 hours. BNP (last 3 results) No results for input(s): "BNP" in the last 8760 hours. HbA1C: No results for input(s): "HGBA1C" in the last 72 hours. CBG: Recent Labs  Lab 12/25/22 2141  GLUCAP 167*   Lipid Profile: No results for input(s): "CHOL", "HDL", "LDLCALC", "TRIG", "CHOLHDL", "LDLDIRECT" in the last 72 hours. Thyroid Function Tests: No results for input(s): "TSH", "T4TOTAL", "FREET4", "T3FREE", "THYROIDAB" in the last 72 hours. Anemia Panel: No results for input(s): "VITAMINB12", "FOLATE", "FERRITIN", "TIBC", "IRON", "RETICCTPCT" in the last 72 hours. Urine analysis:    Component Value Date/Time   COLORURINE YELLOW 12/25/2021 1024   APPEARANCEUR HAZY (A) 12/25/2021 1024   LABSPEC 1.008 12/25/2021 1024   PHURINE 5.0 12/25/2021 1024   GLUCOSEU NEGATIVE 12/25/2021 1024   HGBUR NEGATIVE 12/25/2021 1024   BILIRUBINUR NEGATIVE 12/25/2021 1024   KETONESUR NEGATIVE 12/25/2021 1024   PROTEINUR NEGATIVE 12/25/2021 1024   NITRITE NEGATIVE 12/25/2021 1024   LEUKOCYTESUR LARGE (A)  12/25/2021 1024    Radiological Exams on Admission: I have personally reviewed images DG Abd Portable 1 View  Result Date: 12/26/2022 CLINICAL DATA:  Nasogastric tube placement. EXAM: PORTABLE ABDOMEN - 1 VIEW COMPARISON:  None Available. FINDINGS: A nasogastric tube is seen with its distal tip noted within the body of the stomach. The distal side hole sits approximally 4.4 cm distal to the expected region of the gastroesophageal junction. The bowel gas pattern is normal. No radio-opaque calculi or other significant radiographic abnormality are seen. IMPRESSION: Nasogastric tube positioning, as described above. Electronically Signed   By: Aram Candela M.D.   On: 12/26/2022 02:18   CT CHEST ABDOMEN PELVIS WO CONTRAST  Result Date: 12/25/2022 CLINICAL DATA:  Abdominal pain. History of cirrhosis and hepatic encephalopathy. EXAM: CT CHEST, ABDOMEN AND PELVIS WITHOUT CONTRAST TECHNIQUE: Multidetector CT imaging of the chest, abdomen and pelvis was performed following the Knight protocol without IV contrast. RADIATION DOSE REDUCTION: This exam was performed according to the departmental dose-optimization program which includes automated exposure control, adjustment of the mA and/or kV according to patient size and/or use of iterative reconstruction technique. COMPARISON:  None Available. FINDINGS: CT CHEST FINDINGS Cardiovascular: Borderline cardiomegaly. No pericardial effusion. Occasional coronary artery calcifications. The thoracic aorta is normal in caliber. Motion artifact through the chest limits assessment. Mediastinum/Nodes: No gross mediastinal adenopathy, allowing for motion artifact limitations. No esophageal wall thickening, the upper esophagus is patulous. Lungs/Pleura: Breathing motion artifact limits parenchymal assessment, particularly in the lower lobes. Allowing for this no evidence of focal airspace disease or pleural effusion. More detailed assessment is limited. Musculoskeletal:  Thoracic spondylosis with anterior spurring. There are no acute or suspicious osseous abnormalities. Bilateral gynecomastia. CT ABDOMEN PELVIS FINDINGS Hepatobiliary: Nodular hepatic contours typical of cirrhosis. Motion artifact through the liver limits detailed assessment. Fluid density structure in the central dome measures 2.7 cm, likely cyst. Gallbladder is tentatively visualized and normal, although motion obscured. Pancreas: No gross pancreatic inflammation. Spleen: Upper normal in size, 12.3 cm greatest AP dimension. Adrenals/Urinary Tract: No adrenal nodule. No hydronephrosis. Punctate nonobstructing stone in the lower pole of the right kidney. Urinary bladder is only  minimally distended. Wall thickening is greater than expected for degree of distension. Stomach/Bowel: The stomach is distended with ingested material. Small bowel is decompressed. Fluid throughout the entire colon. No definite associated colonic wall thickening or inflammation, allowing for motion artifact limitations. The appendix is not confidently visualized on the current exam. Vascular/Lymphatic: Aortic and branch atherosclerosis. No aneurysm. Borderline retroperitoneal left periaortic node measuring 13 mm series 3, image 86. Reproductive: Prostate is unremarkable. Other: No significant ascites.  No free intra-abdominal air. Musculoskeletal: Lumbar spondylosis with anterior spurring. There are no acute or suspicious osseous abnormalities. IMPRESSION: 1. Fluid throughout the entire colon, can be seen with diarrheal illness. No definite associated colonic wall thickening or inflammation, allowing for motion artifact limitations. 2. Hepatic cirrhosis. 3. Punctate nonobstructing right renal stone. 4. No acute findings in the thorax allowing for motion artifact limitations. 5. Coronary artery calcifications and aortic atherosclerosis. Aortic Atherosclerosis (ICD10-I70.0). Electronically Signed   By: Narda Rutherford M.D.   On: 12/25/2022  23:17   CT Head Wo Contrast  Result Date: 12/25/2022 CLINICAL DATA:  Altered mental status. EXAM: CT HEAD WITHOUT CONTRAST TECHNIQUE: Contiguous axial images were obtained from the base of the skull through the vertex without intravenous contrast. RADIATION DOSE REDUCTION: This exam was performed according to the departmental dose-optimization program which includes automated exposure control, adjustment of the mA and/or kV according to patient size and/or use of iterative reconstruction technique. COMPARISON:  Head CT dated 03/30/2021. FINDINGS: Evaluation of this exam is limited due to motion artifact. Brain: The ventricles and sulci are appropriate size for the patient's age. The gray-white matter discrimination is preserved. There is no acute intracranial hemorrhage. No mass effect or midline shift. No extra-axial fluid collection. Vascular: No hyperdense vessel or unexpected calcification. Skull: Normal. Negative for fracture or focal lesion. Sinuses/Orbits: No acute finding. Other: None IMPRESSION: No acute intracranial pathology. Electronically Signed   By: Elgie Collard M.D.   On: 12/25/2022 23:13   DG Chest Portable 1 View  Result Date: 12/25/2022 CLINICAL DATA:  Worsening hepatic encephalopathy. EXAM: PORTABLE CHEST 1 VIEW COMPARISON:  March 30, 2021 FINDINGS: The heart size and mediastinal contours are within normal limits. Low lung volumes are seen with subsequent crowding of the bronchovascular lung markings. There is no evidence of an acute infiltrate, pleural effusion or pneumothorax. The visualized skeletal structures are unremarkable. IMPRESSION: Low lung volumes without acute or active cardiopulmonary disease. Electronically Signed   By: Aram Candela M.D.   On: 12/25/2022 22:53    EKG: My personal interpretation of EKG shows: Normal sinus rhythm.  No ST and no abnormality.    Assessment/Plan: Principal Problem:   Acute metabolic encephalopathy Active Problems:    Decompensated hepatic cirrhosis (HCC)   History of celiac disease   History of bipolar disorder   AKI (acute kidney injury) (HCC)   Upper GI bleed   Monoclonal gammopathy   Portal hypertensive gastropathy (HCC)   Chronic alcohol use   Hypokalemia    Assessment and Plan: No notes have been filed under this hospital service. Service: Hospitalist   Acute metabolic encephalopathy Hyperammonemia Patient's wife reported that he is not taking lactulose for last 2 weeks.   Patient's wife reported that since Friday she has been noticing her spent sounds different.  Over the weekend she started become more confused.  He is also occasionally become agitated and she also noticed some tremor as well. -Initial presentation to ED he found to have altered mentation, agitated, and not able to follow  any commands. - Head CT ruled out any acute endocrine abnormality - Elevated ammonia 308 -Hyperammonemia in the setting of established diagnosis of hepatic cirrhosis.  Patient is compliant with lactulose. - In the ED patient got lactulose 300 mL enema -G-tube has been placed in the ED in the setting of altered mentation to give oral medication - Continue lactulose 30 g 3 times daily with G-tube.  Give lactulose with goal of 3 to bowel movements in a day.  Recheck ammonia later today.   Decompensated hepatic cirrhosis History of portal hypertensive gastropathy History of esophageal varices Concern for upper and lower GI bleed -Patient presented with hyperammonemia, patient's wife complaining about 1 episode of coffee-ground emesis at home. -At home patient is on Lasix 20 mg daily and spironolactone 40 mg daily.  Lasix and spironolactone on hold in the setting of AKI - Fecal occult stool positive -Protonix drip was started on ED. continuing the Protonix drip. -Starting ceftriaxone 2 g IV daily with concern for GI bleed -Adding propranolol 20 mg twice daily.  - Checking blood culture and urine  culture. -Reach out and consultant Dr. Ewing Schlein who is on-call tonight for further recommendation.   Prerenal AKI Concern for development of hepatorenal syndrome -On presentation creatinine found elevated 2.51.  Baseline creatinine between 1.1-1.2.  Baseline renal function within normal range. Given patient is tachycardic having GI bleed concern for more dehydration rather than volume overload which probably development of AKI.  Also CT abdomen pelvis did not showed any evidence of ascites. -Checking UA, and urine sodium -Holding Lasix and aspirin lactone in setting of AKI. -Giving gentle hydration 50 cc/h for 1 day   History of chronic alcohol use - Not currently drinking any alcohol confirmed with patient and wife. -As there is no concern for recent alcohol use not initiating CIWA protocol  SIRS - Elevated WBC count 18.7.  Patient is afebrile.  Tachycardic heart rate above 90.  Meets the SIRS criteria -Continue broad-spectrum antibiotic ceftriaxone 2 g daily in setting of GI bleed. - Checking blood culture and urine culture.  Hypokalemia - On initial presentation patient found to have low potassium 2.3.  Which has been repleted with IV KCl total 40 mEq. -Check electrolytes replete as needed. -Checking mag level  DVT prophylaxis: SCDs.  Holding pharmacological anticoagulation in setting of GI bleeding Code Status: Full Code Diet: N.p.o. Family Communication: Patient's wife Disposition Plan:   Consults: Gastroenterology Admission status: Inpatient, Telemetry bed   Tereasa Coop, MD [facility] 12/26/2022, 6:42 AM    If 7PM-7AM, please contact night-coverage [email or web address] Password [email or web address]

## 2022-12-26 NOTE — Procedures (Signed)
Intubation Procedure Note  BRYAR DAHMS  [id number]  [birth year]  Date:12/26/22  Time:11:08 AM   Provider Performing:Jaionna Weisse    Procedure: Intubation (31500)  Indication(s) Respiratory Failure  Consent Unable to obtain consent due to emergent nature of procedure.   Anesthesia Etomidate and Rocuronium   Time Out Verified patient identification, verified procedure, site/side was marked, verified correct patient position, special equipment/implants available, medications/allergies/relevant history reviewed, required imaging and test results available.   Sterile Technique Usual hand hygeine, masks, and gloves were used   Procedure Description Patient positioned in bed supine.  Sedation given as noted above.  Patient was intubated with endotracheal tube using  MAC4 .  View was Grade 1 full glottis .  Number of attempts was 1.  Colorimetric CO2 detector was consistent with tracheal placement.   Complications/Tolerance None; patient tolerated the procedure well. Chest X-ray is ordered to verify placement.   EBL Minimal   Specimen(s) None

## 2022-12-26 NOTE — Consult Note (Signed)
Consultation  Referring Provider:  [facility]  Primary Care Physician:  Delma Officer, PA Primary Gastroenterologist:  [facility]       Reason for Consultation:     Decompensated Cirrhosis  LOS: 0 days          HPI:   Benjamin Knight is a 57 y.o. male with past medical history significant for alcoholic liver cirrhosis, pulmonary hypertension, esophageal varices, presents for evaluation of decompensated cirrhosis with hepatic encephalopathy.  Wife at bedside, provided history since patient is intubated.  Patient has history of alcoholic cirrhosis and follows with [facility].  Diagnosed 06/2021.  Last alcoholic drink was Labor Day 2022.  Previous history of "binge drinking" where he would drink 1/5 of liquor every weekend. Last EGD 09/2021 which revealed small varices and portal gastropathy.  He gets annual MRIs for pancreatic cyst noted on MRI December 2022.  Patient's wife states he is compliant with his Lasix and spironolactone.  She states he has noticed his abdomen has progressively become more distended, though states patient denies this.  No previous history of paracenteses.    She reports he had become increasingly more confused and became noncompliant with lactulose as he did not like how often it made him go to the bathroom.  Has not taken lactulose in more than 3 weeks.  She stated she left to go get gas in her car and came back and he was vomiting and confused.  Reports she was unsure if there is blood in his vomit or recent strawberries he ate.  Also noticed dark material around the sink but unsure if it was vomit or his chewing tobacco.  Upon admission he was slightly tachypneic, somnolent but arousable.  He then decompensated during the night requiring urgent intubation and admission to ICU.  Patient was put on octreotide, PPI drip, ceftriaxone and lactulose  Pertinent workup CT chest abdomen pelvis without contrast:  -Fluid throughout entire colon (diarrheal illness), no colonic  wall thickening or inflammation. -Cirrhosis, fluid density and central dome measuring 2.7 cm (likely cyst).  Spleen upper normal in size (12.3 cm) -No significant ascites -R renal stone  Chest x-ray without acute abnormality  -MELD 3.0: 31 -MELD-Na: 30 -Ammonia 308 on admission (now 247) -PT 23.8, INR 2.1 -Leukocytosis with WBC 19.7 -Platelets 189 -BUN 25, creatinine 2.35, GFR 31 -AST 45/ALT 16/alk phos 188 -Total bilirubin 5.0 -Potassium 2.2 -Sodium 132 -Ethanol less than 10 -Fecal occult positive   PREVIOUS GI HISTORY Transjugular liver biopsy on 10/13/2021. The free hepatic venous pressure was 2 mmHg with a wedged pressure of 9 mmHg for a gradient of 7 mmHg. The histologic review shows cirrhosis with steatohepatitis. Copper quantification was normal.  EGD was performed 10/20/2021. This revealed small varices with portal gastropathy.   Wife reports he has had a colonoscopy within the last 10 years that was normal  Past Medical History:  Diagnosis Date   Alcoholic cirrhosis (HCC)    Bipolar disorder (HCC)    Chronic back pain    Cirrhosis (HCC)    Depression    Esophageal varices (HCC)    Portal hypertensive gastropathy (HCC)     Surgical History:  He  has a past surgical history that includes Appendectomy. Family History:  His family history includes Heart disease in his mother. Social History:   reports that he has never smoked. His smokeless tobacco use includes chew. He reports current alcohol use. He reports that he does not use drugs.  Prior  to Admission medications   Medication Sig Start Date End Date Taking? Authorizing Provider  folic acid (FOLVITE) 1 MG tablet Take 1 tablet (1 mg total) by mouth daily. 04/03/21  Yes Verdene Lennert, MD  furosemide (LASIX) 20 MG tablet Take 20 mg by mouth daily. 04/06/21  Yes [provider]  lactulose (CHRONULAC) 10 GM/15ML solution Take 45 mLs (30 g total) by mouth 3 (three) times daily. Titrate to a goal of 2-3  bowel movements per day. 04/02/21  Yes Verdene Lennert, MD  MILK THISTLE PO Take 1 capsule by mouth daily.   Yes [provider]  pantoprazole (PROTONIX) 40 MG tablet Take 1 tablet (40 mg total) by mouth daily. 04/03/21  Yes Verdene Lennert, MD  PEPCID 20 MG tablet Take 10-20 mg by mouth 2 (two) times daily as needed for heartburn or indigestion.   Yes [provider]  spironolactone (ALDACTONE) 50 MG tablet Take 1 tablet (50 mg total) by mouth daily. 04/03/21  Yes Verdene Lennert, MD  thiamine 100 MG tablet Take 1 tablet (100 mg total) by mouth daily. 04/03/21  Yes Verdene Lennert, MD  ferrous sulfate 325 (65 FE) MG tablet TAKE 1 TABLET (325 MG TOTAL) BY MOUTH DAILY WITH BREAKFAST. PLEASE TAKE WITH A SOURCE OF VITAMIN C Patient not taking: Reported on 12/25/2022 08/10/22   Jaci Standard, MD    Current Facility-Administered Medications  Medication Dose Route Frequency Provider Last Rate Last Admin   cefTRIAXone (ROCEPHIN) 2 g in sodium chloride 0.9 % 100 mL IVPB  2 g Intravenous Q24H Janalyn Shy, Subrina, MD   Stopped at 12/26/22 0725   Chlorhexidine Gluconate Cloth 2 % PADS 6 each  6 each Topical Daily Cheri Fowler, MD   6 each at 12/26/22 0925   dexmedetomidine (PRECEDEX) 400 MCG/100ML (4 mcg/mL) infusion  0-1.2 mcg/kg/hr Intravenous Titrated Cheri Fowler, MD 9.4 mL/hr at 12/26/22 1030 0.4 mcg/kg/hr at 12/26/22 1030   docusate (COLACE) 50 MG/5ML liquid 100 mg  100 mg Per Tube BID PRN Doristine Counter, RPH       etomidate (AMIDATE) 2 MG/ML injection            folic acid (FOLVITE) tablet 1 mg  1 mg Per Tube Daily Calton Dach I, RPH   1 mg at 12/26/22 0940   lactulose (CHRONULAC) 10 GM/15ML solution 30 g  30 g Per Tube TID Janalyn Shy, Subrina, MD   30 g at 12/26/22 0939   octreotide (SANDOSTATIN) 500 mcg in sodium chloride 0.9 % 250 mL (2 mcg/mL) infusion  50 mcg/hr Intravenous Continuous Cheri Fowler, MD 25 mL/hr at 12/26/22 1030 50 mcg/hr at 12/26/22 1030   Oral care mouth rinse   15 mL Mouth Rinse Q2H Cheri Fowler, MD       Oral care mouth rinse  15 mL Mouth Rinse PRN Cheri Fowler, MD       [START ON 12/29/2022] pantoprazole (PROTONIX) injection 40 mg  40 mg Intravenous Q12H Cheri Fowler, MD       pantoprozole (PROTONIX) 80 mg /NS 100 mL infusion  8 mg/hr Intravenous Continuous Sundil, Subrina, MD 10 mL/hr at 12/26/22 1030 8 mg/hr at 12/26/22 1030   polyethylene glycol (MIRALAX / GLYCOLAX) packet 17 g  17 g Per Tube Daily PRN Minor, Vilinda Blanks, NP       potassium chloride (KLOR-CON) packet 40 mEq  40 mEq Per Tube BID Cheri Fowler, MD       potassium chloride 10 mEq in 100 mL IVPB  10 mEq Intravenous Q1 Hr x 6 Calton Dach I, [place] 100 mL/hr at 12/26/22 1047 10 mEq at 12/26/22 1047   rifaximin (XIFAXAN) tablet 550 mg  550 mg Per Tube BID Cheri Fowler, MD       rocuronium bromide 100 MG/10ML SOSY            sodium bicarbonate 150 mEq in sterile water 1,150 mL infusion   Intravenous Continuous Cheri Fowler, MD 75 mL/hr at 12/26/22 1049 New Bag at 12/26/22 1049   thiamine (VITAMIN B1) tablet 100 mg  100 mg Per Tube Daily Calton Dach I, RPH   100 mg at 12/26/22 4098    Allergies as of 12/25/2022 - Review Complete 12/25/2022  Allergen Reaction Noted   Sertraline Other (See Comments) 04/07/2021    Review of Systems  Unable to perform ROS: Critical illness       Physical Exam:  Vital signs in last 24 hours: Temp:  [97.3 F (36.3 C)-97.9 F (36.6 C)] 97.3 F (36.3 C) (06/26 1126) Pulse Rate:  [62-123] 83 (06/26 1100) Resp:  [16-28] 28 (06/26 1100) BP: (93-145)/(55-90) 93/62 (06/26 1100) SpO2:  [86 %-100 %] 100 % (06/26 1100) FiO2 (%):  [40 %-100 %] 40 % (06/26 0908) Weight:  [87 kg-94 kg] 87 kg (06/26 0916)   Last BM recorded by nurses in past 5 days No data recorded  Physical Exam Constitutional:      Comments: Intubated and sedated  HENT:     Mouth/Throat:     Comments: Dry mucous membranes, dried blood Eyes:     General: Scleral icterus  present.  Cardiovascular:     Rate and Rhythm: Normal rate and regular rhythm.  Pulmonary:     Comments: Ventilated breath sounds Abdominal:     General: Bowel sounds are normal. There is no distension.     Palpations: Abdomen is soft. There is no mass.     Tenderness: There is no abdominal tenderness. There is no guarding or rebound.     Hernia: No hernia is present.  Musculoskeletal:        General: No swelling. Normal range of motion.  Skin:    General: Skin is warm and dry.     Coloration: Skin is not jaundiced.  Neurological:     Comments: Sedated      LAB RESULTS: Recent Labs    12/25/22 2114 12/25/22 2215 12/26/22 0820 12/26/22 0850 12/26/22 0926  WBC 18.7*  --  19.7*  --   --   HGB 16.0   < > 14.5 14.3 14.6  HCT 45.3   < > 41.8 42.0 43.0  PLT 266  --  189  --   --    < > = values in this interval not displayed.   BMET Recent Labs    12/25/22 2114 12/25/22 2215 12/26/22 1191 12/26/22 0820 12/26/22 0850 12/26/22 0926 12/26/22 0933  NA 134*   < > 132*  --  141 140 135  K 2.3*   < > 2.2*   < > 2.3* 2.3* 2.3*  CL 114*   < > 117*  --   --  122* 114*  CO2 <7*  --  <7*  --   --   --  <7*  GLUCOSE 204*   < > 155*  --   --  152* 185*  BUN 22*   < > 25*  --   --  31* 27*  CREATININE 2.51*   < > 2.35*  --   --  2.40* 2.41*  CALCIUM 9.4  --  8.8*  --   --   --  9.0   < > = values in this interval not displayed.   LFT Recent Labs    12/26/22 0638  PROT 7.3  ALBUMIN 3.3*  AST 45*  ALT 16  ALKPHOS 188*  BILITOT 5.0*   PT/INR Recent Labs    12/26/22 0820  LABPROT 23.8*  INR 2.1*    STUDIES: DG CHEST PORT 1 VIEW  Result Date: 12/26/2022 CLINICAL DATA:  Intubated EXAM: PORTABLE CHEST 1 VIEW COMPARISON:  12/25/2022 FINDINGS: Endotracheal tube tip 3 cm above the carina. Orogastric or nasogastric tube enters the stomach. Patchy density persists at the lung bases, left more than right, possibly slightly worsened. This could be due to atelectasis or  bronchopneumonia. IMPRESSION: 1. Endotracheal tube tip 3 cm above the carina. 2. Patchy density at the lung bases, left more than right, possibly slightly worsened. Electronically Signed   By: Paulina Fusi M.D.   On: 12/26/2022 08:26   DG Abd Portable 1 View  Result Date: 12/26/2022 CLINICAL DATA:  Nasogastric tube placement. EXAM: PORTABLE ABDOMEN - 1 VIEW COMPARISON:  None Available. FINDINGS: A nasogastric tube is seen with its distal tip noted within the body of the stomach. The distal side hole sits approximally 4.4 cm distal to the expected region of the gastroesophageal junction. The bowel gas pattern is normal. No radio-opaque calculi or other significant radiographic abnormality are seen. IMPRESSION: Nasogastric tube positioning, as described above. Electronically Signed   By: Aram Candela M.D.   On: 12/26/2022 02:18   CT CHEST ABDOMEN PELVIS WO CONTRAST  Result Date: 12/25/2022 CLINICAL DATA:  Abdominal pain. History of cirrhosis and hepatic encephalopathy. EXAM: CT CHEST, ABDOMEN AND PELVIS WITHOUT CONTRAST TECHNIQUE: Multidetector CT imaging of the chest, abdomen and pelvis was performed following the standard protocol without IV contrast. RADIATION DOSE REDUCTION: This exam was performed according to the departmental dose-optimization program which includes automated exposure control, adjustment of the mA and/or kV according to patient size and/or use of iterative reconstruction technique. COMPARISON:  None Available. FINDINGS: CT CHEST FINDINGS Cardiovascular: Borderline cardiomegaly. No pericardial effusion. Occasional coronary artery calcifications. The thoracic aorta is normal in caliber. Motion artifact through the chest limits assessment. Mediastinum/Nodes: No gross mediastinal adenopathy, allowing for motion artifact limitations. No esophageal wall thickening, the upper esophagus is patulous. Lungs/Pleura: Breathing motion artifact limits parenchymal assessment, particularly in the  lower lobes. Allowing for this no evidence of focal airspace disease or pleural effusion. More detailed assessment is limited. Musculoskeletal: Thoracic spondylosis with anterior spurring. There are no acute or suspicious osseous abnormalities. Bilateral gynecomastia. CT ABDOMEN PELVIS FINDINGS Hepatobiliary: Nodular hepatic contours typical of cirrhosis. Motion artifact through the liver limits detailed assessment. Fluid density structure in the central dome measures 2.7 cm, likely cyst. Gallbladder is tentatively visualized and normal, although motion obscured. Pancreas: No gross pancreatic inflammation. Spleen: Upper normal in size, 12.3 cm greatest AP dimension. Adrenals/Urinary Tract: No adrenal nodule. No hydronephrosis. Punctate nonobstructing stone in the lower pole of the right kidney. Urinary bladder is only minimally distended. Wall thickening is greater than expected for degree of distension. Stomach/Bowel: The stomach is distended with ingested material. Small bowel is decompressed. Fluid throughout the entire colon. No definite associated colonic wall thickening or inflammation, allowing for motion artifact limitations. The appendix is not confidently visualized on the current exam. Vascular/Lymphatic: Aortic and branch atherosclerosis. No aneurysm. Borderline retroperitoneal left periaortic node measuring 13 mm series  3, image 86. Reproductive: Prostate is unremarkable. Other: No significant ascites.  No free intra-abdominal air. Musculoskeletal: Lumbar spondylosis with anterior spurring. There are no acute or suspicious osseous abnormalities. IMPRESSION: 1. Fluid throughout the entire colon, can be seen with diarrheal illness. No definite associated colonic wall thickening or inflammation, allowing for motion artifact limitations. 2. Hepatic cirrhosis. 3. Punctate nonobstructing right renal stone. 4. No acute findings in the thorax allowing for motion artifact limitations. 5. Coronary artery  calcifications and aortic atherosclerosis. Aortic Atherosclerosis (ICD10-I70.0). Electronically Signed   By: Narda Rutherford M.D.   On: 12/25/2022 23:17   CT Head Wo Contrast  Result Date: 12/25/2022 CLINICAL DATA:  Altered mental status. EXAM: CT HEAD WITHOUT CONTRAST TECHNIQUE: Contiguous axial images were obtained from the base of the skull through the vertex without intravenous contrast. RADIATION DOSE REDUCTION: This exam was performed according to the departmental dose-optimization program which includes automated exposure control, adjustment of the mA and/or kV according to patient size and/or use of iterative reconstruction technique. COMPARISON:  Head CT dated 03/30/2021. FINDINGS: Evaluation of this exam is limited due to motion artifact. Brain: The ventricles and sulci are appropriate size for the patient's age. The gray-white matter discrimination is preserved. There is no acute intracranial hemorrhage. No mass effect or midline shift. No extra-axial fluid collection. Vascular: No hyperdense vessel or unexpected calcification. Skull: Normal. Negative for fracture or focal lesion. Sinuses/Orbits: No acute finding. Other: None IMPRESSION: No acute intracranial pathology. Electronically Signed   By: Elgie Collard M.D.   On: 12/25/2022 23:13   DG Chest Portable 1 View  Result Date: 12/25/2022 CLINICAL DATA:  Worsening hepatic encephalopathy. EXAM: PORTABLE CHEST 1 VIEW COMPARISON:  March 30, 2021 FINDINGS: The heart size and mediastinal contours are within normal limits. Low lung volumes are seen with subsequent crowding of the bronchovascular lung markings. There is no evidence of an acute infiltrate, pleural effusion or pneumothorax. The visualized skeletal structures are unremarkable. IMPRESSION: Low lung volumes without acute or active cardiopulmonary disease. Electronically Signed   By: Aram Candela M.D.   On: 12/25/2022 22:53      Impression    Decompensated cirrhosis CT  chest abdomen pelvis without contrast:  -Fluid throughout entire colon (diarrheal illness), no colonic wall thickening or inflammation. -Cirrhosis, fluid density and central dome measuring 2.7 cm (likely cyst).  Spleen upper normal in size (12.3 cm) -No significant ascites Chest x-ray without acute abnormality -MELD 3.0: 31 -MELD-Na: 30 -Ammonia 308 on admission (now 247) -PT 23.8, INR 2.1 -Leukocytosis with WBC 19.7 -Platelets 189 -AST 45/ALT 16/alk phos 188 -Total bilirubin 5.0 -Ethanol less than 10 -Fecal occult positive Hepatic encephalopathy secondary to noncompliance with lactulose and decompensated cirrhosis. coffee-ground emesis.  Hemoglobin stable.  History of varices.  No ascites on CT though wife reports progressive distention in abdomen. Physical exam not convincing for ascites.  Upper GI bleed -unknown presence of melena per wife, no melena since admission -coffee ground emesis - BUN elevated secondary to AKI - hgb 16.0 With history of varices and presence of coffee ground emesis suspect UGIB, though hgb currently stable  AKI -BUN 25, creatinine 2.35, GFR 31  Hypokalemia  Hyponatremia -Potassium 2.2 -Sodium 132  Chronic alcohol use -Last drink Labor Day 2022   Plan   DECOMPENSATED CIRRHOSIS - Doppler r/o portal vein thrombosis once he is extubated - Serial INR, daily - monitor daily MELD - blood culture pending - fluid restrict to 2L due to hyponatremia - Broad spectrum abx (with empiric  coverage for SBP) - Lactulose, 30ml titrate to 3bms per day - Rifaximin 550mg  bid - MVA, folic acid, thiamine  UPPER GI BLEED -Continue daily CBC and transfuse as needed to maintain HGB > 7  -Continue PPI infusion -Continue octreotide -Consider EGD when he is more stable  AKI -Diuretics on hold  ELECTROLYTE ABNORMALITIES - Monitor and replenish as needed  Thank you for your kind consultation, we will continue to follow.  Kaiyah Eber TIRAS BIANCHINI  12/26/2022, 11:34  AM

## 2022-12-26 NOTE — ED Notes (Addendum)
At [phone] RN caring for patient was concerned about patient decompensating.  This RN messaged hospitalist to come reassess patient as patient is now unresponsive with GCS 6 tachy and tachypneic.  Dr Janalyn Shy responded with patient is like that because he got fentanyl.  Fentanly was given around midnight.  She then changed bed to progressive.  Patient continued to decompensate and having vent trigemny and AM hospitalist consulted CCM and patient moved to Tra C for intubation.

## 2022-12-27 ENCOUNTER — Inpatient Hospital Stay (HOSPITAL_COMMUNITY): Payer: BC Managed Care – PPO

## 2022-12-27 DIAGNOSIS — R578 Other shock: Secondary | ICD-10-CM | POA: Diagnosis not present

## 2022-12-27 DIAGNOSIS — J9601 Acute respiratory failure with hypoxia: Secondary | ICD-10-CM | POA: Diagnosis not present

## 2022-12-27 DIAGNOSIS — G9341 Metabolic encephalopathy: Secondary | ICD-10-CM | POA: Diagnosis not present

## 2022-12-27 DIAGNOSIS — K7460 Unspecified cirrhosis of liver: Secondary | ICD-10-CM | POA: Diagnosis not present

## 2022-12-27 DIAGNOSIS — N179 Acute kidney failure, unspecified: Secondary | ICD-10-CM | POA: Diagnosis not present

## 2022-12-27 DIAGNOSIS — K7682 Hepatic encephalopathy: Secondary | ICD-10-CM | POA: Diagnosis not present

## 2022-12-27 LAB — COMPREHENSIVE METABOLIC PANEL
ALT: 15 U/L (ref 0–44)
AST: 41 U/L (ref 15–41)
Albumin: 2.7 g/dL — ABNORMAL LOW (ref 3.5–5.0)
Alkaline Phosphatase: 138 U/L — ABNORMAL HIGH (ref 38–126)
Anion gap: 12 (ref 5–15)
BUN: 41 mg/dL — ABNORMAL HIGH (ref 6–20)
CO2: 13 mmol/L — ABNORMAL LOW (ref 22–32)
Calcium: 7.5 mg/dL — ABNORMAL LOW (ref 8.9–10.3)
Chloride: 113 mmol/L — ABNORMAL HIGH (ref 98–111)
Creatinine, Ser: 4.61 mg/dL — ABNORMAL HIGH (ref 0.61–1.24)
GFR, Estimated: 14 mL/min — ABNORMAL LOW (ref >60)
Glucose, Bld: 161 mg/dL — ABNORMAL HIGH (ref 70–99)
Potassium: 2.3 mmol/L — CL (ref 3.5–5.1)
Sodium: 138 mmol/L (ref 135–145)
Total Bilirubin: 4.2 mg/dL — ABNORMAL HIGH (ref 0.3–1.2)
Total Protein: 5.6 g/dL — ABNORMAL LOW (ref 6.5–8.1)

## 2022-12-27 LAB — BASIC METABOLIC PANEL
Anion gap: 11 (ref 5–15)
Anion gap: 18 — ABNORMAL HIGH (ref 5–15)
BUN: 35 mg/dL — ABNORMAL HIGH (ref 6–20)
BUN: 36 mg/dL — ABNORMAL HIGH (ref 6–20)
CO2: 10 mmol/L — ABNORMAL LOW (ref 22–32)
CO2: 9 mmol/L — ABNORMAL LOW (ref 22–32)
Calcium: 8.5 mg/dL — ABNORMAL LOW (ref 8.9–10.3)
Calcium: 8.6 mg/dL — ABNORMAL LOW (ref 8.9–10.3)
Chloride: 114 mmol/L — ABNORMAL HIGH (ref 98–111)
Chloride: 119 mmol/L — ABNORMAL HIGH (ref 98–111)
Creatinine, Ser: 3.49 mg/dL — ABNORMAL HIGH (ref 0.61–1.24)
Creatinine, Ser: 3.65 mg/dL — ABNORMAL HIGH (ref 0.61–1.24)
GFR, Estimated: 18 mL/min — ABNORMAL LOW (ref >60)
GFR, Estimated: 19 mL/min — ABNORMAL LOW (ref >60)
Glucose, Bld: 158 mg/dL — ABNORMAL HIGH (ref 70–99)
Glucose, Bld: 167 mg/dL — ABNORMAL HIGH (ref 70–99)
Potassium: 2.6 mmol/L — CL (ref 3.5–5.1)
Potassium: 2.8 mmol/L — ABNORMAL LOW (ref 3.5–5.1)
Sodium: 140 mmol/L (ref 135–145)
Sodium: 141 mmol/L (ref 135–145)

## 2022-12-27 LAB — POCT I-STAT 7, (LYTES, BLD GAS, ICA,H+H)
Acid-base deficit: 14 mmol/L — ABNORMAL HIGH (ref 0.0–2.0)
Acid-base deficit: 11 mmol/L — ABNORMAL HIGH (ref 0.0–2.0)
Bicarbonate: 11.2 mmol/L — ABNORMAL LOW (ref 20.0–28.0)
Bicarbonate: 12.9 mmol/L — ABNORMAL LOW (ref 20.0–28.0)
Calcium, Ion: 1.23 mmol/L (ref 1.15–1.40)
Calcium, Ion: 1.1 mmol/L — ABNORMAL LOW (ref 1.15–1.40)
HCT: 33 % — ABNORMAL LOW (ref 39.0–52.0)
HCT: 32 % — ABNORMAL LOW (ref 39.0–52.0)
Hemoglobin: 11.2 g/dL — ABNORMAL LOW (ref 13.0–17.0)
Hemoglobin: 10.9 g/dL — ABNORMAL LOW (ref 13.0–17.0)
O2 Saturation: 98 %
O2 Saturation: 99 %
Patient temperature: 96.4
Patient temperature: 99.2
Potassium: 2.9 mmol/L — ABNORMAL LOW (ref 3.5–5.1)
Potassium: 2.6 mmol/L — CL (ref 3.5–5.1)
Sodium: 145 mmol/L (ref 135–145)
Sodium: 144 mmol/L (ref 135–145)
TCO2: 12 mmol/L — ABNORMAL LOW (ref 22–32)
TCO2: 14 mmol/L — ABNORMAL LOW (ref 22–32)
pCO2 arterial: 22.8 mmHg — ABNORMAL LOW (ref 32–48)
pCO2 arterial: 24.6 mmHg — ABNORMAL LOW (ref 32–48)
pH, Arterial: 7.293 — ABNORMAL LOW (ref 7.35–7.45)
pH, Arterial: 7.33 — ABNORMAL LOW (ref 7.35–7.45)
pO2, Arterial: 102 mmHg (ref 83–108)
pO2, Arterial: 138 mmHg — ABNORMAL HIGH (ref 83–108)

## 2022-12-27 LAB — CBC
HCT: 36.6 % — ABNORMAL LOW (ref 39.0–52.0)
Hemoglobin: 13.4 g/dL (ref 13.0–17.0)
MCH: 31.2 pg (ref 26.0–34.0)
MCHC: 36.6 g/dL — ABNORMAL HIGH (ref 30.0–36.0)
MCV: 85.3 fL (ref 80.0–100.0)
Platelets: 168 10*3/uL (ref 150–400)
RBC: 4.29 MIL/uL (ref 4.22–5.81)
RDW: 14.4 % (ref 11.5–15.5)
WBC: 17 10*3/uL — ABNORMAL HIGH (ref 4.0–10.5)
nRBC: 0.1 % (ref 0.0–0.2)

## 2022-12-27 LAB — PROTIME-INR
INR: 2.4 — ABNORMAL HIGH (ref 0.8–1.2)
Prothrombin Time: 26.7 seconds — ABNORMAL HIGH (ref 11.4–15.2)

## 2022-12-27 LAB — ECHOCARDIOGRAM COMPLETE
AR max vel: 3.25 cm2
AV Peak grad: 8.5 mmHg
Ao pk vel: 1.46 m/s
Area-P 1/2: 3.03 cm2
Height: 68 in
MV M vel: 2.15 m/s
MV Peak grad: 18.5 mmHg
S' Lateral: 3.5 cm
Weight: 3213.42 oz

## 2022-12-27 LAB — CULTURE, BLOOD (ROUTINE X 2)
Culture: NO GROWTH
Special Requests: ADEQUATE

## 2022-12-27 LAB — RENAL FUNCTION PANEL
Albumin: 2.7 g/dL — ABNORMAL LOW (ref 3.5–5.0)
Anion gap: 11 (ref 5–15)
BUN: 38 mg/dL — ABNORMAL HIGH (ref 6–20)
CO2: 12 mmol/L — ABNORMAL LOW (ref 22–32)
Calcium: 8.3 mg/dL — ABNORMAL LOW (ref 8.9–10.3)
Chloride: 117 mmol/L — ABNORMAL HIGH (ref 98–111)
Creatinine, Ser: 3.93 mg/dL — ABNORMAL HIGH (ref 0.61–1.24)
GFR, Estimated: 17 mL/min — ABNORMAL LOW (ref >60)
Glucose, Bld: 161 mg/dL — ABNORMAL HIGH (ref 70–99)
Phosphorus: 2 mg/dL — ABNORMAL LOW (ref 2.5–4.6)
Potassium: 2.6 mmol/L — CL (ref 3.5–5.1)
Sodium: 140 mmol/L (ref 135–145)

## 2022-12-27 LAB — GLUCOSE, CAPILLARY
Glucose-Capillary: 139 mg/dL — ABNORMAL HIGH (ref 70–99)
Glucose-Capillary: 119 mg/dL — ABNORMAL HIGH (ref 70–99)
Glucose-Capillary: 138 mg/dL — ABNORMAL HIGH (ref 70–99)
Glucose-Capillary: 153 mg/dL — ABNORMAL HIGH (ref 70–99)
Glucose-Capillary: 147 mg/dL — ABNORMAL HIGH (ref 70–99)

## 2022-12-27 LAB — AMMONIA: Ammonia: 176 umol/L — ABNORMAL HIGH (ref 9–35)

## 2022-12-27 LAB — APTT: aPTT: 80 seconds — ABNORMAL HIGH (ref 24–36)

## 2022-12-27 MED ORDER — POTASSIUM CHLORIDE 20 MEQ PO PACK
20.0000 meq | PACK | Freq: Once | ORAL | Status: AC
  Administered 2022-12-27: 20 meq
  Filled 2022-12-27: qty 1

## 2022-12-27 MED ORDER — ALBUMIN HUMAN 5 % IV SOLN
50.0000 g | Freq: Four times a day (QID) | INTRAVENOUS | Status: AC
  Administered 2022-12-27 (×2): 50 g via INTRAVENOUS
  Filled 2022-12-27 (×2): qty 1000

## 2022-12-27 MED ORDER — ALBUMIN HUMAN 5 % IV SOLN: 50.0000 g | Freq: Four times a day (QID) | INTRAVENOUS | Status: DC

## 2022-12-27 MED ORDER — LACTULOSE 10 GM/15ML PO SOLN
30.0000 g | ORAL | Status: DC
  Administered 2022-12-27 (×3): 30 g
  Filled 2022-12-27 (×3): qty 45

## 2022-12-27 MED ORDER — VANCOMYCIN HCL 1750 MG/350ML IV SOLN
1750.0000 mg | Freq: Once | INTRAVENOUS | Status: AC
  Administered 2022-12-27: 1750 mg via INTRAVENOUS
  Filled 2022-12-27: qty 350

## 2022-12-27 MED ORDER — NOREPINEPHRINE 4 MG/250ML-% IV SOLN
0.0000 ug/min | INTRAVENOUS | Status: DC
  Administered 2022-12-27: 18 ug/min via INTRAVENOUS
  Administered 2022-12-27: 10 ug/min via INTRAVENOUS
  Administered 2022-12-27: 13 ug/min via INTRAVENOUS
  Filled 2022-12-27: qty 500
  Filled 2022-12-27 (×2): qty 250

## 2022-12-27 MED ORDER — PERFLUTREN LIPID MICROSPHERE
1.0000 mL | INTRAVENOUS | Status: AC | PRN
  Administered 2022-12-27: 2 mL via INTRAVENOUS

## 2022-12-27 MED ORDER — POTASSIUM CHLORIDE 10 MEQ/100ML IV SOLN
10.0000 meq | INTRAVENOUS | Status: DC
  Administered 2022-12-27 (×5): 10 meq via INTRAVENOUS
  Filled 2022-12-27 (×5): qty 100

## 2022-12-27 MED ORDER — SODIUM CHLORIDE 0.9 % IV SOLN
100.0000 mg | Freq: Two times a day (BID) | INTRAVENOUS | Status: DC
  Administered 2022-12-27: 100 mg via INTRAVENOUS
  Filled 2022-12-27 (×2): qty 100

## 2022-12-27 MED ORDER — LACTATED RINGERS IV BOLUS
1000.0000 mL | Freq: Once | INTRAVENOUS | Status: AC
  Administered 2022-12-27: 1000 mL via INTRAVENOUS

## 2022-12-27 MED ORDER — VANCOMYCIN VARIABLE DOSE PER UNSTABLE RENAL FUNCTION (PHARMACIST DOSING): Status: DC

## 2022-12-27 MED ORDER — SODIUM CHLORIDE 0.9 % IV SOLN
2.0000 g | INTRAVENOUS | Status: DC
  Administered 2022-12-27: 2 g via INTRAVENOUS
  Filled 2022-12-27: qty 12.5

## 2022-12-27 MED ORDER — MIDODRINE HCL 5 MG PO TABS
10.0000 mg | ORAL_TABLET | Freq: Three times a day (TID) | ORAL | Status: DC
  Administered 2022-12-27 (×3): 10 mg
  Filled 2022-12-27 (×3): qty 2

## 2022-12-27 MED ORDER — ALBUMIN HUMAN 5 % IV SOLN
25.0000 g | Freq: Once | INTRAVENOUS | Status: AC
  Administered 2022-12-27: 25 g via INTRAVENOUS
  Filled 2022-12-27: qty 500

## 2022-12-27 MED ORDER — POTASSIUM PHOSPHATES 15 MMOLE/5ML IV SOLN
30.0000 mmol | Freq: Once | INTRAVENOUS | Status: AC
  Administered 2022-12-27: 30 mmol via INTRAVENOUS
  Filled 2022-12-27: qty 10

## 2022-12-27 MED ORDER — POTASSIUM CHLORIDE 20 MEQ PO PACK
40.0000 meq | PACK | Freq: Once | ORAL | Status: AC
  Administered 2022-12-27: 40 meq
  Filled 2022-12-27: qty 2

## 2022-12-27 MED ORDER — VASOPRESSIN 20 UNITS/100 ML INFUSION FOR SHOCK
0.0000 [IU]/min | INTRAVENOUS | Status: DC
  Administered 2022-12-27: 0.03 [IU]/min via INTRAVENOUS
  Filled 2022-12-27: qty 100

## 2022-12-27 MED ORDER — METRONIDAZOLE 500 MG/100ML IV SOLN
500.0000 mg | Freq: Two times a day (BID) | INTRAVENOUS | Status: DC
  Administered 2022-12-27: 500 mg via INTRAVENOUS
  Filled 2022-12-27: qty 100

## 2022-12-27 MED ORDER — SODIUM CHLORIDE 0.9 % IV SOLN: INTRAVENOUS | Status: DC | PRN

## 2022-12-27 MED ORDER — POTASSIUM CHLORIDE 20 MEQ PO PACK
40.0000 meq | PACK | ORAL | Status: DC
  Administered 2022-12-27 (×2): 40 meq
  Filled 2022-12-27 (×2): qty 2

## 2022-12-27 NOTE — Progress Notes (Signed)
eLink Physician-Brief Progress Note Patient Name: Benjamin Knight DOB: 07-08-1965 MRN: [id number]   Date of Service  12/27/2022  HPI/Events of Note  57 year old male with liver cirrhosis and hepatic encephalopathy on lactulose.  He had multiple bowel movements during dayshift.  He was just bathed and subsequently dropped his blood pressure to 73/51.  He is maxed out on peripheral Levophed.  eICU Interventions  Patient examined on camera.  Case discussed with bedside nurse.  Will give 1 L of lactated Ringer solution as well as 25 g of 5% albumin to improve blood pressure symptoms patient lost a lot of volume from diarrhea during dayshift.     Intervention Category Major Interventions: Hypotension - evaluation and management  Carilyn Goodpasture 12/27/2022, 5:13 AM

## 2022-12-27 NOTE — Progress Notes (Signed)
Echocardiogram 2D Echocardiogram has been performed.  Benjamin Knight 12/27/2022, 4:08 PM

## 2022-12-27 NOTE — Progress Notes (Addendum)
PCCM Update:  Bedside US did not indicate ascites for diagnostic paracentesis.  Spoke with [facility] line and Dr. Waynard Reeds of [facility] team who agrees to transfer for further assessment and management.   His insurance will be checked whether he has transplant coverage.   Antibiotics changed to cefepime, vancomycin and flagyl.   Continue rifaximin and lactulose for hepatic encephalopathy.   Afternoon CMP shows K 2.3, Cr 4.61 with 50mL UOP in 10hr.   Give IV of Kcl and of Kcl per tube q4hrs  CC time  Melody Comas, MD [facility] Office: [phone]   See Amion for personal pager PCCM on call pager [phone] until 7pm. Please call Elink 7p-7a. [phone]

## 2022-12-27 NOTE — Discharge Summary (Signed)
Physician Discharge Summary         Patient ID: Benjamin Knight MRN: [id number] DOB/AGE: 58-Oct-1967 58 y.o.  Admit date: 12/25/2022 Discharge date: 12/27/2022  Discharge Diagnoses:    Active Hospital Problems   Diagnosis Date Noted   Acute metabolic encephalopathy 12/26/2022   Upper GI bleed 12/26/2022   Portal hypertensive gastropathy (HCC) 12/26/2022   History of celiac disease 12/26/2022   History of bipolar disorder 12/26/2022   Chronic alcohol use 12/26/2022   AKI (acute kidney injury) (HCC) 12/26/2022   Hypokalemia 12/26/2022   Acute encephalopathy 12/26/2022   Coffee ground emesis 12/26/2022   Heme positive stool 12/26/2022   Decompensated hepatic cirrhosis (HCC) 05/03/2021   Monoclonal gammopathy 05/03/2021   Hepatic encephalopathy (HCC) 04/01/2021   AMS (altered mental status) 03/30/2021    Resolved Hospital Problems  No resolved problems to display.      Discharge summary   57 yo male with pmh noted below presented this evening after 2-3 days of worsening mentation. Wife is at bedside and provides most the history. Pt was in his normal state of health until about 2-3 days ago when he began having some "off" behaviors. He would forget things, get agitated for example. She inquired about his medications this weekend and he stated he had not taken his lactulose for >3weeks now as he felt he did not need it any longer. She was able to convince him to take a dose Sunday and Monday but only one. Today when he got up from a nap he could barely stand and walk. He was agitated but disoriented. She ran to get gas in her car and then returned within 10 mins to find broken items and him vomiting with some blood in it. She was able to get him into the car but he remained very confused.    When pt arrived he was noted to be somnolent but arousable. He is slightly tachypneic likely 2/2 metabolic acidosis. He was intolerant taking oral and rectal lactulose order.    He was  admitted to the ICU and developed worsening respiratory failure requiring intubation along with pressor requirement and renal failure.  He was accepted to [facility] for evaluation for liver transplant      Discharge Plan by Active Problems    Shock  - likely septic but unknown source, possibly SBP - continue vasopressor support for MAP 75 or greater - was on ceftriaxone and doxycycline for antibiotics, broadened Cefepime, Vanc, and Flagyl - US abdomen did not reveal enough ascites for paracentesis    Acute Hypoxemic Respiratory Failure - continue mechanical ventilatory support - sedation ptn   Hepatic Encephalopathy Decompensated Liver Cirrhosis - increase lactulose to q4hrs - GI following -transfer to [facility]    Acute Kidney Injury Possible Hepatorenal syndrome Hypokalemia Metabolic Acidosis - Maintain MAP 75 or greater - start albumin 50g q 8 hrs x 3 doses - continue bicarb drip - replete potassium - start midodrine 10mg  TID     Significant Hospital tests/ studies   CT abd/pelvis:  IMPRESSION: 1. Fluid throughout the entire colon, can be seen with diarrheal illness. No definite associated colonic wall thickening or inflammation, allowing for motion artifact limitations. 2. Hepatic cirrhosis. 3. Punctate nonobstructing right renal stone. 4. No acute findings in the thorax allowing for motion artifact limitations. 5. Coronary artery calcifications and aortic atherosclerosis.   Procedures     6/27 L IF CVC 6/26 Intubation  Culture data/antimicrobials    6/26 Blood cultures-NGTD  Consults   Gastroenterology     Discharge Exam: BP 103/63   Pulse (!) 53   Temp 99.2 F (37.3 C) (Oral)   Resp (!) 24   Ht 5' 7.99" (1.727 m)   Wt 91.1 kg   SpO2 100%   BMI 30.54 kg/m    General:  critically ill male, intubated and sedated HEENT: MM pink/moist Neuro: examined on sedation RASS -3 CV: s1s2 rrr, no m/r/g PULM:  synchronous with vent, equal chest  rise Extremities: warm/dry, no edema  Skin: no rashes or lesions   Labs at discharge   Lab Results  Component Value Date   CREATININE 4.61 (H) 12/27/2022   BUN 41 (H) 12/27/2022   NA 144 12/27/2022   K 2.6 (LL) 12/27/2022   CL 113 (H) 12/27/2022   CO2 13 (L) 12/27/2022   Lab Results  Component Value Date   WBC 17.0 (H) 12/27/2022   HGB 10.9 (L) 12/27/2022   HCT 32.0 (L) 12/27/2022   MCV 85.3 12/27/2022   PLT 168 12/27/2022   Lab Results  Component Value Date   ALT 15 12/27/2022   AST 41 12/27/2022   ALKPHOS 138 (H) 12/27/2022   BILITOT 4.2 (H) 12/27/2022   Lab Results  Component Value Date   INR 2.4 (H) 12/27/2022   INR 2.1 (H) 12/26/2022   INR 2.9 (H) 04/02/2021    Current radiological studies    ECHOCARDIOGRAM COMPLETE  Result Date: 12/27/2022    ECHOCARDIOGRAM REPORT   Patient Name:   Benjamin Knight Date of Exam: 12/27/2022 Medical Rec #:  [id number]        Height:       68.0 in Accession #:    [id number]       Weight:       200.8 lb Date of Birth:  September 29, 1965        BSA:          2.047 m Patient Age:    57 years         BP:           80/57 mmHg Patient Gender: M                HR:           59 bpm. Exam Location:  Inpatient Procedure: 2D Echo, Cardiac Doppler, Color Doppler and Intracardiac            Opacification Agent Indications:    Shock R57.9  History:        Patient has prior history of Echocardiogram examinations, most                 recent 03/30/2021. Risk Factors:Sleep Apnea.  Sonographer:    Lucendia Herrlich Referring Phys: [id number] JONATHAN B DEWALD IMPRESSIONS  1. Left ventricular ejection fraction, by estimation, is 55 to 60%. The left ventricle has normal function. The left ventricle has no regional wall motion abnormalities. Left ventricular diastolic parameters were normal.  2. Right ventricular systolic function is mildly reduced. The right ventricular size is mildly enlarged. There is normal pulmonary artery systolic pressure. The estimated right  ventricular systolic pressure is 31.8 mmHg.  3. Left atrial size was mildly dilated.  4. Right atrial size was mild to moderately dilated.  5. The mitral valve is normal in structure. Trivial mitral valve regurgitation. No evidence of mitral stenosis.  6. The aortic valve is normal in structure. Aortic valve regurgitation is not visualized. No aortic stenosis is present.  7. The inferior vena cava is dilated in size with <50% respiratory variability, suggesting right atrial pressure of 15 mmHg. FINDINGS  Left Ventricle: Left ventricular ejection fraction, by estimation, is 55 to 60%. The left ventricle has normal function. The left ventricle has no regional wall motion abnormalities. Definity contrast agent was given IV to delineate the left ventricular  endocardial borders. The left ventricular internal cavity size was normal in size. There is no left ventricular hypertrophy. Left ventricular diastolic parameters were normal. Right Ventricle: The right ventricular size is mildly enlarged. No increase in right ventricular wall thickness. Right ventricular systolic function is mildly reduced. There is normal pulmonary artery systolic pressure. The tricuspid regurgitant velocity  is 2.05 m/s, and with an assumed right atrial pressure of 15 mmHg, the estimated right ventricular systolic pressure is 31.8 mmHg. Left Atrium: Left atrial size was mildly dilated. Right Atrium: Right atrial size was mild to moderately dilated. Pericardium: There is no evidence of pericardial effusion. Mitral Valve: The mitral valve is normal in structure. Trivial mitral valve regurgitation. No evidence of mitral valve stenosis. Tricuspid Valve: The tricuspid valve is normal in structure. Tricuspid valve regurgitation is mild . No evidence of tricuspid stenosis. Aortic Valve: The aortic valve is normal in structure. Aortic valve regurgitation is not visualized. No aortic stenosis is present. Aortic valve peak gradient measures 8.5 mmHg.  Pulmonic Valve: The pulmonic valve was normal in structure. Pulmonic valve regurgitation is not visualized. No evidence of pulmonic stenosis. Aorta: The aortic root is normal in size and structure. Venous: The inferior vena cava is dilated in size with less than 50% respiratory variability, suggesting right atrial pressure of 15 mmHg. IAS/Shunts: No atrial level shunt detected by color flow Doppler.  LEFT VENTRICLE PLAX 2D LVIDd:         5.50 cm   Diastology LVIDs:         3.50 cm   LV e' medial:    9.86 cm/s LV PW:         1.10 cm   LV E/e' medial:  9.4 LV IVS:        0.90 cm   LV e' lateral:   12.80 cm/s LVOT diam:     2.10 cm   LV E/e' lateral: 7.2 LV SV:         92 LV SV Index:   45 LVOT Area:     3.46 cm  RIGHT VENTRICLE             IVC RV S prime:     12.30 cm/s  IVC diam: 2.90 cm TAPSE (M-mode): 2.7 cm LEFT ATRIUM             Index        RIGHT ATRIUM           Index LA diam:        4.30 cm 2.10 cm/m   RA Area:     21.80 cm LA Vol (A2C):   43.2 ml 21.10 ml/m  RA Volume:   65.40 ml  31.94 ml/m LA Vol (A4C):   49.8 ml 24.32 ml/m LA Biplane Vol: 49.8 ml 24.32 ml/m  AORTIC VALVE AV Area (Vmax): 3.25 cm AV Vmax:        146.00 cm/s AV Peak Grad:   8.5 mmHg LVOT Vmax:      137.00 cm/s LVOT Vmean:     87.167 cm/s LVOT VTI:       0.264 m  AORTA Ao Root diam:  3.60 cm Ao Asc diam:  3.50 cm MITRAL VALVE               TRICUSPID VALVE MV Area (PHT): 3.03 cm    TR Peak grad:   16.8 mmHg MV Decel Time: 250 msec    TR Vmax:        205.00 cm/s MR Peak grad: 18.5 mmHg MR Vmax:      215.00 cm/s  SHUNTS MV E velocity: 92.20 cm/s  Systemic VTI:  0.26 m MV A velocity: 77.60 cm/s  Systemic Diam: 2.10 cm MV E/A ratio:  1.19 Arvilla Meres MD Electronically signed by Arvilla Meres MD Signature Date/Time: 12/27/2022/6:04:10 PM    Final    DG CHEST PORT 1 VIEW  Result Date: 12/27/2022 CLINICAL DATA:  Central venous catheter placement EXAM: PORTABLE CHEST 1 VIEW COMPARISON:  Previous studies including the examination  down on 12/26/2022 FINDINGS: Transverse diameter of heart is increased. Low lung volumes. Increased markings are seen in lower lung fields with interval improvement in left lung. There is improvement in aeration in left upper lung field. Tip of endotracheal tube is 3.1 cm above the carina. NG tube is noted traversing the esophagus. There is interval placement of left IJ central venous catheter with its tip in the course of right subclavian. There is no pleural effusion or pneumothorax. IMPRESSION: There is interval placement of left IJ central venous catheter with its tip in the course of right subclavian vein. These results will be called to the ordering clinician or representative by the Radiologist Assistant, and communication documented in the PACS or [employer]. There is no pleural effusion or pneumothorax. Cardiomegaly. Crowding of markings in the lower lung fields may be due to poor inspiration or suggest atelectasis/pneumonia. Support devices as described above. Electronically Signed   By: Ernie Avena M.D.   On: 12/27/2022 11:13   DG CHEST PORT 1 VIEW  Result Date: 12/26/2022 CLINICAL DATA:  Intubated EXAM: PORTABLE CHEST 1 VIEW COMPARISON:  12/25/2022 FINDINGS: Endotracheal tube tip 3 cm above the carina. Orogastric or nasogastric tube enters the stomach. Patchy density persists at the lung bases, left more than right, possibly slightly worsened. This could be due to atelectasis or bronchopneumonia. IMPRESSION: 1. Endotracheal tube tip 3 cm above the carina. 2. Patchy density at the lung bases, left more than right, possibly slightly worsened. Electronically Signed   By: Paulina Fusi M.D.   On: 12/26/2022 08:26   DG Abd Portable 1 View  Result Date: 12/26/2022 CLINICAL DATA:  Nasogastric tube placement. EXAM: PORTABLE ABDOMEN - 1 VIEW COMPARISON:  None Available. FINDINGS: A nasogastric tube is seen with its distal tip noted within the body of the stomach. The distal side hole sits  approximally 4.4 cm distal to the expected region of the gastroesophageal junction. The bowel gas pattern is normal. No radio-opaque calculi or other significant radiographic abnormality are seen. IMPRESSION: Nasogastric tube positioning, as described above. Electronically Signed   By: Aram Candela M.D.   On: 12/26/2022 02:18   CT CHEST ABDOMEN PELVIS WO CONTRAST  Result Date: 12/25/2022 CLINICAL DATA:  Abdominal pain. History of cirrhosis and hepatic encephalopathy. EXAM: CT CHEST, ABDOMEN AND PELVIS WITHOUT CONTRAST TECHNIQUE: Multidetector CT imaging of the chest, abdomen and pelvis was performed following the standard protocol without IV contrast. RADIATION DOSE REDUCTION: This exam was performed according to the departmental dose-optimization program which includes automated exposure control, adjustment of the mA and/or kV according to patient size and/or use of iterative  reconstruction technique. COMPARISON:  None Available. FINDINGS: CT CHEST FINDINGS Cardiovascular: Borderline cardiomegaly. No pericardial effusion. Occasional coronary artery calcifications. The thoracic aorta is normal in caliber. Motion artifact through the chest limits assessment. Mediastinum/Nodes: No gross mediastinal adenopathy, allowing for motion artifact limitations. No esophageal wall thickening, the upper esophagus is patulous. Lungs/Pleura: Breathing motion artifact limits parenchymal assessment, particularly in the lower lobes. Allowing for this no evidence of focal airspace disease or pleural effusion. More detailed assessment is limited. Musculoskeletal: Thoracic spondylosis with anterior spurring. There are no acute or suspicious osseous abnormalities. Bilateral gynecomastia. CT ABDOMEN PELVIS FINDINGS Hepatobiliary: Nodular hepatic contours typical of cirrhosis. Motion artifact through the liver limits detailed assessment. Fluid density structure in the central dome measures 2.7 cm, likely cyst. Gallbladder is  tentatively visualized and normal, although motion obscured. Pancreas: No gross pancreatic inflammation. Spleen: Upper normal in size, 12.3 cm greatest AP dimension. Adrenals/Urinary Tract: No adrenal nodule. No hydronephrosis. Punctate nonobstructing stone in the lower pole of the right kidney. Urinary bladder is only minimally distended. Wall thickening is greater than expected for degree of distension. Stomach/Bowel: The stomach is distended with ingested material. Small bowel is decompressed. Fluid throughout the entire colon. No definite associated colonic wall thickening or inflammation, allowing for motion artifact limitations. The appendix is not confidently visualized on the current exam. Vascular/Lymphatic: Aortic and branch atherosclerosis. No aneurysm. Borderline retroperitoneal left periaortic node measuring 13 mm series 3, image 86. Reproductive: Prostate is unremarkable. Other: No significant ascites.  No free intra-abdominal air. Musculoskeletal: Lumbar spondylosis with anterior spurring. There are no acute or suspicious osseous abnormalities. IMPRESSION: 1. Fluid throughout the entire colon, can be seen with diarrheal illness. No definite associated colonic wall thickening or inflammation, allowing for motion artifact limitations. 2. Hepatic cirrhosis. 3. Punctate nonobstructing right renal stone. 4. No acute findings in the thorax allowing for motion artifact limitations. 5. Coronary artery calcifications and aortic atherosclerosis. Aortic Atherosclerosis (ICD10-I70.0). Electronically Signed   By: Narda Rutherford M.D.   On: 12/25/2022 23:17   CT Head Wo Contrast  Result Date: 12/25/2022 CLINICAL DATA:  Altered mental status. EXAM: CT HEAD WITHOUT CONTRAST TECHNIQUE: Contiguous axial images were obtained from the base of the skull through the vertex without intravenous contrast. RADIATION DOSE REDUCTION: This exam was performed according to the departmental dose-optimization program which  includes automated exposure control, adjustment of the mA and/or kV according to patient size and/or use of iterative reconstruction technique. COMPARISON:  Head CT dated 03/30/2021. FINDINGS: Evaluation of this exam is limited due to motion artifact. Brain: The ventricles and sulci are appropriate size for the patient's age. The gray-white matter discrimination is preserved. There is no acute intracranial hemorrhage. No mass effect or midline shift. No extra-axial fluid collection. Vascular: No hyperdense vessel or unexpected calcification. Skull: Normal. Negative for fracture or focal lesion. Sinuses/Orbits: No acute finding. Other: None IMPRESSION: No acute intracranial pathology. Electronically Signed   By: Elgie Collard M.D.   On: 12/25/2022 23:13   DG Chest Portable 1 View  Result Date: 12/25/2022 CLINICAL DATA:  Worsening hepatic encephalopathy. EXAM: PORTABLE CHEST 1 VIEW COMPARISON:  March 30, 2021 FINDINGS: The heart size and mediastinal contours are within normal limits. Low lung volumes are seen with subsequent crowding of the bronchovascular lung markings. There is no evidence of an acute infiltrate, pleural effusion or pneumothorax. The visualized skeletal structures are unremarkable. IMPRESSION: Low lung volumes without acute or active cardiopulmonary disease. Electronically Signed   By: Demetrius Revel.D.  On: 12/25/2022 22:53    Disposition:    [facility]    Allergies as of 12/27/2022       Reactions   Barley Grass Other (See Comments)   Celiac   Other Other (See Comments)   Wheat Barley [place], Celiac Disease   Sertraline Other (See Comments)   Tremors, "felt weird"   Wheat Other (See Comments)   Celiac disease        Medication List     TAKE these medications    ferrous sulfate 325 (65 FE) MG tablet TAKE 1 TABLET (325 MG TOTAL) BY MOUTH DAILY WITH BREAKFAST. PLEASE TAKE WITH A SOURCE OF VITAMIN C   folic acid 1 MG tablet Commonly known as:  FOLVITE Take 1 tablet (1 mg total) by mouth daily.   furosemide 20 MG tablet Commonly known as: LASIX Take 20 mg by mouth daily.   lactulose 10 GM/15ML solution Commonly known as: CHRONULAC Take 45 mLs (30 g total) by mouth 3 (three) times daily. Titrate to a goal of 2-3 bowel movements per day.   MILK THISTLE PO Take 1 capsule by mouth daily.   pantoprazole 40 MG tablet Commonly known as: PROTONIX Take 1 tablet (40 mg total) by mouth daily.   Pepcid 20 MG tablet Generic drug: famotidine Take 10-20 mg by mouth 2 (two) times daily as needed for heartburn or indigestion.   spironolactone 50 MG tablet Commonly known as: ALDACTONE Take 1 tablet (50 mg total) by mouth daily.   thiamine 100 MG tablet Commonly known as: VITAMIN B1 Take 1 tablet (100 mg total) by mouth daily.         Follow-up appointment    Discharge Condition:    stable   Signed: Darcella Gasman Anaiyah Anglemyer 12/27/2022, 10:04 PM

## 2022-12-27 NOTE — Procedures (Signed)
Arterial Catheter Insertion Procedure Note  Benjamin Knight  [id number]  09-Oct-1965  Date:12/27/22  Time:9:22 AM    Provider Performing: Reatha Harps    Procedure: Insertion of Arterial Line ([id number]) without US guidance  Indication(s) Blood pressure monitoring and/or need for frequent ABGs  Consent Unable to obtain consent due to emergent nature of procedure.  Anesthesia None   Time Out Verified patient identification, verified procedure, site/side was marked, verified correct patient position, special equipment/implants available, medications/allergies/relevant history reviewed, required imaging and test results available.   Sterile Technique Maximal sterile technique including full sterile barrier drape, hand hygiene, sterile gown, sterile gloves, mask, hair covering, sterile ultrasound probe cover (if used).   Procedure Description Area of catheter insertion was cleaned with chlorhexidine and draped in sterile fashion. Without real-time ultrasound guidance an arterial catheter was placed into the right radial artery.  Appropriate arterial tracings confirmed on monitor.     Complications/Tolerance None; patient tolerated the procedure well.   EBL Minimal   Specimen(s) None

## 2022-12-27 NOTE — Progress Notes (Addendum)
eLink Physician-Brief Progress Note Patient Name: LIEF PALMATIER DOB: 10-08-1965 MRN: [id number]   Date of Service  12/27/2022  HPI/Events of Note  Patient with profound hypokalemia and despite receiving 120 mEq/day replacement.  Most recent K is 2.8.  Next dose of potassium not due for another 9 hours.  Creatinine is elevated at 3.49.  eICU Interventions  Will give a one-time dose of 20 mEq p.o. potassium was increased from admission level of 2.2 to currently 2.8.  Addendum: Potassium level remains 2.6 despite patient receiving 20 mEq earlier.  Will give 40 mill equivalent x 1 now.      Intervention Category Major Interventions: Electrolyte abnormality - evaluation and management  Carilyn Goodpasture 12/27/2022, 1:22 AM

## 2022-12-27 NOTE — Progress Notes (Signed)
Pharmacy Antibiotic Note  Benjamin Knight is a 57 y.o. male admitted on 12/25/2022 with sepsis.  Pharmacy has been consulted for cefepime dosing.  Pt with decompensated cirrhosis who has been on ceftriaxone/doxy for SBP/PNA. Consulted to change ceftriaxone to cefepime tonight.  Scr now at 3.93  Plan: Cefepime 2g IV q24  Height: 5\' 8"  (172.7 cm) Weight: 91.1 kg (200 lb 13.4 oz) IBW/kg (Calculated) : 68.4  Temp (24hrs), Avg:97.3 F (36.3 C), Min:96.3 F (35.7 C), Max:99.2 F (37.3 C)  Recent Labs  Lab 12/25/22 2114 12/25/22 2215 12/26/22 0820 12/26/22 0926 12/26/22 0933 12/26/22 1116 12/26/22 2341 12/27/22 0104 12/27/22 0438  WBC 18.7*  --  19.7*  --   --   --   --  17.0*  --   CREATININE 2.51*   < >  --  2.40* 2.41*  --  3.49* 3.65* 3.93*  LATICACIDVEN  --   --  2.3*  --   --  1.9  --   --   --    < > = values in this interval not displayed.    Estimated Creatinine Clearance: 22.5 mL/min (A) (by C-G formula based on SCr of 3.93 mg/dL (H)).    Allergies  Allergen Reactions   Barley Grass Other (See Comments)    Celiac   Other Other (See Comments)    Wheat Barley Rye, Celiac Disease   Sertraline Other (See Comments)    Tremors, "felt weird"   Wheat Other (See Comments)    Celiac disease    Antimicrobials this admission: CTX 6/26> (7/3) Doxy 6/27 >>6/27 Cefepime 6/27>>  Dose adjustments this admission:   Microbiology results: 6/26 Bcx: NGTD 6/26 Bcx: NGTD  [name], PharmD, BCIDP, AAHIVP, CPP Infectious Disease Pharmacist 12/27/2022 4:57 PM

## 2022-12-27 NOTE — Progress Notes (Signed)
Pharmacy Brief Antibiotic Note - Vancomycin  Adding Vancomycin to antibiotic regimen for sepsis. Give 1750mg  IV x 1 (~20mg /kg). Given worsening AKI, plan further dosing based on levels.  Loralee Pacas, PharmD, BCPS 12/27/2022 5:38 PM  Please check AMION for all [facility] Pharmacy phone numbers After 10:00 PM, call Main Pharmacy [phone]

## 2022-12-27 NOTE — Progress Notes (Addendum)
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   Recommendations   1.  Decompensated cirrhosis/profound encephalopathy and now acute kidney injury --unclear what precipitated his significant hepatic decompensation.  He made the decision to stop his lactulose but 2 weeks before presentation.  The wife states that he went down extremely quickly.  He has been intermittently treated for skin ulcers/cellulitis versus stasis dermatitis with antibiotics though not in the last week prior to admission.  There is been no demonstrable infectious process to this point though he has been on ceftriaxone.  There was no appreciable ascites by CT scan when he presented and so diagnostic paracentesis was not performed.  He remains critically ill with worsening renal injury. -- Continue lactulose per OG; currently 3 times a day will make every 6 hours given slowed BMs in the last 12 hours -- Rifaximin twice daily when able -- Initial head CT 48 hours ago negative -- PCCM will perform bedside ultrasound today and diagnostic paracentesis if possible -- He remains on norepinephrine for pressure support -- No evidence for GI bleeding -- No known HCC though CT here was noncontrasted; MRI February 2024 at [facility] -- I will alert Dr. Corky Sing his hepatologist at [facility] of his admission and communicate any additional recommendations from [facility]  2.  AKI --creatinine at [facility] in February was 1.0; on admission creatinine 2.5, today creatinine 3.93, BUN 38. -- Concern for HRS; getting albumin and pressure support -- May eventually need renal consultation  MELD 3.0: 33 at 12/27/2022  9:33  AM MELD-Na: 35 at 12/27/2022  9:33 AM Calculated from: Serum Creatinine: 3.93 mg/dL (Using max of 3 mg/dL) at 1/61/0960  4:54 AM Serum Sodium: 145 mmol/L (Using max of 137 mmol/L) at 12/27/2022  9:33 AM Total Bilirubin: 5.0 mg/dL at 0/98/1191  4:78 AM Serum Albumin: 2.7 g/dL at 2/95/6213  0:86 AM INR(ratio): 2.4 at 12/27/2022  1:04 AM Age at listing (hypothetical): 75 years Sex: Male at 12/27/2022  9:33 AM  We will follow  ADDENDUM: I have been in communication with Esmond Harps, MD the patient's hepatologist at [facility]. They would like him transferred there if not turning around quickly here.  This would potentially allow for quick liver transplant evaluation. Dr. Radford Pax colleague, [place] Donovan, PA-C asked for PEth testing if available here.   Chief Complaint   Patient remains intubated, sedated Rectal tube in place without melenic stool though less liquid stool overnight Wife in waiting area, discussed current situation and plan with her, the patient's brother and their pastor  Vital signs in last 24 hours: Temp:  [96.3 F (35.7 C)-97.6 F (36.4 C)] 96.4 F (35.8 C) (06/27 0739) Pulse Rate:  [47-81] 52 (06/27 1030) Resp:  [14-33] 26 (06/27 1030) BP: (59-104)/(45-80) 88/62 (06/27 1030) SpO2:  [100 %] 100 % (06/27 1030) Arterial Line BP: (83-94)/(47-52) 93/50 (06/27 1030) FiO2 (%):  [40 %] 40 % (06/27 0800) Weight:  [91.1 kg] 91.1 kg (06/27 0500) Last BM Date : 12/26/22 Gen: chronically ill appearing, intubated, obtunded HEENT: icteric  CV: brady, reg Pulm: course anteriorly b/l Abd: soft, mildly distended,  +BS throughout Ext: no c/c, 1+ LE edema Neuro: obtunded   Intake/Output from previous day: 06/26 0701 -  06/27 0700 In: 4831.5 [I.V.:3189.1; IV Piggyback:1642.5] Out: 690 [Urine:190; Stool:500] Intake/Output this shift: Total I/O In: 1042.8 [I.V.:554.7; IV Piggyback:488.1] Out: 5 [Urine:5]  Lab Results: Recent Labs    12/25/22 2114 12/25/22 2215  12/26/22 0820 12/26/22 0850 12/26/22 2241 12/27/22 0104 12/27/22 0933  WBC 18.7*  --  19.7*  --   --  17.0*  --   HGB 16.0   < > 14.5   < > 11.9* 13.4 11.2*  HCT 45.3   < > 41.8   < > 35.0* 36.6* 33.0*  PLT 266  --  189  --   --  168  --    < > = values in this interval not displayed.   BMET Recent Labs    12/26/22 2341 12/27/22 0104 12/27/22 0438 12/27/22 0933  NA 140 141 140 145  K 2.8* 2.6* 2.6* 2.9*  CL 119* 114* 117*  --   CO2 10* 9* 12*  --   GLUCOSE 167* 158* 161*  --   BUN 35* 36* 38*  --   CREATININE 3.49* 3.65* 3.93*  --   CALCIUM 8.5* 8.6* 8.3*  --    LFT Recent Labs    12/26/22 0638 12/27/22 0438  PROT 7.3  --   ALBUMIN 3.3* 2.7*  AST 45*  --   ALT 16  --   ALKPHOS 188*  --   BILITOT 5.0*  --    PT/INR Recent Labs    12/26/22 0820 12/27/22 0104  LABPROT 23.8* 26.7*  INR 2.1* 2.4*   Hepatitis Panel No results for input(s): "HEPBSAG", "HCVAB", "HEPAIGM", "HEPBIGM" in the last 72 hours.  Studies/Results: DG CHEST PORT 1 VIEW  Result Date: 12/26/2022 CLINICAL DATA:  Intubated EXAM: PORTABLE CHEST 1 VIEW COMPARISON:  12/25/2022 FINDINGS: Endotracheal tube tip 3 cm above the carina. Orogastric or nasogastric tube enters the stomach. Patchy density persists at the lung bases, left more than right, possibly slightly worsened. This could be due to atelectasis or bronchopneumonia. IMPRESSION: 1. Endotracheal tube tip 3 cm above the carina. 2. Patchy density at the lung bases, left more than right, possibly slightly worsened. Electronically Signed   By: Paulina Fusi M.D.   On: 12/26/2022 08:26   DG Abd Portable 1 View  Result Date: 12/26/2022 CLINICAL DATA:  Nasogastric tube placement. EXAM: PORTABLE ABDOMEN - 1 VIEW COMPARISON:  None Available. FINDINGS: A nasogastric tube is seen with its distal tip noted within the body of the stomach. The distal side hole sits approximally 4.4 cm distal to the expected region of the gastroesophageal junction. The bowel  gas pattern is normal. No radio-opaque calculi or other significant radiographic abnormality are seen. IMPRESSION: Nasogastric tube positioning, as described above. Electronically Signed   By: Aram Candela M.D.   On: 12/26/2022 02:18   CT CHEST ABDOMEN PELVIS WO CONTRAST  Result Date: 12/25/2022 CLINICAL DATA:  Abdominal pain. History of cirrhosis and hepatic encephalopathy. EXAM: CT CHEST, ABDOMEN AND PELVIS WITHOUT CONTRAST TECHNIQUE: Multidetector CT imaging of the chest, abdomen and pelvis was performed following the standard protocol without IV contrast. RADIATION DOSE REDUCTION: This exam was performed according to the departmental dose-optimization program which includes automated exposure control, adjustment of the mA and/or kV according to patient size and/or use of iterative reconstruction technique. COMPARISON:  None Available. FINDINGS: CT CHEST FINDINGS Cardiovascular: Borderline cardiomegaly. No pericardial effusion. Occasional coronary artery calcifications. The thoracic aorta is normal in caliber. Motion artifact through the chest limits assessment. Mediastinum/Nodes: No gross mediastinal adenopathy,  allowing for motion artifact limitations. No esophageal wall thickening, the upper esophagus is patulous. Lungs/Pleura: Breathing motion artifact limits parenchymal assessment, particularly in the lower lobes. Allowing for this no evidence of focal airspace disease or pleural effusion. More detailed assessment is limited. Musculoskeletal: Thoracic spondylosis with anterior spurring. There are no acute or suspicious osseous abnormalities. Bilateral gynecomastia. CT ABDOMEN PELVIS FINDINGS Hepatobiliary: Nodular hepatic contours typical of cirrhosis. Motion artifact through the liver limits detailed assessment. Fluid density structure in the central dome measures 2.7 cm, likely cyst. Gallbladder is tentatively visualized and normal, although motion obscured. Pancreas: No gross pancreatic  inflammation. Spleen: Upper normal in size, 12.3 cm greatest AP dimension. Adrenals/Urinary Tract: No adrenal nodule. No hydronephrosis. Punctate nonobstructing stone in the lower pole of the right kidney. Urinary bladder is only minimally distended. Wall thickening is greater than expected for degree of distension. Stomach/Bowel: The stomach is distended with ingested material. Small bowel is decompressed. Fluid throughout the entire colon. No definite associated colonic wall thickening or inflammation, allowing for motion artifact limitations. The appendix is not confidently visualized on the current exam. Vascular/Lymphatic: Aortic and branch atherosclerosis. No aneurysm. Borderline retroperitoneal left periaortic node measuring 13 mm series 3, image 86. Reproductive: Prostate is unremarkable. Other: No significant ascites.  No free intra-abdominal air. Musculoskeletal: Lumbar spondylosis with anterior spurring. There are no acute or suspicious osseous abnormalities. IMPRESSION: 1. Fluid throughout the entire colon, can be seen with diarrheal illness. No definite associated colonic wall thickening or inflammation, allowing for motion artifact limitations. 2. Hepatic cirrhosis. 3. Punctate nonobstructing right renal stone. 4. No acute findings in the thorax allowing for motion artifact limitations. 5. Coronary artery calcifications and aortic atherosclerosis. Aortic Atherosclerosis (ICD10-I70.0). Electronically Signed   By: Narda Rutherford M.D.   On: 12/25/2022 23:17   CT Head Wo Contrast  Result Date: 12/25/2022 CLINICAL DATA:  Altered mental status. EXAM: CT HEAD WITHOUT CONTRAST TECHNIQUE: Contiguous axial images were obtained from the base of the skull through the vertex without intravenous contrast. RADIATION DOSE REDUCTION: This exam was performed according to the departmental dose-optimization program which includes automated exposure control, adjustment of the mA and/or kV according to patient size  and/or use of iterative reconstruction technique. COMPARISON:  Head CT dated 03/30/2021. FINDINGS: Evaluation of this exam is limited due to motion artifact. Brain: The ventricles and sulci are appropriate size for the patient's age. The gray-white matter discrimination is preserved. There is no acute intracranial hemorrhage. No mass effect or midline shift. No extra-axial fluid collection. Vascular: No hyperdense vessel or unexpected calcification. Skull: Normal. Negative for fracture or focal lesion. Sinuses/Orbits: No acute finding. Other: None IMPRESSION: No acute intracranial pathology. Electronically Signed   By: Elgie Collard M.D.   On: 12/25/2022 23:13   DG Chest Portable 1 View  Result Date: 12/25/2022 CLINICAL DATA:  Worsening hepatic encephalopathy. EXAM: PORTABLE CHEST 1 VIEW COMPARISON:  March 30, 2021 FINDINGS: The heart size and mediastinal contours are within normal limits. Low lung volumes are seen with subsequent crowding of the bronchovascular lung markings. There is no evidence of an acute infiltrate, pleural effusion or pneumothorax. The visualized skeletal structures are unremarkable. IMPRESSION: Low lung volumes without acute or active cardiopulmonary disease. Electronically Signed   By: Aram Candela M.D.   On: 12/25/2022 22:53      LOS: 1 day   Beverley Fiedler, MD 12/27/2022, 11:02 AM See Loretha Stapler, [facility], to contact our on call provider

## 2022-12-27 NOTE — Progress Notes (Addendum)
Pharmacy Electrolyte Replacement  Recent Labs:  Recent Labs    12/26/22 0638 12/26/22 0820 12/27/22 0438 12/27/22 0933 12/27/22 1604  K 2.2*   < > 2.6*   < > 2.3*  MG 1.7  --   --   --   --   PHOS  --    < > 2.0*  --   --   CREATININE 2.35*   < > 3.93*  --  4.61*   < > = values in this interval not displayed.    Low Critical Values (K </= 2.5, Phos </= 1, Mg </= 1) Present: K = 2.3  MD Contacted: CCM  Plan:   KCL x 6 runs per MD KCL q4 x 3 doses  [name], PharmD, [place], AAHIVP, CPP Infectious Disease Pharmacist 12/27/2022 5:18 PM

## 2022-12-27 NOTE — Progress Notes (Signed)
[facility] at bedside, transporting pt to [facility] [address]. Pt wife notified of transport and new room

## 2022-12-27 NOTE — Progress Notes (Signed)
57 year old male with alcoholic liver cirrhosis, decompensated with pulmonary hypertension, hepatic encephalopathy and esophageal varices who presented with altered mental status, admitted as hepatic encephalopathy. Patient's wife reported that he was not taking lactulose for last 2 weeks. Currently intubated and on Levophed, sodium bicarbonate, and iv antibiotics.  TOC following.

## 2022-12-27 NOTE — Procedures (Addendum)
Central Venous Catheter Insertion Procedure Note  Benjamin Knight  [id number]  29-May-1966  Date:12/27/22  Time:10:11 AM   Provider Performing:Brooke Lodema Hong   Procedure: Insertion of Non-tunneled Central Venous [phone]) with US guidance ([id number])   Indication(s) Medication administration  Consent Risks of the procedure as well as the alternatives and risks of each were explained to the patient and/or caregiver.  Consent for the procedure was obtained and is signed in the bedside chart  Anesthesia Topical only with 1% lidocaine   Timeout Verified patient identification, verified procedure, site/side was marked, verified correct patient position, special equipment/implants available, medications/allergies/relevant history reviewed, required imaging and test results available.  Sterile Technique Maximal sterile technique including full sterile barrier drape, hand hygiene, sterile gown, sterile gloves, mask, hair covering, sterile ultrasound probe cover (if used).  Procedure Description Area of catheter insertion was cleaned with chlorhexidine and draped in sterile fashion.  With real-time ultrasound guidance a central venous catheter was placed into the left internal jugular vein. Nonpulsatile blood flow and easy flushing noted in all ports.  The catheter was sutured in place and sterile dressing applied.   Complications/Tolerance None; patient tolerated the procedure well. Chest X-ray is ordered to verify placement for internal jugular or subclavian cannulation.   Chest x-ray is not ordered for femoral cannulation.  EBL Minimal  Specimen(s) None   Addendum> CXR reviewed, line venous but curved up.  Since retracted by 5cm, resutured, sterile dressing and biopatch reapplied.   Benjamin Boyer, MSN, NP, AG-ACNP-BC [facility] 12/27/2022, 10:11 AM  See Amion for pager If no response to pager , please call [phone] until 7pm After 7:00 pm call Elink   [phone]

## 2022-12-27 NOTE — Progress Notes (Signed)
NAME:  Benjamin Knight, MRN:  [id number], DOB:  06/02/1966, LOS: 1 ADMISSION DATE:  12/25/2022, CONSULTATION DATE:  12/26/22 REFERRING MD:  EDP, CHIEF COMPLAINT:  hepatic encephalopathy   History of Present Illness:  57 yo male with pmh noted below presented this evening after 2-3 days of worsening mentation. Wife is at bedside and provides most the history. Pt was in his normal state of health until about 2-3 days ago when he began having some "off" behaviors. He would forget things, get agitated for example. She inquired about his medications this weekend and he stated he had not taken his lactulose for >3weeks now as he felt he did not need it any longer. She was able to convince him to take a dose Sunday and Monday but only one. Today when he got up from a nap he could barely stand and walk. He was agitated but disoriented. She ran to get gas in her car and then returned within 10 mins to find broken items and him vomiting with some blood in it. She was able to get him into the car but he remained very confused.   When pt arrived he was noted to be somnolent but arousable. He is slightly tachypneic likely 2/2 metabolic acidosis. He was intolerant taking oral and rectal lactulose ordered. I recommended bicarb, ng placement, and lactulose per ng.   Pt is hemodynamically stable at this time, protecting airway and no acute indication for ICU admission. I have asked [facility] to admit pt and we will be happy to assist should there be any change in his exam.    Pertinent  Medical History  Cirrhosis 2/2 etoh abuse (no etoh for 2 years) Portal htn Monoclonal gammopathy Iron def anemia Celiac disease osa  Significant Hospital Events: Including procedures, antibiotic start and stop dates in addition to other pertinent events   Presented to hospital 6/25, admitted 6/26  Interim History / Subjective:   Started on peripheral levophed yesterday CVL placed this morning.   UOP minimal overnight, Cr is  rising  Updated patient's wife at bedside  Objective   Blood pressure (!) 80/59, pulse (!) 52, temperature (!) 96.4 F (35.8 C), temperature source Axillary, resp. rate 17, height 5\' 8"  (1.727 m), weight 91.1 kg, SpO2 100 %.    Vent Mode: PSV;CPAP FiO2 (%):  [40 %] 40 % Set Rate:  [28 bmp] 28 bmp Vt Set:  [450 mL-540 mL] 450 mL PEEP:  [5 cmH20-8 cmH20] 5 cmH20 Pressure Support:  [5 cmH20] 5 cmH20 Plateau Pressure:  [16 cmH20-18 cmH20] 16 cmH20   Intake/Output Summary (Last 24 hours) at 12/27/2022 0836 Last data filed at 12/27/2022 0800 Gross per 24 hour  Intake 5446.15 ml  Output 690 ml  Net 4756.15 ml   Filed Weights   12/25/22 2330 12/26/22 0916 12/27/22 0500  Weight: 94 kg 87 kg 91.1 kg    Examination: General: intubated, no distress HENT: [place]/AT, moist mucous membranes, sclera anicteric Lungs: clear to auscultation, no wheezing Cardiovascular: rrr, no murmurs Abdomen: mildly distended, soft Extremities: no c/c/e Neuro: does not awake to verbal stimuli GU: deferred  Resolved Hospital Problem list     Assessment & Plan:   Shock  - likely septic but unknown source, possibly SBP - continue vasopressor support for MAP 75 or greater - continue ceftriaxone and doxycycline for antibiotics, stop vancomycin - Will US abdomen and perform paracentesis to evaluate for SBP  Acute Hypoxemic Respiratory Failure - continue mechanical ventilatory support - sedation ordered  but not needed at this time  Hepatic Encephalopathy Decompensated Liver Cirrhosis - increase lactulose to q4hrs - GI following  Acute Kidney Injury Possible Hepatorenal syndrome Hypokalemia Metabolic Acidosis - Maintain MAP 75 or greater - start albumin 50g q 8 hrs x 3 doses - continue bicarb drip - replete potassium - start midodrine 10mg  TID  Best Practice (right click and "Reselect all SmartList Selections" daily)   Diet/type: NPO DVT prophylaxis: other high risk of bleed GI prophylaxis:  PPI Lines: N/A Foley:  N/A Code Status:  full code Last date of multidisciplinary goals of care discussion [with wife at bedside. 6/26]  Labs   CBC: Recent Labs  Lab 12/25/22 2114 12/25/22 2215 12/26/22 0820 12/26/22 0850 12/26/22 0926 12/26/22 1430 12/26/22 2241 12/27/22 0104  WBC 18.7*  --  19.7*  --   --   --   --  17.0*  HGB 16.0   < > 14.5 14.3 14.6 12.6* 11.9* 13.4  HCT 45.3   < > 41.8 42.0 43.0 37.0* 35.0* 36.6*  MCV 91.5  --  92.7  --   --   --   --  85.3  PLT 266  --  189  --   --   --   --  168   < > = values in this interval not displayed.    Basic Metabolic Panel: Recent Labs  Lab 12/26/22 0638 12/26/22 0820 12/26/22 0850 12/26/22 0926 12/26/22 0933 12/26/22 1430 12/26/22 2241 12/26/22 2341 12/27/22 0104 12/27/22 0438  NA 132*  --    < > 140 135 142 146* 140 141 140  K 2.2* 2.2*   < > 2.3* 2.3* 3.0* 2.6* 2.8* 2.6* 2.6*  CL 117*  --   --  122* 114*  --   --  119* 114* 117*  CO2 <7*  --   --   --  <7*  --   --  10* 9* 12*  GLUCOSE 155*  --   --  152* 185*  --   --  167* 158* 161*  BUN 25*  --   --  31* 27*  --   --  35* 36* 38*  CREATININE 2.35*  --   --  2.40* 2.41*  --   --  3.49* 3.65* 3.93*  CALCIUM 8.8*  --   --   --  9.0  --   --  8.5* 8.6* 8.3*  MG 1.7  --   --   --   --   --   --   --   --   --   PHOS  --  3.8  --   --   --   --   --   --   --  2.0*   < > = values in this interval not displayed.   GFR: Estimated Creatinine Clearance: 22.5 mL/min (A) (by C-G formula based on SCr of 3.93 mg/dL (H)). Recent Labs  Lab 12/25/22 2114 12/26/22 0820 12/26/22 1116 12/27/22 0104  PROCALCITON  --  0.54  --   --   WBC 18.7* 19.7*  --  17.0*  LATICACIDVEN  --  2.3* 1.9  --     Liver Function Tests: Recent Labs  Lab 12/25/22 2114 12/26/22 0638 12/27/22 0438  AST 43* 45*  --   ALT 19 16  --   ALKPHOS 211* 188*  --   BILITOT 5.7* 5.0*  --   PROT 8.4* 7.3  --   ALBUMIN 3.9 3.3*  2.7*   Recent Labs  Lab 12/26/22 0820  AMYLASE 240*    Recent Labs  Lab 12/25/22 2114 12/26/22 0630 12/26/22 0933  AMMONIA 308* 207* 247*    ABG    Component Value Date/Time   PHART 7.261 (L) 12/26/2022 2241   PCO2ART 20.9 (L) 12/26/2022 2241   PO2ART 151 (H) 12/26/2022 2241   HCO3 9.5 (L) 12/26/2022 2241   TCO2 10 (L) 12/26/2022 2241   ACIDBASEDEF 16.0 (H) 12/26/2022 2241   O2SAT 99 12/26/2022 2241     Coagulation Profile: Recent Labs  Lab 12/26/22 0820 12/27/22 0104  INR 2.1* 2.4*    Cardiac Enzymes: No results for input(s): "CKTOTAL", "CKMB", "CKMBINDEX", "TROPONINI" in the last 168 hours.  HbA1C: No results found for: "HGBA1C"  CBG: Recent Labs  Lab 12/26/22 1526 12/26/22 1954 12/26/22 2317 12/27/22 0324 12/27/22 0733  GLUCAP 135* 143* 159* 139* 119*    Critical care time: 45 min    Melody Comas, MD [facility] Office: [phone]   See Amion for personal pager PCCM on call pager [phone] until 7pm. Please call Elink 7p-7a. [phone]

## 2022-12-28 DIAGNOSIS — K766 Portal hypertension: Secondary | ICD-10-CM | POA: Diagnosis not present

## 2022-12-28 DIAGNOSIS — A4101 Sepsis due to Methicillin susceptible Staphylococcus aureus: Secondary | ICD-10-CM | POA: Diagnosis not present

## 2022-12-28 DIAGNOSIS — N3289 Other specified disorders of bladder: Secondary | ICD-10-CM | POA: Diagnosis not present

## 2022-12-28 DIAGNOSIS — Z7682 Awaiting organ transplant status: Secondary | ICD-10-CM | POA: Diagnosis not present

## 2022-12-28 DIAGNOSIS — N390 Urinary tract infection, site not specified: Secondary | ICD-10-CM | POA: Diagnosis not present

## 2022-12-28 DIAGNOSIS — B9561 Methicillin susceptible Staphylococcus aureus infection as the cause of diseases classified elsewhere: Secondary | ICD-10-CM | POA: Diagnosis not present

## 2022-12-28 DIAGNOSIS — Z01818 Encounter for other preprocedural examination: Secondary | ICD-10-CM | POA: Diagnosis not present

## 2022-12-28 DIAGNOSIS — J9811 Atelectasis: Secondary | ICD-10-CM | POA: Diagnosis not present

## 2022-12-28 DIAGNOSIS — D689 Coagulation defect, unspecified: Secondary | ICD-10-CM | POA: Diagnosis not present

## 2022-12-28 DIAGNOSIS — Z4682 Encounter for fitting and adjustment of non-vascular catheter: Secondary | ICD-10-CM | POA: Diagnosis not present

## 2022-12-28 DIAGNOSIS — K7031 Alcoholic cirrhosis of liver with ascites: Secondary | ICD-10-CM | POA: Diagnosis not present

## 2022-12-28 DIAGNOSIS — R918 Other nonspecific abnormal finding of lung field: Secondary | ICD-10-CM | POA: Diagnosis not present

## 2022-12-28 DIAGNOSIS — K828 Other specified diseases of gallbladder: Secondary | ICD-10-CM | POA: Diagnosis not present

## 2022-12-28 DIAGNOSIS — Z008 Encounter for other general examination: Secondary | ICD-10-CM | POA: Diagnosis not present

## 2022-12-28 DIAGNOSIS — J9601 Acute respiratory failure with hypoxia: Secondary | ICD-10-CM | POA: Diagnosis not present

## 2022-12-28 DIAGNOSIS — R7881 Bacteremia: Secondary | ICD-10-CM | POA: Diagnosis not present

## 2022-12-28 DIAGNOSIS — K7682 Hepatic encephalopathy: Secondary | ICD-10-CM | POA: Diagnosis not present

## 2022-12-28 DIAGNOSIS — I81 Portal vein thrombosis: Secondary | ICD-10-CM | POA: Diagnosis not present

## 2022-12-28 DIAGNOSIS — R6521 Severe sepsis with septic shock: Secondary | ICD-10-CM | POA: Diagnosis not present

## 2022-12-28 DIAGNOSIS — N170 Acute kidney failure with tubular necrosis: Secondary | ICD-10-CM | POA: Diagnosis not present

## 2022-12-28 DIAGNOSIS — F4324 Adjustment disorder with disturbance of conduct: Secondary | ICD-10-CM | POA: Diagnosis not present

## 2022-12-28 DIAGNOSIS — E874 Mixed disorder of acid-base balance: Secondary | ICD-10-CM | POA: Diagnosis not present

## 2022-12-28 DIAGNOSIS — K862 Cyst of pancreas: Secondary | ICD-10-CM | POA: Diagnosis not present

## 2022-12-28 DIAGNOSIS — K121 Other forms of stomatitis: Secondary | ICD-10-CM | POA: Diagnosis not present

## 2022-12-28 DIAGNOSIS — D638 Anemia in other chronic diseases classified elsewhere: Secondary | ICD-10-CM | POA: Diagnosis not present

## 2022-12-28 DIAGNOSIS — D472 Monoclonal gammopathy: Secondary | ICD-10-CM | POA: Diagnosis not present

## 2022-12-28 DIAGNOSIS — D72825 Bandemia: Secondary | ICD-10-CM | POA: Diagnosis not present

## 2022-12-28 DIAGNOSIS — F319 Bipolar disorder, unspecified: Secondary | ICD-10-CM | POA: Diagnosis not present

## 2022-12-28 DIAGNOSIS — J690 Pneumonitis due to inhalation of food and vomit: Secondary | ICD-10-CM | POA: Diagnosis not present

## 2022-12-28 DIAGNOSIS — K760 Fatty (change of) liver, not elsewhere classified: Secondary | ICD-10-CM | POA: Diagnosis not present

## 2022-12-28 DIAGNOSIS — J811 Chronic pulmonary edema: Secondary | ICD-10-CM | POA: Diagnosis not present

## 2022-12-28 DIAGNOSIS — R579 Shock, unspecified: Secondary | ICD-10-CM | POA: Diagnosis not present

## 2022-12-28 DIAGNOSIS — K900 Celiac disease: Secondary | ICD-10-CM | POA: Diagnosis not present

## 2022-12-28 DIAGNOSIS — D6959 Other secondary thrombocytopenia: Secondary | ICD-10-CM | POA: Diagnosis not present

## 2022-12-28 DIAGNOSIS — K7030 Alcoholic cirrhosis of liver without ascites: Secondary | ICD-10-CM | POA: Diagnosis not present

## 2022-12-28 DIAGNOSIS — K767 Hepatorenal syndrome: Secondary | ICD-10-CM | POA: Diagnosis not present

## 2022-12-28 DIAGNOSIS — K7460 Unspecified cirrhosis of liver: Secondary | ICD-10-CM | POA: Diagnosis not present

## 2022-12-28 DIAGNOSIS — N179 Acute kidney failure, unspecified: Secondary | ICD-10-CM | POA: Diagnosis not present

## 2022-12-28 DIAGNOSIS — F4325 Adjustment disorder with mixed disturbance of emotions and conduct: Secondary | ICD-10-CM | POA: Diagnosis not present

## 2022-12-28 DIAGNOSIS — K7469 Other cirrhosis of liver: Secondary | ICD-10-CM | POA: Diagnosis not present

## 2022-12-28 DIAGNOSIS — Z01810 Encounter for preprocedural cardiovascular examination: Secondary | ICD-10-CM | POA: Diagnosis not present

## 2022-12-28 DIAGNOSIS — R001 Bradycardia, unspecified: Secondary | ICD-10-CM | POA: Diagnosis not present

## 2022-12-28 DIAGNOSIS — A419 Sepsis, unspecified organism: Secondary | ICD-10-CM | POA: Diagnosis not present

## 2022-12-28 DIAGNOSIS — R161 Splenomegaly, not elsewhere classified: Secondary | ICD-10-CM | POA: Diagnosis not present

## 2022-12-28 DIAGNOSIS — R768 Other specified abnormal immunological findings in serum: Secondary | ICD-10-CM | POA: Diagnosis not present

## 2022-12-28 LAB — CULTURE, BLOOD (ROUTINE X 2)
Special Requests: ADEQUATE
Special Requests: ADEQUATE

## 2022-12-29 LAB — CULTURE, BLOOD (ROUTINE X 2)
Culture: NO GROWTH
Special Requests: ADEQUATE

## 2022-12-30 LAB — CULTURE, BLOOD (ROUTINE X 2): Culture: NO GROWTH

## 2022-12-31 LAB — CULTURE, BLOOD (ROUTINE X 2): Culture: NO GROWTH

## 2023-01-01 LAB — MISC LABCORP TEST (SEND OUT)

## 2023-01-04 LAB — MISC LABCORP TEST (SEND OUT)

## 2023-01-23 DIAGNOSIS — Z7682 Awaiting organ transplant status: Secondary | ICD-10-CM | POA: Diagnosis not present

## 2023-01-24 DIAGNOSIS — Z01818 Encounter for other preprocedural examination: Secondary | ICD-10-CM | POA: Diagnosis not present

## 2023-01-24 DIAGNOSIS — Z794 Long term (current) use of insulin: Secondary | ICD-10-CM | POA: Diagnosis not present

## 2023-01-24 DIAGNOSIS — F4320 Adjustment disorder, unspecified: Secondary | ICD-10-CM | POA: Diagnosis not present

## 2023-01-24 DIAGNOSIS — G4733 Obstructive sleep apnea (adult) (pediatric): Secondary | ICD-10-CM | POA: Diagnosis not present

## 2023-01-24 DIAGNOSIS — M270 Developmental disorders of jaws: Secondary | ICD-10-CM | POA: Diagnosis not present

## 2023-01-24 DIAGNOSIS — Z792 Long term (current) use of antibiotics: Secondary | ICD-10-CM | POA: Diagnosis not present

## 2023-01-24 DIAGNOSIS — T8119XA Other postprocedural shock, initial encounter: Secondary | ICD-10-CM | POA: Diagnosis not present

## 2023-01-24 DIAGNOSIS — K862 Cyst of pancreas: Secondary | ICD-10-CM | POA: Diagnosis not present

## 2023-01-24 DIAGNOSIS — Z7682 Awaiting organ transplant status: Secondary | ICD-10-CM | POA: Diagnosis not present

## 2023-01-24 DIAGNOSIS — I8510 Secondary esophageal varices without bleeding: Secondary | ICD-10-CM | POA: Diagnosis not present

## 2023-01-24 DIAGNOSIS — N170 Acute kidney failure with tubular necrosis: Secondary | ICD-10-CM | POA: Diagnosis not present

## 2023-01-24 DIAGNOSIS — K7210 Chronic hepatic failure without coma: Secondary | ICD-10-CM | POA: Diagnosis not present

## 2023-01-24 DIAGNOSIS — D84821 Immunodeficiency due to drugs: Secondary | ICD-10-CM | POA: Diagnosis not present

## 2023-01-24 DIAGNOSIS — K219 Gastro-esophageal reflux disease without esophagitis: Secondary | ICD-10-CM | POA: Diagnosis not present

## 2023-01-24 DIAGNOSIS — D6959 Other secondary thrombocytopenia: Secondary | ICD-10-CM | POA: Diagnosis not present

## 2023-01-24 DIAGNOSIS — I471 Supraventricular tachycardia, unspecified: Secondary | ICD-10-CM | POA: Diagnosis not present

## 2023-01-24 DIAGNOSIS — Z944 Liver transplant status: Secondary | ICD-10-CM | POA: Diagnosis not present

## 2023-01-24 DIAGNOSIS — E878 Other disorders of electrolyte and fluid balance, not elsewhere classified: Secondary | ICD-10-CM | POA: Diagnosis not present

## 2023-01-24 DIAGNOSIS — E099 Drug or chemical induced diabetes mellitus without complications: Secondary | ICD-10-CM | POA: Diagnosis not present

## 2023-01-24 DIAGNOSIS — D472 Monoclonal gammopathy: Secondary | ICD-10-CM | POA: Diagnosis not present

## 2023-01-24 DIAGNOSIS — K7030 Alcoholic cirrhosis of liver without ascites: Secondary | ICD-10-CM | POA: Diagnosis not present

## 2023-01-24 DIAGNOSIS — K429 Umbilical hernia without obstruction or gangrene: Secondary | ICD-10-CM | POA: Diagnosis not present

## 2023-01-24 DIAGNOSIS — Z20828 Contact with and (suspected) exposure to other viral communicable diseases: Secondary | ICD-10-CM | POA: Diagnosis not present

## 2023-01-24 DIAGNOSIS — T380X5A Adverse effect of glucocorticoids and synthetic analogues, initial encounter: Secondary | ICD-10-CM | POA: Diagnosis not present

## 2023-01-24 DIAGNOSIS — D849 Immunodeficiency, unspecified: Secondary | ICD-10-CM | POA: Diagnosis not present

## 2023-01-24 DIAGNOSIS — F419 Anxiety disorder, unspecified: Secondary | ICD-10-CM | POA: Diagnosis not present

## 2023-01-24 DIAGNOSIS — D62 Acute posthemorrhagic anemia: Secondary | ICD-10-CM | POA: Diagnosis not present

## 2023-01-24 DIAGNOSIS — N189 Chronic kidney disease, unspecified: Secondary | ICD-10-CM | POA: Diagnosis not present

## 2023-01-31 ENCOUNTER — Telehealth: Payer: Self-pay | Admitting: Hematology and Oncology

## 2023-02-15 DIAGNOSIS — E099 Drug or chemical induced diabetes mellitus without complications: Secondary | ICD-10-CM | POA: Diagnosis not present

## 2023-02-15 DIAGNOSIS — T380X5A Adverse effect of glucocorticoids and synthetic analogues, initial encounter: Secondary | ICD-10-CM | POA: Diagnosis not present

## 2023-02-18 ENCOUNTER — Telehealth: Payer: Self-pay | Admitting: Hematology and Oncology

## 2023-02-19 ENCOUNTER — Other Ambulatory Visit: Payer: BC Managed Care – PPO

## 2023-02-19 DIAGNOSIS — F4320 Adjustment disorder, unspecified: Secondary | ICD-10-CM | POA: Diagnosis not present

## 2023-02-22 ENCOUNTER — Other Ambulatory Visit: Payer: BC Managed Care – PPO

## 2023-02-26 ENCOUNTER — Ambulatory Visit: Payer: BC Managed Care – PPO | Admitting: Hematology and Oncology

## 2023-02-27 DIAGNOSIS — D849 Immunodeficiency, unspecified: Secondary | ICD-10-CM | POA: Diagnosis not present

## 2023-02-27 DIAGNOSIS — Z20828 Contact with and (suspected) exposure to other viral communicable diseases: Secondary | ICD-10-CM | POA: Diagnosis not present

## 2023-02-27 DIAGNOSIS — F419 Anxiety disorder, unspecified: Secondary | ICD-10-CM | POA: Diagnosis not present

## 2023-02-27 DIAGNOSIS — Z792 Long term (current) use of antibiotics: Secondary | ICD-10-CM | POA: Diagnosis not present

## 2023-02-27 DIAGNOSIS — Z944 Liver transplant status: Secondary | ICD-10-CM | POA: Diagnosis not present

## 2023-02-27 DIAGNOSIS — E878 Other disorders of electrolyte and fluid balance, not elsewhere classified: Secondary | ICD-10-CM | POA: Diagnosis not present

## 2023-03-01 ENCOUNTER — Ambulatory Visit: Payer: BC Managed Care – PPO | Admitting: Hematology and Oncology

## 2023-03-05 DIAGNOSIS — F4320 Adjustment disorder, unspecified: Secondary | ICD-10-CM | POA: Diagnosis not present

## 2023-03-06 DIAGNOSIS — F4322 Adjustment disorder with anxiety: Secondary | ICD-10-CM | POA: Diagnosis not present

## 2023-03-06 DIAGNOSIS — Z944 Liver transplant status: Secondary | ICD-10-CM | POA: Diagnosis not present

## 2023-03-07 DIAGNOSIS — Z944 Liver transplant status: Secondary | ICD-10-CM | POA: Diagnosis not present

## 2023-03-07 DIAGNOSIS — F1011 Alcohol abuse, in remission: Secondary | ICD-10-CM | POA: Diagnosis not present

## 2023-03-07 DIAGNOSIS — D849 Immunodeficiency, unspecified: Secondary | ICD-10-CM | POA: Diagnosis not present

## 2023-03-14 DIAGNOSIS — Z944 Liver transplant status: Secondary | ICD-10-CM | POA: Diagnosis not present

## 2023-03-14 DIAGNOSIS — D849 Immunodeficiency, unspecified: Secondary | ICD-10-CM | POA: Diagnosis not present

## 2023-03-21 DIAGNOSIS — D849 Immunodeficiency, unspecified: Secondary | ICD-10-CM | POA: Diagnosis not present

## 2023-03-21 DIAGNOSIS — Z944 Liver transplant status: Secondary | ICD-10-CM | POA: Diagnosis not present

## 2023-03-22 DIAGNOSIS — E099 Drug or chemical induced diabetes mellitus without complications: Secondary | ICD-10-CM | POA: Diagnosis not present

## 2023-03-22 DIAGNOSIS — Z944 Liver transplant status: Secondary | ICD-10-CM | POA: Diagnosis not present

## 2023-03-22 DIAGNOSIS — T380X5A Adverse effect of glucocorticoids and synthetic analogues, initial encounter: Secondary | ICD-10-CM | POA: Diagnosis not present

## 2023-03-22 DIAGNOSIS — E119 Type 2 diabetes mellitus without complications: Secondary | ICD-10-CM | POA: Diagnosis not present

## 2023-03-26 DIAGNOSIS — F4320 Adjustment disorder, unspecified: Secondary | ICD-10-CM | POA: Diagnosis not present

## 2023-04-02 DIAGNOSIS — D849 Immunodeficiency, unspecified: Secondary | ICD-10-CM | POA: Diagnosis not present

## 2023-04-02 DIAGNOSIS — Z944 Liver transplant status: Secondary | ICD-10-CM | POA: Diagnosis not present

## 2023-04-02 DIAGNOSIS — N179 Acute kidney failure, unspecified: Secondary | ICD-10-CM | POA: Diagnosis not present

## 2023-04-11 DIAGNOSIS — Z944 Liver transplant status: Secondary | ICD-10-CM | POA: Diagnosis not present

## 2023-04-11 DIAGNOSIS — D849 Immunodeficiency, unspecified: Secondary | ICD-10-CM | POA: Diagnosis not present

## 2023-04-23 DIAGNOSIS — F4320 Adjustment disorder, unspecified: Secondary | ICD-10-CM | POA: Diagnosis not present

## 2023-04-29 DIAGNOSIS — D849 Immunodeficiency, unspecified: Secondary | ICD-10-CM | POA: Diagnosis not present

## 2023-04-29 DIAGNOSIS — Z944 Liver transplant status: Secondary | ICD-10-CM | POA: Diagnosis not present

## 2023-05-07 DIAGNOSIS — K7030 Alcoholic cirrhosis of liver without ascites: Secondary | ICD-10-CM | POA: Diagnosis not present

## 2023-05-07 DIAGNOSIS — N179 Acute kidney failure, unspecified: Secondary | ICD-10-CM | POA: Diagnosis not present

## 2023-05-07 DIAGNOSIS — Z7982 Long term (current) use of aspirin: Secondary | ICD-10-CM | POA: Diagnosis not present

## 2023-05-07 DIAGNOSIS — G8918 Other acute postprocedural pain: Secondary | ICD-10-CM | POA: Diagnosis not present

## 2023-05-07 DIAGNOSIS — D849 Immunodeficiency, unspecified: Secondary | ICD-10-CM | POA: Diagnosis not present

## 2023-05-07 DIAGNOSIS — E669 Obesity, unspecified: Secondary | ICD-10-CM | POA: Diagnosis not present

## 2023-05-07 DIAGNOSIS — E878 Other disorders of electrolyte and fluid balance, not elsewhere classified: Secondary | ICD-10-CM | POA: Diagnosis not present

## 2023-05-07 DIAGNOSIS — K900 Celiac disease: Secondary | ICD-10-CM | POA: Diagnosis not present

## 2023-05-07 DIAGNOSIS — Z6832 Body mass index (BMI) 32.0-32.9, adult: Secondary | ICD-10-CM | POA: Diagnosis not present

## 2023-05-07 DIAGNOSIS — Z4823 Encounter for aftercare following liver transplant: Secondary | ICD-10-CM | POA: Diagnosis not present

## 2023-05-07 DIAGNOSIS — R339 Retention of urine, unspecified: Secondary | ICD-10-CM | POA: Diagnosis not present

## 2023-05-07 DIAGNOSIS — Z944 Liver transplant status: Secondary | ICD-10-CM | POA: Diagnosis not present

## 2023-05-07 DIAGNOSIS — Z79899 Other long term (current) drug therapy: Secondary | ICD-10-CM | POA: Diagnosis not present

## 2023-05-07 DIAGNOSIS — R030 Elevated blood-pressure reading, without diagnosis of hypertension: Secondary | ICD-10-CM | POA: Diagnosis not present

## 2023-05-07 DIAGNOSIS — R3911 Hesitancy of micturition: Secondary | ICD-10-CM | POA: Diagnosis not present

## 2023-05-07 DIAGNOSIS — R21 Rash and other nonspecific skin eruption: Secondary | ICD-10-CM | POA: Diagnosis not present

## 2023-05-07 DIAGNOSIS — D84821 Immunodeficiency due to drugs: Secondary | ICD-10-CM | POA: Diagnosis not present

## 2023-05-10 DIAGNOSIS — D849 Immunodeficiency, unspecified: Secondary | ICD-10-CM | POA: Diagnosis not present

## 2023-05-10 DIAGNOSIS — Z944 Liver transplant status: Secondary | ICD-10-CM | POA: Diagnosis not present

## 2023-05-28 DIAGNOSIS — Z944 Liver transplant status: Secondary | ICD-10-CM | POA: Diagnosis not present

## 2023-05-28 DIAGNOSIS — D849 Immunodeficiency, unspecified: Secondary | ICD-10-CM | POA: Diagnosis not present

## 2023-06-05 DIAGNOSIS — F4320 Adjustment disorder, unspecified: Secondary | ICD-10-CM | POA: Diagnosis not present

## 2023-06-06 DIAGNOSIS — Z92241 Personal history of systemic steroid therapy: Secondary | ICD-10-CM | POA: Diagnosis not present

## 2023-06-06 DIAGNOSIS — Z944 Liver transplant status: Secondary | ICD-10-CM | POA: Diagnosis not present

## 2023-06-06 DIAGNOSIS — M7989 Other specified soft tissue disorders: Secondary | ICD-10-CM | POA: Diagnosis not present

## 2023-06-07 ENCOUNTER — Other Ambulatory Visit (HOSPITAL_COMMUNITY): Payer: Self-pay | Admitting: Physician Assistant

## 2023-06-07 ENCOUNTER — Ambulatory Visit (HOSPITAL_COMMUNITY)
Admission: RE | Admit: 2023-06-07 | Discharge: 2023-06-07 | Disposition: A | Discharge status: Home / Self Care | Payer: BC Managed Care – PPO | Source: Ambulatory Visit | Attending: Vascular Surgery | Admitting: Vascular Surgery

## 2023-06-07 ENCOUNTER — Other Ambulatory Visit: Payer: Self-pay | Admitting: Physician Assistant

## 2023-06-07 DIAGNOSIS — M7989 Other specified soft tissue disorders: Secondary | ICD-10-CM

## 2023-06-07 DIAGNOSIS — Z92241 Personal history of systemic steroid therapy: Secondary | ICD-10-CM

## 2023-06-11 DIAGNOSIS — L309 Dermatitis, unspecified: Secondary | ICD-10-CM | POA: Diagnosis not present

## 2023-06-11 DIAGNOSIS — L03116 Cellulitis of left lower limb: Secondary | ICD-10-CM | POA: Diagnosis not present

## 2023-06-12 DIAGNOSIS — E099 Drug or chemical induced diabetes mellitus without complications: Secondary | ICD-10-CM | POA: Diagnosis not present

## 2023-06-12 DIAGNOSIS — T380X5A Adverse effect of glucocorticoids and synthetic analogues, initial encounter: Secondary | ICD-10-CM | POA: Diagnosis not present

## 2023-06-12 DIAGNOSIS — Z944 Liver transplant status: Secondary | ICD-10-CM | POA: Diagnosis not present

## 2023-06-12 DIAGNOSIS — E1165 Type 2 diabetes mellitus with hyperglycemia: Secondary | ICD-10-CM | POA: Diagnosis not present

## 2023-06-12 DIAGNOSIS — R7303 Prediabetes: Secondary | ICD-10-CM | POA: Diagnosis not present

## 2023-06-14 ENCOUNTER — Ambulatory Visit: Payer: BC Managed Care – PPO | Admitting: Hematology and Oncology

## 2023-06-17 DIAGNOSIS — Z944 Liver transplant status: Secondary | ICD-10-CM | POA: Diagnosis not present

## 2023-06-17 DIAGNOSIS — D849 Immunodeficiency, unspecified: Secondary | ICD-10-CM | POA: Diagnosis not present

## 2023-06-18 DIAGNOSIS — E875 Hyperkalemia: Secondary | ICD-10-CM | POA: Diagnosis not present

## 2023-06-23 DIAGNOSIS — L03116 Cellulitis of left lower limb: Secondary | ICD-10-CM | POA: Diagnosis not present

## 2023-06-26 ENCOUNTER — Other Ambulatory Visit: Payer: Self-pay | Admitting: Hematology and Oncology

## 2023-06-26 DIAGNOSIS — D500 Iron deficiency anemia secondary to blood loss (chronic): Secondary | ICD-10-CM

## 2023-06-26 DIAGNOSIS — D472 Monoclonal gammopathy: Secondary | ICD-10-CM

## 2023-06-26 NOTE — Progress Notes (Unsigned)
[facility] [phone]   [phone]  PROGRESS NOTE  Patient Care Team: Delma Officer, PA as PCP - General (Internal Medicine)  Hematological/Oncological History # IgG Kappa Monoclonal Gammopathy of Undetermined Significance # Iron Deficiency Anemia  1) 03/31/2021:  -SPEP detected M-protein 1.2 g/dL. Serum free light chains showed kappa light chain 109.8 mg/L, lambda light chains 35.1 mg/L and elevated ratio of 3.13.  -Vitamin B12 level 1,095, Ferritin 27, Iron 72, TIBC 514 (H), Iron saturation 14% (L), folate 12.8.   2) 04/01/2021: WBC 10.6 (H), Hgb 10.5 (L), MCV 82.7, Plt 228, Creatinine 1.15, Calcium 8.5 (L).    3) 04/21/2021: Establish care with [facility] Hematology/Oncology  Interval History:  Benjamin Knight 57 y.o. male with medical history significant for MGUS and iron deficiency anemia who presents for a follow up visit. The patient's last visit was on 08/30/2022.  In the interim since his last visit he has had no major changes in his health.  On exam today Benjamin Knight reports he has been well overall in the interim since our last visit.  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  He notes he is healing well from the procedure and is not having any complications.  He reports that his appetite is strong and his energy levels are quite good.  He does enjoy eating red meat as he is a smoker and smokes his own meat.  He is having an issue with an infection on his left leg for which he is currently taking Bactrim.  He reports it opened up and drained and does appear to be improving on antibiotic therapy.  Otherwise he has not had any issues with bone or back pain. Overall he is at his baseline level of health with no questions, comments or concerns today.  He otherwise denies any fevers, chills, sweats, nausea, vomiting or diarrhea.  Full 10 point ROS is listed below.  MEDICAL HISTORY:  Past Medical  History:  Diagnosis Date   Alcoholic cirrhosis (HCC)    Bipolar disorder (HCC)    Chronic back pain    Cirrhosis (HCC)    Depression    Esophageal varices (HCC)    Portal hypertensive gastropathy (HCC)     SURGICAL HISTORY: Past Surgical History:  Procedure Laterality Date   APPENDECTOMY      SOCIAL HISTORY: Social History   Socioeconomic History   Marital status: Married    Spouse name: Not on file   Number of children: Not on file   Years of education: Not on file   Highest education level: Not on file  Occupational History   Not on file  Tobacco Use   Smoking status: Never   Smokeless tobacco: Current    Types: Chew  Substance and Sexual Activity   Alcohol use: Yes    Comment: Per wife, he has an extensive history of alcohol use with prior attempts of quitting. However, recently noted to be drinking up to 1/5 liquor on weekends and half of 1/5 liquor daily on weekdays until Labor day weekend.   Drug use: Never   Sexual activity: Not on file  Other Topics Concern   Not on file  Social History Narrative   Not on file   Social Drivers of Health   Financial Resource Strain: Low Risk  (02/12/2023)   Received from [facility]   Overall Financial Resource Strain (CARDIA)    Difficulty of Paying  Living Expenses: Not hard at all  Food Insecurity: No Food Insecurity (02/12/2023)   Received from [facility]   Hunger Vital Sign    Worried About Running Out of Food in the Last Year: Never true    Ran Out of Food in the Last Year: Never true  Transportation Needs: No Transportation Needs (02/12/2023)   Received [facility]    In the past 12 months, has lack of transportation kept you from medical appointments or from getting medications?: No    Lack of Transportation (Non-Medical): No  Physical Activity: Not on file  Stress: Not on file  Social Connections: Not on file  Intimate  Partner Violence: Not on file    FAMILY HISTORY: Family History  Problem Relation Age of Onset   Heart disease Mother     ALLERGIES:  is allergic to barley grass, other, sertraline, and wheat.  MEDICATIONS:  Current Outpatient Medications  Medication Sig Dispense Refill   acetaminophen (TYLENOL) 325 MG tablet Take by mouth.     aspirin EC 81 MG tablet Take 81 mg by mouth daily.     Calcium Citrate-Vitamin D 315-5 MG-MCG TABS Take by mouth.     famotidine (PEPCID) 20 MG tablet Take by mouth.     melatonin 3 MG TABS tablet Take 3 mg by mouth at bedtime as needed.     Specialty Vitamins Products (MG PLUS PROTEIN) 133 MG TABS Take by mouth.     tacrolimus (PROGRAF) 0.5 MG capsule Take 0.5 mg by mouth. 1.5 mg I am and 2mg  at night     traZODone (DESYREL) 50 MG tablet Take 1 tablet by mouth at bedtime.     triamcinolone cream (KENALOG) 0.1 % Apply topically.     mycophenolate (CELLCEPT) 250 MG capsule Take 1,000 mg by mouth 2 (two) times daily.     sulfamethoxazole-trimethoprim (BACTRIM DS) 800-160 MG tablet Take 1 tablet by mouth 2 (two) times daily.     valGANciclovir (VALCYTE) 450 MG tablet Take 900 mg by mouth daily.     No current facility-administered medications for this visit.    REVIEW OF SYSTEMS:   Constitutional: ( - ) fevers, ( - )  chills , ( - ) night sweats Eyes: ( - ) blurriness of vision, ( - ) double vision, ( - ) watery eyes Ears, nose, mouth, throat, and face: ( - ) mucositis, ( - ) sore throat Respiratory: ( - ) cough, ( - ) dyspnea, ( - ) wheezes Cardiovascular: ( - ) palpitation, ( - ) chest discomfort, ( - ) lower extremity swelling Gastrointestinal:  ( - ) nausea, ( - ) heartburn, ( - ) change in bowel habits Skin: ( - ) abnormal skin rashes Lymphatics: ( - ) new lymphadenopathy, ( - ) easy bruising Neurological: ( - ) numbness, ( - ) tingling, ( - ) new weaknesses Behavioral/Psych: ( - ) mood change, ( - ) new changes  All other systems were reviewed with  the patient and are negative.  PHYSICAL EXAMINATION: ECOG PERFORMANCE STATUS: 1 - Symptomatic but completely ambulatory  Vitals:   06/27/23 0836  BP: (!) 142/90  Pulse: 81  Resp: 16  Temp: (!) 97.2 F (36.2 C)  SpO2: 100%      Filed Weights   06/27/23 0836  Weight: 215 lb 8 oz (97.8 kg)       GENERAL: Well-appearing middle-age Caucasian male, alert, no distress and comfortable  SKIN: skin color, texture, turgor are normal, no rashes or significant lesions EYES: conjunctiva are pink and non-injected, sclera clear LUNGS: clear to auscultation and percussion with normal breathing effort HEART: regular rate & rhythm and no murmurs and no lower extremity edema Musculoskeletal: no cyanosis of digits and no clubbing  PSYCH: alert & oriented x 3, fluent speech NEURO: no focal motor/sensory deficits  LABORATORY DATA:  I have reviewed the data as listed    Latest Ref Rng & Units 06/27/2023    9:30 AM 12/27/2022    6:21 PM 12/27/2022    9:33 AM  CBC  WBC 4.0 - 10.5 K/uL 3.9     Hemoglobin 13.0 - 17.0 g/dL 10.2  72.5  36.6   Hematocrit 39.0 - 52.0 % 34.2  32.0  33.0   Platelets 150 - 400 K/uL 324          Latest Ref Rng & Units 12/27/2022    6:21 PM 12/27/2022    4:04 PM 12/27/2022    9:33 AM  CMP  Glucose 70 - 99 mg/dL  440    BUN 6 - 20 mg/dL  41    Creatinine 3.47 - 1.24 mg/dL  4.25    Sodium 956 - 387 mmol/L 144  138  145   Potassium 3.5 - 5.1 mmol/L 2.6  2.3  2.9   Chloride 98 - 111 mmol/L  113    CO2 22 - 32 mmol/L  13    Calcium 8.9 - 10.3 mg/dL  7.5    Total Protein 6.5 - 8.1 g/dL  5.6    Total Bilirubin 0.3 - 1.2 mg/dL  4.2    Alkaline Phos 38 - 126 U/L  138    AST 15 - 41 U/L  41    ALT 0 - 44 U/L  15      Lab Results  Component Value Date   MPROTEIN 0.8 (H) 08/30/2022   MPROTEIN 1.1 (H) 12/25/2021   MPROTEIN 0.9 (H) 04/21/2021   Lab Results  Component Value Date   KPAFRELGTCHN 87.3 (H) 08/30/2022   KPAFRELGTCHN 75.4 (H) 12/25/2021   KPAFRELGTCHN  105.5 (H) 04/21/2021   LAMBDASER 30.7 (H) 08/30/2022   LAMBDASER 28.8 (H) 12/25/2021   LAMBDASER 40.2 (H) 04/21/2021   KAPLAMBRATIO 2.84 (H) 08/30/2022   KAPLAMBRATIO 2.62 (H) 12/25/2021   KAPLAMBRATIO 9.85 04/24/2021    RADIOGRAPHIC STUDIES: VAS [place] LOWER EXTREMITY VENOUS (DVT) Result Date: 06/07/2023  Lower Venous DVT Study Patient Name:  Benjamin Knight  Date of Exam:   06/07/2023 Medical Rec #: [id number]         Accession #:    [id number] Date of Birth: 1965/07/28         Patient Gender: M Patient Age:   44 years Exam Location:  [facility] Procedure:      VAS [place] LOWER EXTREMITY VENOUS (DVT) Referring Phys: Eliane Decree --------------------------------------------------------------------------------  Indications: Left lower leg edema, redness, heat and discomfort 2 days.  Performing Technologist: Dorthula Matas RVS, RCS  Examination Guidelines: A complete evaluation includes B-mode imaging, spectral Doppler, color Doppler, and power Doppler as needed of all accessible portions of each vessel. Bilateral testing is considered an integral part of a complete examination. Limited examinations for reoccurring indications may be performed as noted. The reflux portion of the exam is performed with the patient in reverse Trendelenburg.  +---------+---------------+---------+-----------+------------+--------------+ LEFT     CompressibilityPhasicitySpontaneityProperties  Thrombus Aging +---------+---------------+---------+-----------+------------+--------------+ CFV      Full                                                          +---------+---------------+---------+-----------+------------+--------------+  SFJ      Full                                                          +---------+---------------+---------+-----------+------------+--------------+ FV Prox  Full                                                           +---------+---------------+---------+-----------+------------+--------------+ FV Mid   Full                                                          +---------+---------------+---------+-----------+------------+--------------+ FV DistalFull                                                          +---------+---------------+---------+-----------+------------+--------------+ POP      Full                                                          +---------+---------------+---------+-----------+------------+--------------+ PTV      Full                                                          +---------+---------------+---------+-----------+------------+--------------+ GSV      Full                               incompetence               +---------+---------------+---------+-----------+------------+--------------+    Findings reported to Results routed in epic at 1:12 pm.  Summary: LEFT: - There is no evidence of deep vein thrombosis in the lower extremity. - There is no evidence of superficial venous thrombosis.  - No cystic structure found in the popliteal fossa.  - Valsalva maneuver demonstrates incompetence of the proximal great saphenous vein.  *See table(s) above for measurements and observations. Electronically signed by Carolynn Sayers on 06/07/2023 at 2:21:53 PM.    Final     ASSESSMENT & PLAN Benjamin Knight 57 y.o. male with medical history significant for MGUS and iron deficiency anemia who presents for a follow up visit.   #Monoclonal gammopathy of Uncertain Significance: --repeat CBC, CMP, SPEP/IFE,  kappa/lambda light chain q 6 months with UPEP and met survey yearly.   --DG bone met survey showed no clear lytic lesions.  --bone marrow biopsy performed due to elevated serum free light chain ratio of 3.13. Performed on 05/04/2021, showed  4% plasma cells. Did not meet criteria for Multiple myeloma.   --M protein 0.8 at last check on 08/30/2022.  Labs pending from  today. --RTC in 6 months    #Cellulitis -- Patient notes that the cellulitis on his left leg is improving and draining.  He is on Bactrim. -- Strict return precautions if he were to develop any fevers, chills, sweats or if cellulitis were to worsen rather than improve.  # Cirrhosis: --Diagnosed recently in September 2022 after presenting with AMS due to acute hepatic encephalopathy.  --Currently on Lactulose TID. --Patient follows with [facility]   #Iron deficiency Anemia of Uncertain Etiology: --labs today show white blood cell 3.9, hemoglobin 1.2, MCV 85.9, platelets 324 -- continue ferrous sulfate 325 mg once daily.   -- recieved IV iron sucrose 200 mg q. 7 days x 5 doses from 05/19/21-06/27/2021. --EGD performed on 10/20/2021, showed small esophageal varices and portal gastropathy  --RTC in 6 months to assess iron stores.    No orders of the defined types were placed in this encounter.  All questions were answered. The patient knows to call the clinic with any problems, questions or concerns.  A total of more than 30 minutes were spent on this encounter with face-to-face time and non-face-to-face time, including preparing to see the patient, ordering tests and/or medications, counseling the patient and coordination of care as outlined above.   Benjamin Barns, MD Department of Hematology/Oncology [facility] Phone: [phone] Pager: [phone] Email: [email or web address]  06/27/2023 9:54 AM

## 2023-06-27 ENCOUNTER — Inpatient Hospital Stay: Payer: BC Managed Care – PPO

## 2023-06-27 ENCOUNTER — Inpatient Hospital Stay: Payer: BC Managed Care – PPO | Attending: Hematology and Oncology | Admitting: Hematology and Oncology

## 2023-06-27 VITALS — BP 142/90 | HR 81 | Temp 97.2°F | Resp 16 | Wt 215.5 lb

## 2023-06-27 DIAGNOSIS — D472 Monoclonal gammopathy: Secondary | ICD-10-CM | POA: Diagnosis not present

## 2023-06-27 DIAGNOSIS — D509 Iron deficiency anemia, unspecified: Secondary | ICD-10-CM | POA: Insufficient documentation

## 2023-06-27 DIAGNOSIS — Z944 Liver transplant status: Secondary | ICD-10-CM | POA: Diagnosis not present

## 2023-06-27 DIAGNOSIS — L03116 Cellulitis of left lower limb: Secondary | ICD-10-CM | POA: Diagnosis not present

## 2023-06-27 DIAGNOSIS — D500 Iron deficiency anemia secondary to blood loss (chronic): Secondary | ICD-10-CM

## 2023-06-27 DIAGNOSIS — K7682 Hepatic encephalopathy: Secondary | ICD-10-CM | POA: Insufficient documentation

## 2023-06-27 DIAGNOSIS — K7460 Unspecified cirrhosis of liver: Secondary | ICD-10-CM | POA: Insufficient documentation

## 2023-06-27 LAB — CBC WITH DIFFERENTIAL (CANCER CENTER ONLY)
Abs Immature Granulocytes: 0.17 10*3/uL — ABNORMAL HIGH (ref 0.00–0.07)
Basophils Absolute: 0.1 10*3/uL (ref 0.0–0.1)
Basophils Relative: 4 %
Eosinophils Absolute: 0.1 10*3/uL (ref 0.0–0.5)
Eosinophils Relative: 3 %
HCT: 34.2 % — ABNORMAL LOW (ref 39.0–52.0)
Hemoglobin: 11.2 g/dL — ABNORMAL LOW (ref 13.0–17.0)
Immature Granulocytes: 4 %
Lymphocytes Relative: 22 %
Lymphs Abs: 0.9 10*3/uL (ref 0.7–4.0)
MCH: 28.1 pg (ref 26.0–34.0)
MCHC: 32.7 g/dL (ref 30.0–36.0)
MCV: 85.9 fL (ref 80.0–100.0)
Monocytes Absolute: 0.9 10*3/uL (ref 0.1–1.0)
Monocytes Relative: 22 %
Neutro Abs: 1.8 10*3/uL (ref 1.7–7.7)
Neutrophils Relative %: 45 %
Platelet Count: 324 10*3/uL (ref 150–400)
RBC: 3.98 MIL/uL — ABNORMAL LOW (ref 4.22–5.81)
RDW: 13.5 % (ref 11.5–15.5)
WBC Count: 3.9 10*3/uL — ABNORMAL LOW (ref 4.0–10.5)
nRBC: 0 % (ref 0.0–0.2)

## 2023-06-27 LAB — CMP (CANCER CENTER ONLY)
ALT: 11 U/L (ref 0–44)
AST: 16 U/L (ref 15–41)
Albumin: 4 g/dL (ref 3.5–5.0)
Alkaline Phosphatase: 84 U/L (ref 38–126)
Anion gap: 6 (ref 5–15)
BUN: 28 mg/dL — ABNORMAL HIGH (ref 6–20)
CO2: 21 mmol/L — ABNORMAL LOW (ref 22–32)
Calcium: 9.3 mg/dL (ref 8.9–10.3)
Chloride: 108 mmol/L (ref 98–111)
Creatinine: 1.9 mg/dL — ABNORMAL HIGH (ref 0.61–1.24)
GFR, Estimated: 40 mL/min — ABNORMAL LOW (ref >60)
Glucose, Bld: 131 mg/dL — ABNORMAL HIGH (ref 70–99)
Potassium: 4.6 mmol/L (ref 3.5–5.1)
Sodium: 135 mmol/L (ref 135–145)
Total Bilirubin: 0.3 mg/dL (ref <1.2)
Total Protein: 6.9 g/dL (ref 6.5–8.1)

## 2023-06-27 LAB — IRON AND IRON BINDING CAPACITY (CC-WL,HP ONLY)
Iron: 41 ug/dL — ABNORMAL LOW (ref 45–182)
Saturation Ratios: 12 % — ABNORMAL LOW (ref 17.9–39.5)
TIBC: 353 ug/dL (ref 250–450)
UIBC: 312 ug/dL (ref 117–376)

## 2023-06-27 LAB — RETIC PANEL
Immature Retic Fract: 22.4 % — ABNORMAL HIGH (ref 2.3–15.9)
RBC.: 3.9 MIL/uL — ABNORMAL LOW (ref 4.22–5.81)
Retic Count, Absolute: 57.7 10*3/uL (ref 19.0–186.0)
Retic Ct Pct: 1.5 % (ref 0.4–3.1)
Reticulocyte Hemoglobin: 27.9 pg — ABNORMAL LOW (ref >27.9)

## 2023-06-27 LAB — FERRITIN: Ferritin: 47 ng/mL (ref 24–336)

## 2023-06-28 DIAGNOSIS — D849 Immunodeficiency, unspecified: Secondary | ICD-10-CM | POA: Diagnosis not present

## 2023-06-28 DIAGNOSIS — Z944 Liver transplant status: Secondary | ICD-10-CM | POA: Diagnosis not present

## 2023-06-28 LAB — KAPPA/LAMBDA LIGHT CHAINS
Kappa free light chain: 20.3 mg/L — ABNORMAL HIGH (ref 3.3–19.4)
Kappa, lambda light chain ratio: 2.57 — ABNORMAL HIGH (ref 0.26–1.65)
Lambda free light chains: 7.9 mg/L (ref 5.7–26.3)

## 2023-06-28 LAB — BETA 2 MICROGLOBULIN, SERUM: Beta-2 Microglobulin: 2.8 mg/L — ABNORMAL HIGH (ref 0.6–2.4)

## 2023-07-07 LAB — MULTIPLE MYELOMA PANEL, SERUM
Albumin SerPl Elph-Mcnc: 3.4 g/dL (ref 2.9–4.4)
Albumin/Glob SerPl: 1.3 (ref 0.7–1.7)
Alpha 1: 0.3 g/dL (ref 0.0–0.4)
Alpha2 Glob SerPl Elph-Mcnc: 0.9 g/dL (ref 0.4–1.0)
B-Globulin SerPl Elph-Mcnc: 1 g/dL (ref 0.7–1.3)
Gamma Glob SerPl Elph-Mcnc: 0.6 g/dL (ref 0.4–1.8)
Globulin, Total: 2.7 g/dL (ref 2.2–3.9)
IgA: 80 mg/dL — ABNORMAL LOW (ref 90–386)
IgG (Immunoglobin G), Serum: 668 mg/dL (ref 603–1613)
IgM (Immunoglobulin M), Srm: 26 mg/dL (ref 20–172)
M Protein SerPl Elph-Mcnc: 0.3 g/dL — ABNORMAL HIGH
Total Protein ELP: 6.1 g/dL (ref 6.0–8.5)

## 2023-07-08 DIAGNOSIS — Z944 Liver transplant status: Secondary | ICD-10-CM | POA: Diagnosis not present

## 2023-07-08 DIAGNOSIS — D849 Immunodeficiency, unspecified: Secondary | ICD-10-CM | POA: Diagnosis not present

## 2023-07-15 DIAGNOSIS — Z28310 Unvaccinated for covid-19: Secondary | ICD-10-CM | POA: Diagnosis not present

## 2023-07-15 DIAGNOSIS — Z7982 Long term (current) use of aspirin: Secondary | ICD-10-CM | POA: Diagnosis not present

## 2023-07-15 DIAGNOSIS — E66811 Obesity, class 1: Secondary | ICD-10-CM | POA: Diagnosis not present

## 2023-07-15 DIAGNOSIS — Z23 Encounter for immunization: Secondary | ICD-10-CM | POA: Diagnosis not present

## 2023-07-15 DIAGNOSIS — Z6831 Body mass index (BMI) 31.0-31.9, adult: Secondary | ICD-10-CM | POA: Diagnosis not present

## 2023-07-15 DIAGNOSIS — Z79621 Long term (current) use of calcineurin inhibitor: Secondary | ICD-10-CM | POA: Diagnosis not present

## 2023-07-15 DIAGNOSIS — D84821 Immunodeficiency due to drugs: Secondary | ICD-10-CM | POA: Diagnosis not present

## 2023-07-15 DIAGNOSIS — Z4824 Encounter for aftercare following lung transplant: Secondary | ICD-10-CM | POA: Diagnosis not present

## 2023-07-15 DIAGNOSIS — Z79624 Long term (current) use of inhibitors of nucleotide synthesis: Secondary | ICD-10-CM | POA: Diagnosis not present

## 2023-07-15 DIAGNOSIS — Z944 Liver transplant status: Secondary | ICD-10-CM | POA: Diagnosis not present

## 2023-07-15 DIAGNOSIS — N189 Chronic kidney disease, unspecified: Secondary | ICD-10-CM | POA: Diagnosis not present

## 2023-07-15 DIAGNOSIS — K900 Celiac disease: Secondary | ICD-10-CM | POA: Diagnosis not present

## 2023-07-15 DIAGNOSIS — F1011 Alcohol abuse, in remission: Secondary | ICD-10-CM | POA: Diagnosis not present

## 2023-07-15 DIAGNOSIS — R7989 Other specified abnormal findings of blood chemistry: Secondary | ICD-10-CM | POA: Diagnosis not present

## 2023-07-15 DIAGNOSIS — R21 Rash and other nonspecific skin eruption: Secondary | ICD-10-CM | POA: Diagnosis not present

## 2023-07-15 DIAGNOSIS — L97829 Non-pressure chronic ulcer of other part of left lower leg with unspecified severity: Secondary | ICD-10-CM | POA: Diagnosis not present

## 2023-07-15 DIAGNOSIS — E878 Other disorders of electrolyte and fluid balance, not elsewhere classified: Secondary | ICD-10-CM | POA: Diagnosis not present

## 2023-07-15 DIAGNOSIS — S8992XA Unspecified injury of left lower leg, initial encounter: Secondary | ICD-10-CM | POA: Diagnosis not present

## 2023-07-15 DIAGNOSIS — D849 Immunodeficiency, unspecified: Secondary | ICD-10-CM | POA: Diagnosis not present

## 2023-07-21 DIAGNOSIS — R21 Rash and other nonspecific skin eruption: Secondary | ICD-10-CM | POA: Diagnosis not present

## 2023-07-21 DIAGNOSIS — J988 Other specified respiratory disorders: Secondary | ICD-10-CM | POA: Diagnosis not present

## 2023-07-21 DIAGNOSIS — L02419 Cutaneous abscess of limb, unspecified: Secondary | ICD-10-CM | POA: Diagnosis not present

## 2023-07-29 DIAGNOSIS — I890 Lymphedema, not elsewhere classified: Secondary | ICD-10-CM | POA: Diagnosis not present

## 2023-07-29 DIAGNOSIS — L02416 Cutaneous abscess of left lower limb: Secondary | ICD-10-CM | POA: Diagnosis not present

## 2023-07-29 DIAGNOSIS — I872 Venous insufficiency (chronic) (peripheral): Secondary | ICD-10-CM | POA: Diagnosis not present

## 2023-07-29 DIAGNOSIS — T148XXA Other injury of unspecified body region, initial encounter: Secondary | ICD-10-CM | POA: Diagnosis not present

## 2023-07-31 DIAGNOSIS — D849 Immunodeficiency, unspecified: Secondary | ICD-10-CM | POA: Diagnosis not present

## 2023-07-31 DIAGNOSIS — Z944 Liver transplant status: Secondary | ICD-10-CM | POA: Diagnosis not present

## 2023-08-07 DIAGNOSIS — F4320 Adjustment disorder, unspecified: Secondary | ICD-10-CM | POA: Diagnosis not present

## 2023-08-09 DIAGNOSIS — L02416 Cutaneous abscess of left lower limb: Secondary | ICD-10-CM | POA: Diagnosis not present

## 2023-08-09 DIAGNOSIS — T148XXA Other injury of unspecified body region, initial encounter: Secondary | ICD-10-CM | POA: Diagnosis not present

## 2023-08-09 DIAGNOSIS — X58XXXA Exposure to other specified factors, initial encounter: Secondary | ICD-10-CM | POA: Diagnosis not present

## 2023-08-09 DIAGNOSIS — I872 Venous insufficiency (chronic) (peripheral): Secondary | ICD-10-CM | POA: Diagnosis not present

## 2023-08-13 DIAGNOSIS — I872 Venous insufficiency (chronic) (peripheral): Secondary | ICD-10-CM | POA: Diagnosis not present

## 2023-08-16 DIAGNOSIS — D849 Immunodeficiency, unspecified: Secondary | ICD-10-CM | POA: Diagnosis not present

## 2023-08-16 DIAGNOSIS — Z944 Liver transplant status: Secondary | ICD-10-CM | POA: Diagnosis not present

## 2023-08-23 DIAGNOSIS — D849 Immunodeficiency, unspecified: Secondary | ICD-10-CM | POA: Diagnosis not present

## 2023-08-23 DIAGNOSIS — Z944 Liver transplant status: Secondary | ICD-10-CM | POA: Diagnosis not present

## 2023-08-30 DIAGNOSIS — Z20828 Contact with and (suspected) exposure to other viral communicable diseases: Secondary | ICD-10-CM | POA: Diagnosis not present

## 2023-08-30 DIAGNOSIS — D849 Immunodeficiency, unspecified: Secondary | ICD-10-CM | POA: Diagnosis not present

## 2023-08-30 DIAGNOSIS — L02416 Cutaneous abscess of left lower limb: Secondary | ICD-10-CM | POA: Diagnosis not present

## 2023-08-30 DIAGNOSIS — Z944 Liver transplant status: Secondary | ICD-10-CM | POA: Diagnosis not present

## 2023-09-13 DIAGNOSIS — D849 Immunodeficiency, unspecified: Secondary | ICD-10-CM | POA: Diagnosis not present

## 2023-09-13 DIAGNOSIS — Z944 Liver transplant status: Secondary | ICD-10-CM | POA: Diagnosis not present

## 2023-09-16 DIAGNOSIS — D849 Immunodeficiency, unspecified: Secondary | ICD-10-CM | POA: Diagnosis not present

## 2023-09-16 DIAGNOSIS — N189 Chronic kidney disease, unspecified: Secondary | ICD-10-CM | POA: Diagnosis not present

## 2023-09-16 DIAGNOSIS — R635 Abnormal weight gain: Secondary | ICD-10-CM | POA: Diagnosis not present

## 2023-09-16 DIAGNOSIS — Z944 Liver transplant status: Secondary | ICD-10-CM | POA: Diagnosis not present

## 2023-09-25 DIAGNOSIS — F4320 Adjustment disorder, unspecified: Secondary | ICD-10-CM | POA: Diagnosis not present

## 2023-09-27 DIAGNOSIS — D849 Immunodeficiency, unspecified: Secondary | ICD-10-CM | POA: Diagnosis not present

## 2023-09-27 DIAGNOSIS — Z944 Liver transplant status: Secondary | ICD-10-CM | POA: Diagnosis not present

## 2023-10-04 DIAGNOSIS — Z944 Liver transplant status: Secondary | ICD-10-CM | POA: Diagnosis not present

## 2023-10-04 DIAGNOSIS — Z2839 Other underimmunization status: Secondary | ICD-10-CM | POA: Diagnosis not present

## 2023-10-04 DIAGNOSIS — F1011 Alcohol abuse, in remission: Secondary | ICD-10-CM | POA: Diagnosis not present

## 2023-10-25 DIAGNOSIS — Z944 Liver transplant status: Secondary | ICD-10-CM | POA: Diagnosis not present

## 2023-10-25 DIAGNOSIS — D849 Immunodeficiency, unspecified: Secondary | ICD-10-CM | POA: Diagnosis not present

## 2023-10-25 DIAGNOSIS — D649 Anemia, unspecified: Secondary | ICD-10-CM | POA: Diagnosis not present

## 2023-11-18 DIAGNOSIS — E875 Hyperkalemia: Secondary | ICD-10-CM | POA: Diagnosis not present

## 2023-11-18 DIAGNOSIS — Z944 Liver transplant status: Secondary | ICD-10-CM | POA: Diagnosis not present

## 2023-11-18 DIAGNOSIS — D849 Immunodeficiency, unspecified: Secondary | ICD-10-CM | POA: Diagnosis not present

## 2023-11-18 DIAGNOSIS — D500 Iron deficiency anemia secondary to blood loss (chronic): Secondary | ICD-10-CM | POA: Diagnosis not present

## 2023-12-03 DIAGNOSIS — I872 Venous insufficiency (chronic) (peripheral): Secondary | ICD-10-CM | POA: Diagnosis not present

## 2023-12-06 DIAGNOSIS — D849 Immunodeficiency, unspecified: Secondary | ICD-10-CM | POA: Diagnosis not present

## 2023-12-06 DIAGNOSIS — Z944 Liver transplant status: Secondary | ICD-10-CM | POA: Diagnosis not present

## 2023-12-12 DIAGNOSIS — L02416 Cutaneous abscess of left lower limb: Secondary | ICD-10-CM | POA: Diagnosis not present

## 2023-12-12 DIAGNOSIS — I890 Lymphedema, not elsewhere classified: Secondary | ICD-10-CM | POA: Diagnosis not present

## 2023-12-12 DIAGNOSIS — L97909 Non-pressure chronic ulcer of unspecified part of unspecified lower leg with unspecified severity: Secondary | ICD-10-CM | POA: Diagnosis not present

## 2023-12-13 DIAGNOSIS — D849 Immunodeficiency, unspecified: Secondary | ICD-10-CM | POA: Diagnosis not present

## 2023-12-13 DIAGNOSIS — Z944 Liver transplant status: Secondary | ICD-10-CM | POA: Diagnosis not present

## 2023-12-13 DIAGNOSIS — B259 Cytomegaloviral disease, unspecified: Secondary | ICD-10-CM | POA: Diagnosis not present

## 2023-12-20 DIAGNOSIS — D849 Immunodeficiency, unspecified: Secondary | ICD-10-CM | POA: Diagnosis not present

## 2023-12-20 DIAGNOSIS — Z944 Liver transplant status: Secondary | ICD-10-CM | POA: Diagnosis not present

## 2023-12-24 DIAGNOSIS — B259 Cytomegaloviral disease, unspecified: Secondary | ICD-10-CM | POA: Diagnosis not present

## 2023-12-24 DIAGNOSIS — D849 Immunodeficiency, unspecified: Secondary | ICD-10-CM | POA: Diagnosis not present

## 2023-12-25 DIAGNOSIS — B259 Cytomegaloviral disease, unspecified: Secondary | ICD-10-CM | POA: Diagnosis not present

## 2023-12-26 ENCOUNTER — Inpatient Hospital Stay: Payer: BC Managed Care – PPO

## 2023-12-26 ENCOUNTER — Inpatient Hospital Stay: Payer: BC Managed Care – PPO | Admitting: Hematology and Oncology

## 2023-12-26 DIAGNOSIS — B259 Cytomegaloviral disease, unspecified: Secondary | ICD-10-CM | POA: Diagnosis not present

## 2023-12-27 DIAGNOSIS — D849 Immunodeficiency, unspecified: Secondary | ICD-10-CM | POA: Diagnosis not present

## 2023-12-27 DIAGNOSIS — B259 Cytomegaloviral disease, unspecified: Secondary | ICD-10-CM | POA: Diagnosis not present

## 2023-12-29 DIAGNOSIS — B259 Cytomegaloviral disease, unspecified: Secondary | ICD-10-CM | POA: Diagnosis not present

## 2023-12-31 DIAGNOSIS — B259 Cytomegaloviral disease, unspecified: Secondary | ICD-10-CM | POA: Diagnosis not present

## 2024-01-01 DIAGNOSIS — D849 Immunodeficiency, unspecified: Secondary | ICD-10-CM | POA: Diagnosis not present

## 2024-01-01 DIAGNOSIS — Z944 Liver transplant status: Secondary | ICD-10-CM | POA: Diagnosis not present

## 2024-01-02 DIAGNOSIS — B259 Cytomegaloviral disease, unspecified: Secondary | ICD-10-CM | POA: Diagnosis not present

## 2024-01-03 DIAGNOSIS — B259 Cytomegaloviral disease, unspecified: Secondary | ICD-10-CM | POA: Diagnosis not present

## 2024-01-04 DIAGNOSIS — B259 Cytomegaloviral disease, unspecified: Secondary | ICD-10-CM | POA: Diagnosis not present

## 2024-01-05 DIAGNOSIS — B259 Cytomegaloviral disease, unspecified: Secondary | ICD-10-CM | POA: Diagnosis not present

## 2024-01-06 DIAGNOSIS — B259 Cytomegaloviral disease, unspecified: Secondary | ICD-10-CM | POA: Diagnosis not present

## 2024-01-07 DIAGNOSIS — B259 Cytomegaloviral disease, unspecified: Secondary | ICD-10-CM | POA: Diagnosis not present

## 2024-01-08 DIAGNOSIS — B259 Cytomegaloviral disease, unspecified: Secondary | ICD-10-CM | POA: Diagnosis not present

## 2024-01-09 DIAGNOSIS — B259 Cytomegaloviral disease, unspecified: Secondary | ICD-10-CM | POA: Diagnosis not present

## 2024-01-10 DIAGNOSIS — D849 Immunodeficiency, unspecified: Secondary | ICD-10-CM | POA: Diagnosis not present

## 2024-01-10 DIAGNOSIS — Z944 Liver transplant status: Secondary | ICD-10-CM | POA: Diagnosis not present

## 2024-01-11 DIAGNOSIS — B259 Cytomegaloviral disease, unspecified: Secondary | ICD-10-CM | POA: Diagnosis not present

## 2024-01-12 DIAGNOSIS — B259 Cytomegaloviral disease, unspecified: Secondary | ICD-10-CM | POA: Diagnosis not present

## 2024-01-13 DIAGNOSIS — B259 Cytomegaloviral disease, unspecified: Secondary | ICD-10-CM | POA: Diagnosis not present

## 2024-01-13 DIAGNOSIS — I890 Lymphedema, not elsewhere classified: Secondary | ICD-10-CM | POA: Diagnosis not present

## 2024-01-13 DIAGNOSIS — L97909 Non-pressure chronic ulcer of unspecified part of unspecified lower leg with unspecified severity: Secondary | ICD-10-CM | POA: Diagnosis not present

## 2024-01-13 NOTE — Progress Notes (Signed)
  Division of Vascular and Endovascular Surgery [facility]   Date of Service:  01/13/2024 Time:  4:08 PM  Principal Diagnosis:   1. Superficial ulcer of lower extremity (CMS/HHS-HCC)   2. Lymphedema of lower extremity     Vital Signs:  BP 127/78 (BP Location: Right upper arm, Patient Position: Sitting, BP Cuff Size: Large Adult)   Pulse 101   Temp 36.6 C (97.8 F) (Oral)   Ht 175.3 cm (5' 9)   Wt (!) 106.8 kg (235 lb 7.2 oz)   SpO2 97%   BMI 34.77 kg/m   SUBJECTIVE: Benjamin Knight is about 6 weeks out from ablation left great saphenous vein has had 2 weeks now of increasing swelling of the left leg it is not painful.  He wears a stocking gets a sort of tourniquet effect but is not helping a whole lot with the swelling.  OBJECTIVE: He has marked edema of the leg extending up to the knee.  No open lesions are noted.  I scanned what I could I did not see evidence at the saphenofemoral junction are thrombosed in order to see problems in the popliteal vein but quite honestly I cannot see the femoral vein and unfortunately attacks are gone for the day.  ASSESSMENT: I do not think he has DVT but I cannot be absolutely certain he just needs a venous study discussed choices and options may be easier to try to get something done locally tomorrow and I will work on that.  If that is negative we will get him in for lymphedema therapy.  PLAN: I will try get him an ultrasound and I will call his wife about the arrangements.  Her number is [phone]  MEDICAL DECISION MAKING  Problems addressed during this examination involve an established problem that is worsening-2 pts.    During this office visit I was required to Review and/or order radiology studies-1pt, Review/Summarize old records, hx from someone other than patient, summarize discussion with other provider-2pts, and perform an independent visualization/review of images-2pts  The risk of complications and/or morbidity or  mortality associated with treating this patient is low.    This office visit requires medical decision making of low-2 pts complexity.    ATTESTATION:   Attestation Statement:   I personally performed the service. (TP)  [facility] GRETTA MOULD, MD     Dr. Zachary ONEIDA GRETTA III MD

## 2024-01-14 ENCOUNTER — Other Ambulatory Visit: Payer: Self-pay | Admitting: Vascular Surgery

## 2024-01-14 ENCOUNTER — Ambulatory Visit
Admission: RE | Admit: 2024-01-14 | Discharge: 2024-01-14 | Disposition: A | Discharge status: Home / Self Care | Source: Ambulatory Visit | Attending: Vascular Surgery | Admitting: Vascular Surgery

## 2024-01-14 DIAGNOSIS — Z92241 Personal history of systemic steroid therapy: Secondary | ICD-10-CM

## 2024-01-14 DIAGNOSIS — L97909 Non-pressure chronic ulcer of unspecified part of unspecified lower leg with unspecified severity: Secondary | ICD-10-CM

## 2024-01-14 DIAGNOSIS — B259 Cytomegaloviral disease, unspecified: Secondary | ICD-10-CM | POA: Diagnosis not present

## 2024-01-14 DIAGNOSIS — I82492 Acute embolism and thrombosis of other specified deep vein of left lower extremity: Secondary | ICD-10-CM | POA: Diagnosis not present

## 2024-01-14 DIAGNOSIS — I82442 Acute embolism and thrombosis of left tibial vein: Secondary | ICD-10-CM | POA: Diagnosis not present

## 2024-01-14 DIAGNOSIS — I8012 Phlebitis and thrombophlebitis of left femoral vein: Secondary | ICD-10-CM

## 2024-01-15 DIAGNOSIS — B259 Cytomegaloviral disease, unspecified: Secondary | ICD-10-CM | POA: Diagnosis not present

## 2024-01-16 ENCOUNTER — Inpatient Hospital Stay: Attending: Hematology and Oncology

## 2024-01-16 ENCOUNTER — Inpatient Hospital Stay (HOSPITAL_BASED_OUTPATIENT_CLINIC_OR_DEPARTMENT_OTHER): Admitting: Hematology and Oncology

## 2024-01-16 ENCOUNTER — Other Ambulatory Visit: Payer: Self-pay | Admitting: Hematology and Oncology

## 2024-01-16 VITALS — BP 120/90 | HR 87 | Temp 98.2°F | Resp 15 | Wt 241.3 lb

## 2024-01-16 DIAGNOSIS — D509 Iron deficiency anemia, unspecified: Secondary | ICD-10-CM | POA: Diagnosis not present

## 2024-01-16 DIAGNOSIS — R768 Other specified abnormal immunological findings in serum: Secondary | ICD-10-CM | POA: Diagnosis not present

## 2024-01-16 DIAGNOSIS — K862 Cyst of pancreas: Secondary | ICD-10-CM | POA: Diagnosis not present

## 2024-01-16 DIAGNOSIS — K7460 Unspecified cirrhosis of liver: Secondary | ICD-10-CM | POA: Diagnosis not present

## 2024-01-16 DIAGNOSIS — D849 Immunodeficiency, unspecified: Secondary | ICD-10-CM | POA: Diagnosis not present

## 2024-01-16 DIAGNOSIS — D500 Iron deficiency anemia secondary to blood loss (chronic): Secondary | ICD-10-CM

## 2024-01-16 DIAGNOSIS — B259 Cytomegaloviral disease, unspecified: Secondary | ICD-10-CM | POA: Diagnosis not present

## 2024-01-16 DIAGNOSIS — L03116 Cellulitis of left lower limb: Secondary | ICD-10-CM | POA: Diagnosis not present

## 2024-01-16 DIAGNOSIS — D472 Monoclonal gammopathy: Secondary | ICD-10-CM

## 2024-01-16 DIAGNOSIS — L02416 Cutaneous abscess of left lower limb: Secondary | ICD-10-CM | POA: Diagnosis not present

## 2024-01-16 DIAGNOSIS — Z944 Liver transplant status: Secondary | ICD-10-CM | POA: Diagnosis not present

## 2024-01-16 LAB — IRON AND IRON BINDING CAPACITY (CC-WL,HP ONLY)
Iron: 48 ug/dL (ref 45–182)
Saturation Ratios: 12 % — ABNORMAL LOW (ref 17.9–39.5)
TIBC: 395 ug/dL (ref 250–450)
UIBC: 347 ug/dL (ref 117–376)

## 2024-01-16 LAB — RETIC PANEL
Immature Retic Fract: 28.5 % — ABNORMAL HIGH (ref 2.3–15.9)
RBC.: 4.59 MIL/uL (ref 4.22–5.81)
Retic Count, Absolute: 71.6 K/uL (ref 19.0–186.0)
Retic Ct Pct: 1.6 % (ref 0.4–3.1)
Reticulocyte Hemoglobin: 30.4 pg (ref >27.9)

## 2024-01-16 LAB — CBC WITH DIFFERENTIAL (CANCER CENTER ONLY)
Abs Immature Granulocytes: 0.06 K/uL (ref 0.00–0.07)
Basophils Absolute: 0.1 K/uL (ref 0.0–0.1)
Basophils Relative: 2 %
Eosinophils Absolute: 0.1 K/uL (ref 0.0–0.5)
Eosinophils Relative: 1 %
HCT: 36.2 % — ABNORMAL LOW (ref 39.0–52.0)
Hemoglobin: 11.7 g/dL — ABNORMAL LOW (ref 13.0–17.0)
Immature Granulocytes: 1 %
Lymphocytes Relative: 26 %
Lymphs Abs: 1.7 K/uL (ref 0.7–4.0)
MCH: 25.1 pg — ABNORMAL LOW (ref 26.0–34.0)
MCHC: 32.3 g/dL (ref 30.0–36.0)
MCV: 77.7 fL — ABNORMAL LOW (ref 80.0–100.0)
Monocytes Absolute: 0.7 K/uL (ref 0.1–1.0)
Monocytes Relative: 10 %
Neutro Abs: 3.9 K/uL (ref 1.7–7.7)
Neutrophils Relative %: 60 %
Platelet Count: 279 K/uL (ref 150–400)
RBC: 4.66 MIL/uL (ref 4.22–5.81)
RDW: 20.4 % — ABNORMAL HIGH (ref 11.5–15.5)
WBC Count: 6.6 K/uL (ref 4.0–10.5)
nRBC: 0 % (ref 0.0–0.2)

## 2024-01-16 LAB — CMP (CANCER CENTER ONLY)
ALT: 33 U/L (ref 0–44)
AST: 24 U/L (ref 15–41)
Albumin: 3.9 g/dL (ref 3.5–5.0)
Alkaline Phosphatase: 107 U/L (ref 38–126)
Anion gap: 7 (ref 5–15)
BUN: 23 mg/dL — ABNORMAL HIGH (ref 6–20)
CO2: 25 mmol/L (ref 22–32)
Calcium: 8.8 mg/dL — ABNORMAL LOW (ref 8.9–10.3)
Chloride: 107 mmol/L (ref 98–111)
Creatinine: 1.42 mg/dL — ABNORMAL HIGH (ref 0.61–1.24)
GFR, Estimated: 57 mL/min — ABNORMAL LOW (ref >60)
Glucose, Bld: 160 mg/dL — ABNORMAL HIGH (ref 70–99)
Potassium: 4.4 mmol/L (ref 3.5–5.1)
Sodium: 139 mmol/L (ref 135–145)
Total Bilirubin: 0.3 mg/dL (ref 0.0–1.2)
Total Protein: 6.7 g/dL (ref 6.5–8.1)

## 2024-01-16 LAB — FERRITIN: Ferritin: 21 ng/mL — ABNORMAL LOW (ref 24–336)

## 2024-01-16 NOTE — Progress Notes (Signed)
 [facility] [phone]   [phone]  PROGRESS NOTE  Patient Care Team: Alys Schuyler HERO, PA as PCP - General (Internal Medicine)  Hematological/Oncological History # IgG Kappa Monoclonal Gammopathy of Undetermined Significance # Iron  Deficiency Anemia  1) 03/31/2021:  -SPEP detected M-protein 1.2 g/dL. Serum free light chains showed kappa light chain 109.8 mg/L, lambda light chains 35.1 mg/L and elevated ratio of 3.13.  -Vitamin B12 level 1,095, Ferritin 27, Iron  72, TIBC 514 (H), Iron  saturation 14% (L), folate 12.8.   2) 04/01/2021: WBC 10.6 (H), Hgb 10.5 (L), MCV 82.7, Plt 228, Creatinine 1.15, Calcium  8.5 (L).    3) 04/21/2021: Establish care with [facility] Hematology/Oncology  Interval History:  Benjamin Knight 58 y.o. male with medical history significant for MGUS and iron  deficiency anemia who presents for a follow up visit. The patient's last visit was on 06/27/2023.  In the interim since his last visit he has had no major changes in his health.  On exam today Benjamin Knight reports he has been well overall in the room since our last visit though he has had nonstop doctor visits.  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  He reports that he has good energy today at 8 out of 10.  He reports he is not having any trouble with bleeding, bruising, or dark stools after he started Eliquis therapy for clots in his calf.  He reports that he has had no infectious symptoms such as fevers, chills, sweats, nausea, vomiting or diarrhea.  He denies any lightheadedness, dizziness, or shortness of breath.  He reports that he has been eating well.  He is taking iron  supplementation every other day p.o.  He notes it is not causing any constipation or other stomach upset.  Overall he feels well and is optimistic he will get his PICC line removed soon when he gets his second negative test for CMV.  A full 10 point ROS is otherwise  negative.  MEDICAL HISTORY:  Past Medical History:  Diagnosis Date   Alcoholic cirrhosis (HCC)    Bipolar disorder (HCC)    Chronic back pain    Cirrhosis (HCC)    Depression    Esophageal varices (HCC)    Portal hypertensive gastropathy (HCC)     SURGICAL HISTORY: Past Surgical History:  Procedure Laterality Date   APPENDECTOMY      SOCIAL HISTORY: Social History   Socioeconomic History   Marital status: Married    Spouse name: Not on file   Number of children: Not on file   Years of education: Not on file   Highest education level: Not on file  Occupational History   Not on file  Tobacco Use   Smoking status: Never   Smokeless tobacco: Current    Types: Chew  Substance and Sexual Activity   Alcohol use: Yes    Comment: Per wife, he has an extensive history of alcohol use with prior attempts of quitting. However, recently noted to be drinking up to 1/5 liquor on weekends and half of 1/5 liquor daily on weekdays until Labor day weekend.   Drug use: Never   Sexual activity: Not on file  Other Topics Concern   Not on file  Social History Narrative   Not on file   Social Drivers of Health   Financial Resource Strain: Low Risk  (02/12/2023)   Received from [facility]   Overall Financial Resource  Strain (CARDIA)    Difficulty of Paying Living Expenses: Not hard at all  Food Insecurity: No Food Insecurity (02/12/2023)   Received from [facility]   Hunger Vital Sign    Within the past 12 months, you worried that your food would run out before you got the money to buy more.: Never true    Within the past 12 months, the food you bought just didn't last and you didn't have money to get more.: Never true  Transportation Needs: No Transportation Needs (02/12/2023)   Received from [facility] - Transportation    In the past 12 months, has lack of transportation kept you from medical appointments or from  getting medications?: No    Lack of Transportation (Non-Medical): No  Physical Activity: Not on file  Stress: Not on file  Social Connections: Not on file  Intimate Partner Violence: Not on file    FAMILY HISTORY: Family History  Problem Relation Age of Onset   Heart disease Mother     ALLERGIES:  is allergic to barley grass, other, sertraline, and wheat.  MEDICATIONS:  Current Outpatient Medications  Medication Sig Dispense Refill   acetaminophen  (TYLENOL ) 325 MG tablet Take by mouth.     aspirin EC 81 MG tablet Take 81 mg by mouth daily.     Calcium  Citrate-Vitamin D 315-5 MG-MCG TABS Take by mouth.     famotidine  (PEPCID ) 20 MG tablet Take by mouth.     melatonin 3 MG TABS tablet Take 3 mg by mouth at bedtime as needed.     mycophenolate (CELLCEPT) 250 MG capsule Take 1,000 mg by mouth 2 (two) times daily.     Specialty Vitamins Products (MG PLUS PROTEIN) 133 MG TABS Take by mouth.     sulfamethoxazole-trimethoprim (BACTRIM DS) 800-160 MG tablet Take 1 tablet by mouth 2 (two) times daily.     tacrolimus  (PROGRAF ) 0.5 MG capsule Take 0.5 mg by mouth. 1.5 mg I am and 2mg  at night     traZODone (DESYREL) 50 MG tablet Take 1 tablet by mouth at bedtime.     triamcinolone  cream (KENALOG ) 0.1 % Apply topically.     valGANciclovir (VALCYTE) 450 MG tablet Take 900 mg by mouth daily.     No current facility-administered medications for this visit.    REVIEW OF SYSTEMS:   Constitutional: ( - ) fevers, ( - )  chills , ( - ) night sweats Eyes: ( - ) blurriness of vision, ( - ) double vision, ( - ) watery eyes Ears, nose, mouth, throat, and face: ( - ) mucositis, ( - ) sore throat Respiratory: ( - ) cough, ( - ) dyspnea, ( - ) wheezes Cardiovascular: ( - ) palpitation, ( - ) chest discomfort, ( - ) lower extremity swelling Gastrointestinal:  ( - ) nausea, ( - ) heartburn, ( - ) change in bowel habits Skin: ( - ) abnormal skin rashes Lymphatics: ( - ) new lymphadenopathy, ( - ) easy  bruising Neurological: ( - ) numbness, ( - ) tingling, ( - ) new weaknesses Behavioral/Psych: ( - ) mood change, ( - ) new changes  All other systems were reviewed with the patient and are negative.  PHYSICAL EXAMINATION: ECOG PERFORMANCE STATUS: 1 - Symptomatic but completely ambulatory  Vitals:   01/16/24 0844  BP: (!) 120/90  Pulse: 87  Resp: 15  Temp: 98.2 F (36.8 C)  SpO2: 96%  Filed Weights   01/16/24 0844  Weight: 241 lb 4.8 oz (109.5 kg)        GENERAL: Well-appearing middle-age Caucasian male, alert, no distress and comfortable SKIN: skin color, texture, turgor are normal, no rashes or significant lesions EYES: conjunctiva are pink and non-injected, sclera clear LUNGS: clear to auscultation and percussion with normal breathing effort HEART: regular rate & rhythm and no murmurs and no lower extremity edema Musculoskeletal: no cyanosis of digits and no clubbing  PSYCH: alert & oriented x 3, fluent speech NEURO: no focal motor/sensory deficits  LABORATORY DATA:  I have reviewed the data as listed    Latest Ref Rng & Units 01/16/2024    8:07 AM 06/27/2023    9:30 AM 12/27/2022    6:21 PM  CBC  WBC 4.0 - 10.5 K/uL 6.6  3.9    Hemoglobin 13.0 - 17.0 g/dL 88.2  88.7  89.0   Hematocrit 39.0 - 52.0 % 36.2  34.2  32.0   Platelets 150 - 400 K/uL 279  324         Latest Ref Rng & Units 01/16/2024    8:07 AM 06/27/2023    9:30 AM 12/27/2022    6:21 PM  CMP  Glucose 70 - 99 mg/dL 839  868    BUN 6 - 20 mg/dL 23  28    Creatinine 9.38 - 1.24 mg/dL 8.57  8.09    Sodium 864 - 145 mmol/L 139  135  144   Potassium 3.5 - 5.1 mmol/L 4.4  4.6  2.6   Chloride 98 - 111 mmol/L 107  108    CO2 22 - 32 mmol/L 25  21    Calcium  8.9 - 10.3 mg/dL 8.8  9.3    Total Protein 6.5 - 8.1 g/dL 6.7  6.9    Total Bilirubin 0.0 - 1.2 mg/dL 0.3  0.3    Alkaline Phos 38 - 126 U/L 107  84    AST 15 - 41 U/L 24  16    ALT 0 - 44 U/L 33  11      Lab Results  Component Value  Date   MPROTEIN 0.3 (H) 06/27/2023   MPROTEIN 0.8 (H) 08/30/2022   MPROTEIN 1.1 (H) 12/25/2021   Lab Results  Component Value Date   KPAFRELGTCHN 20.3 (H) 06/27/2023   KPAFRELGTCHN 87.3 (H) 08/30/2022   KPAFRELGTCHN 75.4 (H) 12/25/2021   LAMBDASER 7.9 06/27/2023   LAMBDASER 30.7 (H) 08/30/2022   LAMBDASER 28.8 (H) 12/25/2021   KAPLAMBRATIO 2.57 (H) 06/27/2023   KAPLAMBRATIO 2.84 (H) 08/30/2022   KAPLAMBRATIO 2.62 (H) 12/25/2021    RADIOGRAPHIC STUDIES: US  Venous Img Lower Unilateral Left (DVT) Result Date: 01/14/2024 CLINICAL DATA:  Lower leg superficial ulcer x3 weeks, edema, previous GSV ablation EXAM: LEFT LOWER EXTREMITY VENOUS DOPPLER ULTRASOUND TECHNIQUE: Gray-scale sonography with compression, as well as color and duplex ultrasound, were performed to evaluate the deep venous system(s) from the level of the common femoral vein through the popliteal and proximal calf veins. COMPARISON:  None Available. FINDINGS: VENOUS Normal compressibility of the common femoral, superficial femoral, and popliteal veins. Visualized portions of profunda femoral vein and great saphenous vein unremarkable. Great saphenous vein occluded and noncompressible from the mid thigh to the proximal calf. Noncompressible hypoechoic obstructive thrombus in the left gastroc and posterior tibial veins. No flow signal on color Doppler in the proximal posterior tibial, monophasic waveform distally. Limited views of the contralateral common femoral vein are unremarkable. OTHER Subcutaneous edema  in the calf. Limitations: none IMPRESSION: 1. POSITIVE for left  gastroc and posterior tibial (calf) DVT. 2. Occluded left great saphenous vein from the mid thigh to the proximal calf. Electronically Signed   By: JONETTA Faes M.D.   On: 01/14/2024 15:58    ASSESSMENT & PLAN ZVI DUPLANTIS 58 y.o. male with medical history significant for MGUS and iron  deficiency anemia who presents for a follow up visit.   #Monoclonal gammopathy  of Uncertain Significance: --repeat CBC, CMP, SPEP/IFE,  kappa/lambda light chain q 6 months with UPEP and met survey yearly.   --DG bone met survey showed no clear lytic lesions.  --bone marrow biopsy performed due to elevated serum free light chain ratio. Performed on 05/04/2021, showed 4% plasma cells. Did not meet criteria for Multiple myeloma.   --M protein 0.3 with SFLC ratio at 2.57 at last check on 06/27/2023.  Labs pending from today. --RTC in 6 months    #Cellulitis -- Patient notes that the cellulitis on his left leg is improving and draining.  He is on Bactrim. -- Strict return precautions if he were to develop any fevers, chills, sweats or if cellulitis were to worsen rather than improve.  # Cirrhosis: --Diagnosed recently in September 2022 after presenting with AMS due to acute hepatic encephalopathy.  --Currently on Lactulose  TID. 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    #Iron  deficiency Anemia of Uncertain Etiology: --labs today show white blood cell 6.6, Hgb 11.7, MCV 77.7, Plt 279  -- continue ferrous sulfate  325 mg every other day  -- recieved IV iron  sucrose 200 mg q. 7 days x 5 doses from 05/19/21-06/27/2021. --EGD performed on 10/20/2021, showed small esophageal varices and portal gastropathy  --RTC in 6 months to assess iron  stores.    No orders of the defined types were placed in this encounter.  All questions were answered. The patient knows to call the clinic with any problems, questions or concerns.  A total of more than 30 minutes were spent on this encounter with face-to-face time and non-face-to-face time, including preparing to see the patient, ordering tests and/or medications, counseling the patient and coordination of care as outlined above.   Norleen IVAR Kidney, MD Department of Hematology/Oncology [facility] Phone: [phone] Pager:  [phone] Email: [email or web address]  01/16/2024 10:12 AM

## 2024-01-17 DIAGNOSIS — L02416 Cutaneous abscess of left lower limb: Secondary | ICD-10-CM | POA: Diagnosis not present

## 2024-01-17 DIAGNOSIS — Z944 Liver transplant status: Secondary | ICD-10-CM | POA: Diagnosis not present

## 2024-01-17 DIAGNOSIS — B259 Cytomegaloviral disease, unspecified: Secondary | ICD-10-CM | POA: Diagnosis not present

## 2024-01-17 DIAGNOSIS — D849 Immunodeficiency, unspecified: Secondary | ICD-10-CM | POA: Diagnosis not present

## 2024-01-17 LAB — KAPPA/LAMBDA LIGHT CHAINS
Kappa free light chain: 34.8 mg/L — ABNORMAL HIGH (ref 3.3–19.4)
Kappa, lambda light chain ratio: 1.68 — ABNORMAL HIGH (ref 0.26–1.65)
Lambda free light chains: 20.7 mg/L (ref 5.7–26.3)

## 2024-01-18 DIAGNOSIS — B259 Cytomegaloviral disease, unspecified: Secondary | ICD-10-CM | POA: Diagnosis not present

## 2024-01-19 DIAGNOSIS — B259 Cytomegaloviral disease, unspecified: Secondary | ICD-10-CM | POA: Diagnosis not present

## 2024-01-20 DIAGNOSIS — B259 Cytomegaloviral disease, unspecified: Secondary | ICD-10-CM | POA: Diagnosis not present

## 2024-01-20 LAB — MULTIPLE MYELOMA PANEL, SERUM
Albumin SerPl Elph-Mcnc: 3.7 g/dL (ref 2.9–4.4)
Albumin/Glob SerPl: 1.4 (ref 0.7–1.7)
Alpha 1: 0.1 g/dL (ref 0.0–0.4)
Alpha2 Glob SerPl Elph-Mcnc: 0.6 g/dL (ref 0.4–1.0)
B-Globulin SerPl Elph-Mcnc: 1 g/dL (ref 0.7–1.3)
Gamma Glob SerPl Elph-Mcnc: 0.9 g/dL (ref 0.4–1.8)
Globulin, Total: 2.7 g/dL (ref 2.2–3.9)
IgA: 152 mg/dL (ref 90–386)
IgG (Immunoglobin G), Serum: 997 mg/dL (ref 603–1613)
IgM (Immunoglobulin M), Srm: 51 mg/dL (ref 20–172)
M Protein SerPl Elph-Mcnc: 0.4 g/dL — ABNORMAL HIGH
Total Protein ELP: 6.4 g/dL (ref 6.0–8.5)

## 2024-01-21 DIAGNOSIS — B259 Cytomegaloviral disease, unspecified: Secondary | ICD-10-CM | POA: Diagnosis not present

## 2024-01-22 DIAGNOSIS — B259 Cytomegaloviral disease, unspecified: Secondary | ICD-10-CM | POA: Diagnosis not present

## 2024-01-24 DIAGNOSIS — B259 Cytomegaloviral disease, unspecified: Secondary | ICD-10-CM | POA: Diagnosis not present

## 2024-01-28 DIAGNOSIS — K862 Cyst of pancreas: Secondary | ICD-10-CM | POA: Diagnosis not present

## 2024-01-28 DIAGNOSIS — D500 Iron deficiency anemia secondary to blood loss (chronic): Secondary | ICD-10-CM | POA: Diagnosis not present

## 2024-01-28 DIAGNOSIS — B259 Cytomegaloviral disease, unspecified: Secondary | ICD-10-CM | POA: Diagnosis not present

## 2024-01-28 DIAGNOSIS — Z20828 Contact with and (suspected) exposure to other viral communicable diseases: Secondary | ICD-10-CM | POA: Diagnosis not present

## 2024-01-28 DIAGNOSIS — F1011 Alcohol abuse, in remission: Secondary | ICD-10-CM | POA: Diagnosis not present

## 2024-01-28 DIAGNOSIS — D849 Immunodeficiency, unspecified: Secondary | ICD-10-CM | POA: Diagnosis not present

## 2024-01-28 DIAGNOSIS — Z944 Liver transplant status: Secondary | ICD-10-CM | POA: Diagnosis not present

## 2024-01-31 ENCOUNTER — Other Ambulatory Visit: Payer: BC Managed Care – PPO

## 2024-02-06 ENCOUNTER — Ambulatory Visit (HOSPITAL_BASED_OUTPATIENT_CLINIC_OR_DEPARTMENT_OTHER)
Admission: RE | Admit: 2024-02-06 | Discharge: 2024-02-06 | Disposition: A | Discharge status: Home / Self Care | Source: Ambulatory Visit | Attending: Physician Assistant | Admitting: Physician Assistant

## 2024-02-06 DIAGNOSIS — M8589 Other specified disorders of bone density and structure, multiple sites: Secondary | ICD-10-CM | POA: Diagnosis not present

## 2024-02-06 DIAGNOSIS — Z92241 Personal history of systemic steroid therapy: Secondary | ICD-10-CM | POA: Insufficient documentation

## 2024-02-14 DIAGNOSIS — D849 Immunodeficiency, unspecified: Secondary | ICD-10-CM | POA: Diagnosis not present

## 2024-02-14 DIAGNOSIS — Z944 Liver transplant status: Secondary | ICD-10-CM | POA: Diagnosis not present

## 2024-02-21 DIAGNOSIS — I872 Venous insufficiency (chronic) (peripheral): Secondary | ICD-10-CM | POA: Diagnosis not present

## 2024-02-21 DIAGNOSIS — Z1283 Encounter for screening for malignant neoplasm of skin: Secondary | ICD-10-CM | POA: Diagnosis not present

## 2024-02-21 DIAGNOSIS — D225 Melanocytic nevi of trunk: Secondary | ICD-10-CM | POA: Diagnosis not present

## 2024-02-27 DIAGNOSIS — Z944 Liver transplant status: Secondary | ICD-10-CM | POA: Diagnosis not present

## 2024-02-27 DIAGNOSIS — D849 Immunodeficiency, unspecified: Secondary | ICD-10-CM | POA: Diagnosis not present

## 2024-03-07 DIAGNOSIS — M25561 Pain in right knee: Secondary | ICD-10-CM | POA: Diagnosis not present

## 2024-03-13 DIAGNOSIS — Z944 Liver transplant status: Secondary | ICD-10-CM | POA: Diagnosis not present

## 2024-03-13 DIAGNOSIS — D849 Immunodeficiency, unspecified: Secondary | ICD-10-CM | POA: Diagnosis not present

## 2024-03-20 DIAGNOSIS — B259 Cytomegaloviral disease, unspecified: Secondary | ICD-10-CM | POA: Diagnosis not present

## 2024-03-20 DIAGNOSIS — Z944 Liver transplant status: Secondary | ICD-10-CM | POA: Diagnosis not present

## 2024-03-20 DIAGNOSIS — D849 Immunodeficiency, unspecified: Secondary | ICD-10-CM | POA: Diagnosis not present

## 2024-03-20 DIAGNOSIS — R7401 Elevation of levels of liver transaminase levels: Secondary | ICD-10-CM | POA: Diagnosis not present

## 2024-03-22 DIAGNOSIS — R051 Acute cough: Secondary | ICD-10-CM | POA: Diagnosis not present

## 2024-03-22 DIAGNOSIS — R0981 Nasal congestion: Secondary | ICD-10-CM | POA: Diagnosis not present

## 2024-03-22 DIAGNOSIS — J0110 Acute frontal sinusitis, unspecified: Secondary | ICD-10-CM | POA: Diagnosis not present

## 2024-03-25 DIAGNOSIS — K838 Other specified diseases of biliary tract: Secondary | ICD-10-CM | POA: Diagnosis not present

## 2024-03-25 DIAGNOSIS — K760 Fatty (change of) liver, not elsewhere classified: Secondary | ICD-10-CM | POA: Diagnosis not present

## 2024-03-25 DIAGNOSIS — K7400 Hepatic fibrosis, unspecified: Secondary | ICD-10-CM | POA: Diagnosis not present

## 2024-03-25 DIAGNOSIS — R7401 Elevation of levels of liver transaminase levels: Secondary | ICD-10-CM | POA: Diagnosis not present

## 2024-03-25 DIAGNOSIS — X58XXXA Exposure to other specified factors, initial encounter: Secondary | ICD-10-CM | POA: Diagnosis not present

## 2024-03-25 DIAGNOSIS — T8649 Other complications of liver transplant: Secondary | ICD-10-CM | POA: Diagnosis not present

## 2024-03-25 DIAGNOSIS — Z944 Liver transplant status: Secondary | ICD-10-CM | POA: Diagnosis not present

## 2024-03-25 DIAGNOSIS — S3613XA Injury of bile duct, initial encounter: Secondary | ICD-10-CM | POA: Diagnosis not present

## 2024-03-25 DIAGNOSIS — K7689 Other specified diseases of liver: Secondary | ICD-10-CM | POA: Diagnosis not present

## 2024-03-25 NOTE — H&P (Signed)
 [facility] RADIOLOGY ASSESSMENT SUBJECTIVE:  Benjamin Knight is a 58 y.o. year old male requested to undergo ultrasound guided random liver biopsy under moderate sedation.  Patient prior anesthesia or moderate sedation: Yes, with no complications.  DNR Status: DNR: No    Suspended for procedure? Not applicable  ASA CLASS: ASA 3 - Patient with moderate systemic disease with functional limitations  ALLERGIES: Allergies  Allergen Reactions  . Barley Grass Unknown    Celiac  . Gluten Other (See Comments)    Celiac Disease  . Other Unknown    Wheat Barley [place], Celiac Disease  . Sertraline Other (See Comments) and Unknown    Tremors, felt weird  . Wheat Unknown    Celiac disease    MEDICATIONS: Current Outpatient Medications  Medication Sig Dispense Refill  . acetaminophen  (TYLENOL ) 325 MG tablet Take 2 tablets (650 mg total) by mouth every 8 (eight) hours as needed for Pain 100 tablet 0  . ascorbic acid, vitamin C, (VITAMIN C) 500 MG tablet Take 500 mg by mouth once daily    . calcium  citrate-vitamin D3 (CITRACAL+D) 315 mg-5 mcg (200 unit) tablet Take 2 tablets by mouth 2 (two) times daily at lunch and dinner 120 tablet 2  . chlorhexidine  (HIBICLENS ) 4 % external wash Apply topically once daily for 14 days 120 mL 0  . ELIQUIS 5 mg tablet Take 5 mg by mouth every 12 (twelve) hours    . ferrous sulfate  325 (65 FE) MG EC tablet Take 1 tablet (325 mg total) by mouth every other day 15 tablet 11  . magnesium oxide-magnesium amino acid chelate (MG-PLUS-PROTEIN) tablet Take 2 tablets (266 mg total) by mouth 3 (three) times daily before meals Take at 11am, 1pm, and 5pm. 120 tablet 11  . mycophenolate (CELLCEPT) 250 mg capsule Take 2 capsules (500 mg total) by mouth every 12 (twelve) hours 120 capsule 11  . tacrolimus  (PROGRAF ) 0.5 MG capsule Take 2 capsules (1 mg total) by mouth every morning AND 1 capsule (0.5 mg total) at bedtime. 90 capsule 11  . triamcinolone  0.1 % ointment  Apply topically 2 (two) times daily 453 g 0  . valGANciclovir (VALCYTE) 450 mg tablet Take 2 tablets (900 mg total) by mouth once daily 60 tablet 5   No current facility-administered medications for this encounter.     PROBLEM LIST: Problem List Items Addressed This Visit   None    PAST SURGICAL HISTORY: Past Surgical History:  Procedure Laterality Date  . ESOPHAGOGASTRODOUDENOSCOPY W/BIOPSY N/A 10/20/2021   Procedure: EGD - Upper Endoscopy;  Surgeon: Dinani, Amreen, MD;  Location: [facility];  Service: Gastroenterology;  Laterality: N/A;  . TRANSPLANT LIVER N/A 01/25/2023   Procedure: ADULT, LIVER ALLOTRANSPLANTATION; ORTHOTOPIC, PARTIAL OR WHOLE, FROM CADAVER OR LIVING DONOR;  Surgeon: Jaquelyn Rosette Fanti, MD;  Location: [facility];  Service: General Surgery;  Laterality: N/A;    PHYSICAL EXAM:  I have reviewed a recent chart H&P, examined the patient, and there is no interval change.  Vitals: There were no vitals taken for this visit. There is no height or weight on file to calculate BMI. General appearance: alert, cooperative, pleasant, in no acute distress Heart: regular rate and rhythm without murmur Lungs: clear to auscultation, with good air exchange, without rales or wheeze Abdomen: soft, nondistended, nontender, normal bowel sounds  MALLAMPATI SCORE: Class II: Soft palate, uvula, fauces visible.  LABS:  Recent Labs  Lab 03/20/24 0933  INR 1.2*   Recent Labs  Lab 03/20/24  0933  NA 137  K 4.5  CL 104  CO2 22  BUN 18  CREATININE 1.7*  GLUCOSE 166*  CALCIUM  9.1    ASSESSMENT/PLAN:  Pulmonary, cardiac, abdominal, airway, and mental status appropriate for this procedure.  Moderate sedation is appropriate for this patient at this time.

## 2024-03-27 ENCOUNTER — Encounter: Payer: Self-pay | Admitting: Occupational Therapy

## 2024-03-27 ENCOUNTER — Ambulatory Visit: Attending: Vascular Surgery | Admitting: Occupational Therapy

## 2024-03-27 DIAGNOSIS — M25561 Pain in right knee: Secondary | ICD-10-CM | POA: Diagnosis not present

## 2024-03-27 DIAGNOSIS — I890 Lymphedema, not elsewhere classified: Secondary | ICD-10-CM | POA: Insufficient documentation

## 2024-03-27 DIAGNOSIS — D849 Immunodeficiency, unspecified: Secondary | ICD-10-CM | POA: Diagnosis not present

## 2024-03-27 DIAGNOSIS — Z944 Liver transplant status: Secondary | ICD-10-CM | POA: Diagnosis not present

## 2024-03-27 DIAGNOSIS — B259 Cytomegaloviral disease, unspecified: Secondary | ICD-10-CM | POA: Diagnosis not present

## 2024-03-27 NOTE — Therapy (Addendum)
 OUTPATIENT OCCUPATIONAL THERAPY EVALUATION  BILATERAL LOWER EXTREMITY LYMPHEDEMA Patient Name: Benjamin Knight MRN: [id number] DOB:09-14-1965, 58 y.o., male Today's Date: 03/27/2024  END OF SESSION:   OT End of Session - 03/27/24 0928     Visit Number 1    Number of Visits 36    Date for Recertification  06/25/24    OT Start Time 0910    OT Stop Time 1004    OT Time Calculation (min) 54 min    Activity Tolerance Patient tolerated treatment well;No increased pain    Behavior During Therapy WFL for tasks assessed/performed            Past Medical History:  Diagnosis Date   Alcoholic cirrhosis (HCC)    Bipolar disorder (HCC)    Chronic back pain    Cirrhosis (HCC)    Depression    Esophageal varices (HCC)    Portal hypertensive gastropathy (HCC)    Past Surgical History:  Procedure Laterality Date   APPENDECTOMY     Patient Active Problem List   Diagnosis Date Noted   Acute metabolic encephalopathy 12/26/2022   Upper GI bleed 12/26/2022   Portal hypertensive gastropathy (HCC) 12/26/2022   History of celiac disease 12/26/2022   History of bipolar disorder 12/26/2022   Chronic alcohol use 12/26/2022   AKI (acute kidney injury) 12/26/2022   Hypokalemia 12/26/2022   Acute encephalopathy 12/26/2022   Coffee ground emesis 12/26/2022   Heme positive stool 12/26/2022   Iron  deficiency anemia 05/10/2021   Seasonal allergic rhinitis 05/03/2021   Moderate recurrent major depression (HCC) 05/03/2021   Lactose intolerance 05/03/2021   History of alcohol abuse 05/03/2021   Gastritis 05/03/2021   Decompensated hepatic cirrhosis (HCC) 05/03/2021   Chronic diarrhea 05/03/2021   Celiac disease 05/03/2021   Blood in feces 05/03/2021   Ascites 05/03/2021   Anxiety 05/03/2021   Alcoholic cirrhosis (HCC) 05/03/2021   Monoclonal gammopathy 05/03/2021   Hepatic encephalopathy (HCC) 04/01/2021   AMS (altered mental status) 03/30/2021   Genetic testing 09/23/2020    Obstructive sleep apnea 12/03/2007    PCP: PCP recently left practice. New assignment pending  REFERRING PROVIDER: Zachary Gaskins, MD  REFERRING DIAG: I89.0  THERAPY DIAG:  Lymphedema, not elsewhere classified  Rationale for Evaluation and Treatment: Rehabilitation  ONSET DATE: Onset in 05/2022 with leg ulcer. R leg swells a little with standing 10 hour days. No known family hx of limb swelling  SUBJECTIVE:  SUBJECTIVE STATEMENT: Benjamin Knight is referred to Occupational Therapy by Vernell Farm, MD, for evaluation and treatment of BLE/BLQ lymphedema. Pt has not previously undergone lymphedema therapy before. He does not wear compression stockings because they hurt his legs and make swelling worse. Pt denies known family history of chronic, progressive limb swelling.   PERTINENT HISTORY:  Hx Superficial ulcer of lower extremity (CMS/HHS-HCC)  , OSA (not using cpap), venous incompetence, negative hx of DVT, positive hx of recurrent LLE cellulitis; BMI 31.16 kg/m2   PAIN:  Are you having pain? Yes: NPRS scale: 3/10 Pain location: below L knee Pain description: heaviness, discomfort Aggravating factors: standing, poor fitting compression stocking Relieving factors: elevation  PRECAUTIONS: Other: LYMPHEDEMA; immunosuppressed  FALLS:  Has patient fallen in last 6 months? No  LIVING ENVIRONMENT: Lives with: lives with their spouse Lives in: House/apartment Stairs: No;  Has following equipment at home: None  OCCUPATION: Machinist full time; work requires standing, lifting, pushing, pulling squatting, walking, dependent sitting  LEISURE: play 2 grandchildren  HAND DOMINANCE: right   PRIOR LEVEL OF FUNCTION: Independent  PATIENT GOALS: control leg swelling, reduce pain, keep swelling from  progressing   OBJECTIVE: Note: Objective measures were completed at Evaluation unless otherwise noted.  COGNITION:  Overall cognitive status: Within functional limits for tasks assessed   POSTURE: WNL  LE ROM: limited at L knee and ankle by swelling  LE MMT: [facility]  LYMPHEDEMA ASSESSMENTS:   SURGERY TYPE/DATE: L great saphenous vein ablation 6/25  NUMBER OF LYMPH NODES REMOVED: NA  INFECTIONS: 2025 s/p systemic infection  originating from chronic leg wound resulted in sepsis; hx recurrent cellulitis  HX VENOUS ULCER/s: positive   LYMPHEDEMA ASSESSMENTS:   BLE COMPARATIVE LIMB VOLUMETRICS TBA OT Rx 1  LANDMARK RIGHT    R LEG (A-D) N/A  R THIGH (E-G) ml  R FULL LIMB (A-G) ml  Limb Volume differential (LVD)  %  Volume change since initial %  Volume change overall V  (Blank rows = not tested)  LANDMARK LEFT   L LEG (A-D) N/A  L THIGH (E-G) ml  L FULL LIMB (A-G) ml  Limb Volume differential (LVD)  %  Volume change since initial %  Volume change overall %  (Blank rows = not tested)    SKIN CONDITION/ TISSUE INTEGRITY:  Skin  Description Hyper- Keratosis Peau d'  Orange Shiny Tight Fibrotic/ Indurated Fatty Doughy Spongy/ boggy   x   x L>R      Skin dry Flaky WNL Macerated   x      Color Redness Varicosities Blanching Hemosiderin Stain Mottled   x x  x     Odor Malodorous Yeast Fungal infection  WNL      x   Temperature Warm Cool wnl    x     Pitting Edema   1+ 2+ 3+ 4+ Non-pitting         x   Girth Symmetrical Asymmetrical                   Distribution    L>R toes to groin    Stemmer Sign Positive Negative   Slight + bilaterally    Lymphorrhea History Of:  Present Absent   x  x    Wounds History Of Present Absent Venous Arterial Pressure Sheer   x   x       Signs of Infection Erythema Acute Swelling Drainage Defined Borders  Sensation Light Touch Deep pressure Hypersensitivity   In tact Impaired In tact Impaired  Absent Impaired   x Tender x  x     Nails WNL   Fungus nail dystrophy   TBA     Hair Growth Symmetrical Asymmetrical   x    Skin Creases Base of toes  Ankles   Base of Fingers knees       Abdominal pannus Thigh Lobules  Face/neck   x x          GAIT: Distance walked: >500 ft Assistive device utilized: None Level of assistance: Complete Independence Comments: limp  LYMPHEDEMA LIFE IMPACT SCALE (LLIS):TBA OT RX visit 1  PATIENT EDUCATION:  Education details: Eval edu Discussed differential diagnoses for various swelling disorders. Provided basic level education regarding lymphatic structure and function, etiology, onset patterns, stages of progression, and prevention to limit infection risk, worsening condition and further functional decline. Pt edu for aught interaction between blood circulatory system and lymphatic circulation.Discussed  impact of gravity and co-morbidities on lymphatic function. Outlined Complete Decongestive Therapy (CDT)  as standard of care and provided in depth information regarding 4 primary components of Intensive and Self Management Phases, including Manual Lymph Drainage (MLD), compression wrapping and garments, skin care, and therapeutic exercise. Dempsey discussion with re need for frequent attendance and high burden of care when caregiver is needed, impact of co morbidities. We discussed  the chronic, progressive nature of lymphedema and Importance of daily, ongoing LE self-care essential for limiting progression and infection risk.  Person educated: Patient  Education method: Explanation, Demonstration, and Handouts Education comprehension: verbalized understanding, returned demonstration, verbal cues required, and needs further education   LYMPHEDEMA SELF-CARE HOME PROGRAM:  BLE lymphatic pumping there ex- 1 set of 10 each element, in order. Hold 5. 2 x daily 2. 23/7 hours daily, short stretch, knee length, multilayer compression bandages to one leg  at a time only during Intensive Phase CDT  3. During self-management phase of CDT Custom-made gradient compression garments and HOS devices are medically necessary because they are uniquely sized and shaped to fit the exact dimensions of the affected extremities, and to provide appropriate medical grade, graduated compression essential for optimally managing chronic, progressive lymphedema. Multiple custom compression garments are needed to ensure proper hygiene to limit infection risk. Custom compression garments should be replaced q 3-6 months When worn consistently for optimal lymphedema self-management over time. HOS devices, medically necessary to limit fibrosis buildup in tissue, should be replaced q 2 years and PRN when worn out.     4. Daily skin care with low ph lotion matching skin ph  5.  Daily simple self MLD. Consider fitting Pt with the advanced, Tactile Medical Flexitouch sequential, pneumatic , compression device for optimal lymphedema self care at home over time. [facility] research demonstrates that this device reduces infection risk of recurrent cellulitis.  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 Hs has a hx of slow healing wounds and recurrent cellulitis and lymphorrhea. Lymphedema limits his functional performance in all occupational domains, including basic and instrumental ADLs ( functional ambulation and mobility, LB dressing), productive activities (work, caring for others), leisure pursuits, social participation and quality of life. Benjamin Knight will benefit from skilled Occupational Therapy for Complete Decongestive Therapy, (CDT), the gold standard protocol for lymphedema care. CDT includes manual lymphatic drainage, therapeutic lymphatic pumping exercise, skin care and compression therapies. The goals of CDT  are to reduce limb swelling and associated pain,  to reduce infection risk and to limit  lymphedema progression. Emphasis of Occupational Therapy throughout treatment course is on Pt education and self-care training for long term self management at home with OT support PRN. Without  OT for CDT lymphedema physical and sensory symptoms will progress and further functional decline is expected.`   OBJECTIVE IMPAIRMENTS: Abnormal gait, decreased activity tolerance, decreased knowledge of condition, decreased knowledge of use of DME, decreased mobility, decreased ROM, increased edema, obesity, pain, and chromic limb swelling, increased infection risk.   ACTIVITY LIMITATIONS: Basic and instrumental ADLs  (including functional ambulation and mobility, lower body dressing and fitting preferred street shoes, driving, shopping), productive activities ( work duties, caring for others), leisure pursuits, social participation requiring prolonged standing, walking and/ or dependent sitting  PERSONAL FACTORS: Age, Behavior pattern, Fitness, Past/current experiences, occupational demands, Time since onset of injury/illness/exacerbation, and 1-2 co morbidities: CVI, recurrent cellulitis and hx of slow healing venous ulcers ,  are also affecting patient's functional outcome.   REHAB POTENTIAL: Good  EVALUATION COMPLEXITY: Moderate   GOALS: Goals reviewed with patient? Yes  SHORT TERM GOALS: Target date: 4th OT Rx visit  1. Pt will demonstrate understanding of lymphedema precautions and prevention strategies with modified independence using a printed reference to identify at least 5 precautions and discussing how s/he may implement them into daily life to reduce risk of progression with extra time. Baseline:Max A Goal status: INITIAL  2.  Pt will be able to apply knee length, multilayer, thigh length, gradient, compression wraps to one leg at a time with modified independence ( extra time) to decrease limb volume, to limit infection risk, and to limit lymphedema progression.  Baseline:  Dependent Goal status: INITIAL  LONG TERM GOALS: Target date: 06/24/24  1.Given this patient's Intake score of TBA % on the Lymphedema Life Impact Scale (LLIS), patient will experience a reduction of at least 10 points in his perceived level of functional impairment resulting from lymphedema to improve functional performance and quality of life (QOL). Baseline: TBA % Goal status: INITIAL  2.  Pt will achieve at least a 10% volume reduction in BLE to return limbs to typical size and shape, to limit infection risk and LE progression, to decrease pain, to improve function. Baseline: Dependent Goal status: INITIAL  3.  Pt will obtain appropriate compression garments/devices and achieve modified independence (extra time + assistive devices) with donning/doffing to optimize limb volume reductions and limit LE progression over time. Baseline: Dependent Goal status: INITIAL  4. During Intensive phase CDT, with modified independence, Pt will achieve at least 85% compliance with all adapted lymphedema self-care home program components, including daily skin care, compression wraps and /or garments, simple self MLD and lymphatic pumping therex to habituate LE self care protocol  into ADLs for optimal LE self-management over time. Baseline: Dependent Goal status: INITIAL  PLAN:  OT FREQUENCY: 2x/week  OT DURATION: 12 weeks  PLANNED INTERVENTIONS: Complete Decongestive Therapy C(CDT) to one limb at a time , including manual lymphatic drainage (MLD), skin care to limit infection risk, multilayer, gradient compression bandaging (one lower extremity at a time) fit with custom, flat knit compression garments once limbs are reduced to sustain clinical gains, lymphatic pumping exercise, elevation when seated. 97110-Therapeutic exercises,  97530- Therapeutic activity,  97535- Self Care,  97140- Manual therapy,  97016- Advanced, sequential pneumatic  compression device (Flexitouch),  Patient/Family  education,  Taping,  Manual lymph drainage,  Scar mobilization,DME instructions,  and  PLAN FOR NEXT SESSION:  Pt edu BLE comparative limb volumetrics   Zebedee Dec, MS, OTR/L, CLT-LANA 03/27/24 2:47 PM

## 2024-04-02 NOTE — Addendum Note (Signed)
 Addended by: MYRIAM ZEBEDEE CROME on: 04/02/2024 04:11 PM   Modules accepted: Orders

## 2024-04-06 DIAGNOSIS — Z944 Liver transplant status: Secondary | ICD-10-CM | POA: Diagnosis not present

## 2024-04-06 DIAGNOSIS — D849 Immunodeficiency, unspecified: Secondary | ICD-10-CM | POA: Diagnosis not present

## 2024-04-17 DIAGNOSIS — D849 Immunodeficiency, unspecified: Secondary | ICD-10-CM | POA: Diagnosis not present

## 2024-04-23 DIAGNOSIS — R739 Hyperglycemia, unspecified: Secondary | ICD-10-CM | POA: Diagnosis not present

## 2024-04-24 DIAGNOSIS — D849 Immunodeficiency, unspecified: Secondary | ICD-10-CM | POA: Diagnosis not present

## 2024-04-24 DIAGNOSIS — Z944 Liver transplant status: Secondary | ICD-10-CM | POA: Diagnosis not present

## 2024-04-24 DIAGNOSIS — B259 Cytomegaloviral disease, unspecified: Secondary | ICD-10-CM | POA: Diagnosis not present

## 2024-05-01 ENCOUNTER — Ambulatory Visit: Attending: Vascular Surgery | Admitting: Occupational Therapy

## 2024-05-01 DIAGNOSIS — D849 Immunodeficiency, unspecified: Secondary | ICD-10-CM | POA: Diagnosis not present

## 2024-05-01 DIAGNOSIS — Z944 Liver transplant status: Secondary | ICD-10-CM | POA: Diagnosis not present

## 2024-05-01 DIAGNOSIS — I890 Lymphedema, not elsewhere classified: Secondary | ICD-10-CM | POA: Diagnosis not present

## 2024-05-01 NOTE — Therapy (Signed)
 OUTPATIENT OCCUPATIONAL THERAPY EVALUATION  BILATERAL LOWER EXTREMITY LYMPHEDEMA Patient Name: URIYAH RASKA MRN: [id number] DOB:09-30-1965, 58 y.o., male Today's Date: 05/01/2024  END OF SESSION:   OT End of Session - 05/01/24 0956     Visit Number 2    Number of Visits 36    Date for Recertification  06/25/24    OT Start Time 1000    OT Stop Time 1105    OT Time Calculation (min) 65 min    Activity Tolerance Patient tolerated treatment well;No increased pain    Behavior During Therapy WFL for tasks assessed/performed            Past Medical History:  Diagnosis Date   Alcoholic cirrhosis (HCC)    Bipolar disorder (HCC)    Chronic back pain    Cirrhosis (HCC)    Depression    Esophageal varices (HCC)    Portal hypertensive gastropathy (HCC)    Past Surgical History:  Procedure Laterality Date   APPENDECTOMY     Patient Active Problem List   Diagnosis Date Noted   Acute metabolic encephalopathy 12/26/2022   Upper GI bleed 12/26/2022   Portal hypertensive gastropathy (HCC) 12/26/2022   History of celiac disease 12/26/2022   History of bipolar disorder 12/26/2022   Chronic alcohol use 12/26/2022   AKI (acute kidney injury) 12/26/2022   Hypokalemia 12/26/2022   Acute encephalopathy 12/26/2022   Coffee ground emesis 12/26/2022   Heme positive stool 12/26/2022   Iron  deficiency anemia 05/10/2021   Seasonal allergic rhinitis 05/03/2021   Moderate recurrent major depression (HCC) 05/03/2021   Lactose intolerance 05/03/2021   History of alcohol abuse 05/03/2021   Gastritis 05/03/2021   Decompensated hepatic cirrhosis (HCC) 05/03/2021   Chronic diarrhea 05/03/2021   Celiac disease 05/03/2021   Blood in feces 05/03/2021   Ascites 05/03/2021   Anxiety 05/03/2021   Alcoholic cirrhosis (HCC) 05/03/2021   Monoclonal gammopathy 05/03/2021   Hepatic encephalopathy (HCC) 04/01/2021   AMS (altered mental status) 03/30/2021   Genetic testing 09/23/2020    Obstructive sleep apnea 12/03/2007    PCP: PCP recently left practice. New assignment pending  REFERRING PROVIDER: Zachary Gaskins, MD  REFERRING DIAG: I89.0  THERAPY DIAG:  Lymphedema, not elsewhere classified  Rationale for Evaluation and Treatment: Rehabilitation  ONSET DATE: Onset in 05/2022 with leg ulcer. R leg swells a little with standing 10 hour days. No known family hx of limb swelling  SUBJECTIVE:  SUBJECTIVE STATEMENT: Mr Krammes presents to Occupational Therapy for CDT to BLE lymphedema. Pt was last seen for IE on 03/27/24. Pt reports he has been walking about 2 miles daily for exercise.Pt denies LE-related leg pain.  03/27/24 INITIAL OT EVAL: [name] is referred to Occupational Therapy by Vernell Farm, MD, for evaluation and treatment of BLE/BLQ lymphedema. Pt has not previously undergone lymphedema therapy before. He does not wear compression stockings because they hurt his legs and make swelling worse. Pt denies known family history of chronic, progressive limb swelling. )  PERTINENT HISTORY:  Hx Superficial ulcer of lower extremity (CMS/HHS-HCC)  , OSA (not using cpap), venous incompetence, negative hx of DVT, positive hx of recurrent LLE cellulitis; BMI 31.16 kg/m2   PAIN:  Are you having pain? Yes: NPRS scale: 3/10 Pain location: below L knee Pain description: heaviness, discomfort Aggravating factors: standing, poor fitting compression stocking Relieving factors: elevation  PRECAUTIONS: Other: LYMPHEDEMA; immunosuppressed  FALLS:  Has patient fallen in last 6 months? No  LIVING ENVIRONMENT: Lives with: lives with their spouse Lives in: House/apartment Stairs: No;  Has following equipment at home: None  OCCUPATION: Machinist full time; work requires standing,  lifting, pushing, pulling squatting, walking, dependent sitting  LEISURE: play 2 grandchildren  HAND DOMINANCE: right   PRIOR LEVEL OF FUNCTION: Independent  PATIENT GOALS: control leg swelling, reduce pain, keep swelling from progressing   OBJECTIVE: Note: Objective measures were completed at Evaluation unless otherwise noted.  COGNITION:  Overall cognitive status: Within functional limits for tasks assessed   POSTURE: WNL  LE ROM: limited at L knee and ankle by swelling  LE MMT: [facility]  LYMPHEDEMA ASSESSMENTS:   SURGERY TYPE/DATE: L great saphenous vein ablation 6/25  NUMBER OF LYMPH NODES REMOVED: NA  INFECTIONS: 2025 s/p systemic infection  originating from chronic leg wound resulted in sepsis; hx recurrent cellulitis  HX VENOUS ULCER/s: positive   LYMPHEDEMA ASSESSMENTS:   BLE COMPARATIVE LIMB VOLUMETRICS INITIAL 05/01/24  LANDMARK RIGHT  (dominant)  R LEG (A-D) 4417.2 ml  R THIGH (E-G) ml  R FULL LIMB (A-G) ml  Limb Volume differential (LVD)  %  Volume change since initial %  Volume change overall V  (Blank rows = not tested)  LANDMARK LEFT (RX)  L LEG (A-D) 4831.9 ml  L THIGH (E-G) ml  L FULL LIMB (A-G) ml  Limb Volume differential (LVD)  LVD measures 8.6%, L>R  Volume change since initial %  Volume change overall %  (Blank rows = not tested)    SKIN CONDITION/ TISSUE INTEGRITY:  Skin  Description Hyper- Keratosis Peau d'  Orange Shiny Tight Fibrotic/ Indurated Fatty Doughy Spongy/ boggy   x   x L>R      Skin dry Flaky WNL Macerated   x      Color Redness Varicosities Blanching Hemosiderin Stain Mottled   x x  x     Odor Malodorous Yeast Fungal infection  WNL      x   Temperature Warm Cool wnl    x     Pitting Edema   1+ 2+ 3+ 4+ Non-pitting         x   Girth Symmetrical Asymmetrical                   Distribution    L>R toes to groin    Stemmer Sign Positive Negative   Slight + bilaterally    Lymphorrhea History Of:   Present Absent   x  x    Wounds History Of Present Absent Venous Arterial Pressure Sheer   x   x       Signs of Infection Erythema Acute Swelling Drainage Defined Borders                Sensation Light Touch Deep pressure Hypersensitivity   In tact Impaired In tact Impaired Absent Impaired   x Tender x  x     Nails WNL   Fungus nail dystrophy   TBA     Hair Growth Symmetrical Asymmetrical   x    Skin Creases Base of toes  Ankles   Base of Fingers knees       Abdominal pannus Thigh Lobules  Face/neck   x x          GAIT: Distance walked: >500 ft Assistive device utilized: None Level of assistance: Complete Independence Comments: limp  LYMPHEDEMA LIFE IMPACT SCALE (LLIS): INITIAL 38.24%  PATIENT EDUCATION:  Education details: Eval edu Discussed differential diagnoses for various swelling disorders. Provided basic level education regarding lymphatic structure and function, etiology, onset patterns, stages of progression, and prevention to limit infection risk, worsening condition and further functional decline. Pt edu for aught interaction between blood circulatory system and lymphatic circulation.Discussed  impact of gravity and co-morbidities on lymphatic function. Outlined Complete Decongestive Therapy (CDT)  as standard of care and provided in depth information regarding 4 primary components of Intensive and Self Management Phases, including Manual Lymph Drainage (MLD), compression wrapping and garments, skin care, and therapeutic exercise. Dempsey discussion with re need for frequent attendance and high burden of care when caregiver is needed, impact of co morbidities. We discussed  the chronic, progressive nature of lymphedema and Importance of daily, ongoing LE self-care essential for limiting progression and infection risk.  Person educated: Patient  Education method: Explanation, Demonstration, and Handouts Education comprehension: verbalized understanding, returned  demonstration, verbal cues required, and needs further education   LYMPHEDEMA SELF-CARE HOME PROGRAM:  BLE lymphatic pumping there ex- 1 set of 10 each element, in order. Hold 5. 2 x daily 2. 23/7 hours daily, short stretch, knee length, multilayer compression bandages to one leg at a time only during Intensive Phase CDT  3. During self-management phase of CDT Custom-made gradient compression garments and HOS devices are medically necessary because they are uniquely sized and shaped to fit the exact dimensions of the affected extremities, and to provide appropriate medical grade, graduated compression essential for optimally managing chronic, progressive lymphedema. Multiple custom compression garments are needed to ensure proper hygiene to limit infection risk. Custom compression garments should be replaced q 3-6 months When worn consistently for optimal lymphedema self-management over time. HOS devices, medically necessary to limit fibrosis buildup in tissue, should be replaced q 2 years and PRN when worn out.     4. Daily skin care with low ph lotion matching skin ph  5.  Daily simple self MLD. Consider fitting Pt with the advanced, Tactile Medical Flexitouch sequential, pneumatic , compression device for optimal lymphedema self care at home over time. [facility] research demonstrates that this device reduces infection risk of recurrent cellulitis.  ASSESSMENT:  CLINICAL IMPRESSION: Initial BLE limb volumetrics taken today reveals limb volume differential (LVD) measures 8.6%, L (Rx) >R (dominant). Applied multilayer , short stretch compression wrap , and teaching Pt the methodology throughout demonstration. Applied single layer of .04 cm thick Rosidal foam of cotton stockinett, then one each 8.10, and 12 cm wide x 5 meters long short  stretch wraps over foam  using gradient techniques, from base of toes to popliteal fossa. Pt instructed to try to tolerate for 24 hours, but remove wraps if they cause any  pain or discomfort. Cont as per POC.   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 Hs has a hx of slow healing wounds and recurrent cellulitis and lymphorrhea. Lymphedema limits his functional performance in all occupational domains, including basic and instrumental ADLs ( functional ambulation and mobility, LB dressing), productive activities (work, caring for others), leisure pursuits, social participation and quality of life. Mr. Marcy will benefit from skilled Occupational Therapy for Complete Decongestive Therapy, (CDT), the gold standard protocol for lymphedema care. CDT includes manual lymphatic drainage, therapeutic lymphatic pumping exercise, skin care and compression therapies. The goals of CDT are to reduce limb swelling and associated pain,  to reduce infection risk and to limit lymphedema progression. Emphasis of Occupational Therapy throughout treatment course is on Pt education and self-care training for long term self management at home with OT support PRN. Without  OT for CDT lymphedema physical and sensory symptoms will progress and further functional decline is expected.)   OBJECTIVE IMPAIRMENTS: Abnormal gait, decreased activity tolerance, decreased knowledge of condition, decreased knowledge of use of DME, decreased mobility, decreased ROM, increased edema, obesity, pain, and chromic limb swelling, increased infection risk.   ACTIVITY LIMITATIONS: Basic and instrumental ADLs  (including functional ambulation and mobility, lower body dressing and fitting preferred street shoes, driving, shopping), productive activities ( work duties, caring for others), leisure pursuits, social participation requiring prolonged standing, walking and/ or dependent sitting  PERSONAL FACTORS: Age, Behavior pattern, Fitness, Past/current experiences, occupational demands, Time since onset of injury/illness/exacerbation, and 1-2 co  morbidities: CVI, recurrent cellulitis and hx of slow healing venous ulcers ,  are also affecting patient's functional outcome.   REHAB POTENTIAL: Good  EVALUATION COMPLEXITY: Moderate   GOALS: Goals reviewed with patient? Yes  SHORT TERM GOALS: Target date: 4th OT Rx visit  1. Pt will demonstrate understanding of lymphedema precautions and prevention strategies with modified independence using a printed reference to identify at least 5 precautions and discussing how s/he may implement them into daily life to reduce risk of progression with extra time. Baseline:Max A Goal status: INITIAL  2.  Pt will be able to apply knee length, multilayer, thigh length, gradient, compression wraps to one leg at a time with modified independence ( extra time) to decrease limb volume, to limit infection risk, and to limit lymphedema progression.  Baseline: Dependent Goal status: INITIAL  LONG TERM GOALS: Target date: 06/24/24  1.Given this patient's Intake score of TBA % on the Lymphedema Life Impact Scale (LLIS), patient will experience a reduction of at least 10 points in his perceived level of functional impairment resulting from lymphedema to improve functional performance and quality of life (QOL). Baseline: TBA % Goal status: INITIAL  2.  Pt will achieve at least a 10% volume reduction in BLE to return limbs to typical size and shape, to limit infection risk and LE progression, to decrease pain, to improve function. Baseline: Dependent Goal status: INITIAL  3.  Pt will obtain appropriate compression garments/devices and achieve modified independence (extra time + assistive devices) with donning/doffing to optimize limb volume reductions and limit LE progression over time. Baseline: Dependent Goal status: INITIAL  4. During Intensive phase CDT, with modified independence, Pt will achieve at least 85% compliance with  all adapted lymphedema self-care home program components, including daily skin  care, compression wraps and /or garments, simple self MLD and lymphatic pumping therex to habituate LE self care protocol  into ADLs for optimal LE self-management over time. Baseline: Dependent Goal status: INITIAL  PLAN:  OT FREQUENCY: 2x/week  OT DURATION: 12 weeks  PLANNED INTERVENTIONS: Complete Decongestive Therapy C(CDT) to one limb at a time , including manual lymphatic drainage (MLD), skin care to limit infection risk, multilayer, gradient compression bandaging (one lower extremity at a time) fit with custom, flat knit compression garments once limbs are reduced to sustain clinical gains, lymphatic pumping exercise, elevation when seated. [id number]-Therapeutic exercises,  97530- Therapeutic activity,  97535- Self Care,  97140- Manual therapy,  97016- Advanced, sequential pneumatic  compression device (Flexitouch),  Patient/Family education,  Taping,  Manual lymph drainage,  Scar mobilization,DME instructions, and  PLAN FOR NEXT SESSION:  Pt edu BLE comparative limb volumetrics   Zebedee Dec, MS, OTR/L, CLT-LANA 05/01/24 11:06 AM

## 2024-05-08 DIAGNOSIS — B259 Cytomegaloviral disease, unspecified: Secondary | ICD-10-CM | POA: Diagnosis not present

## 2024-05-08 DIAGNOSIS — Z944 Liver transplant status: Secondary | ICD-10-CM | POA: Diagnosis not present

## 2024-05-15 ENCOUNTER — Encounter: Payer: Self-pay | Admitting: Occupational Therapy

## 2024-05-15 ENCOUNTER — Ambulatory Visit: Attending: Vascular Surgery | Admitting: Occupational Therapy

## 2024-05-15 DIAGNOSIS — I890 Lymphedema, not elsewhere classified: Secondary | ICD-10-CM | POA: Diagnosis not present

## 2024-05-15 NOTE — Therapy (Signed)
 OUTPATIENT OCCUPATIONAL THERAPY EVALUATION  BILATERAL LOWER EXTREMITY LYMPHEDEMA Patient Name: Benjamin Knight MRN: [id number] DOB:[birth year], 58 y.o., male Today's Date: 05/15/2024  END OF SESSION:   OT End of Session - 05/15/24 0906     Visit Number 3    Number of Visits 36    Date for Recertification  06/25/24    OT Start Time 0903    OT Stop Time 1003    OT Time Calculation (min) 60 min    Activity Tolerance Patient tolerated treatment well;No increased pain    Behavior During Therapy WFL for tasks assessed/performed            Past Medical History:  Diagnosis Date   Alcoholic cirrhosis (HCC)    Bipolar disorder (HCC)    Chronic back pain    Cirrhosis (HCC)    Depression    Esophageal varices (HCC)    Portal hypertensive gastropathy (HCC)    Past Surgical History:  Procedure Laterality Date   APPENDECTOMY     Patient Active Problem List   Diagnosis Date Noted   Acute metabolic encephalopathy 12/26/2022   Upper GI bleed 12/26/2022   Portal hypertensive gastropathy (HCC) 12/26/2022   History of celiac disease 12/26/2022   History of bipolar disorder 12/26/2022   Chronic alcohol use 12/26/2022   AKI (acute kidney injury) 12/26/2022   Hypokalemia 12/26/2022   Acute encephalopathy 12/26/2022   Coffee ground emesis 12/26/2022   Heme positive stool 12/26/2022   Iron  deficiency anemia 05/10/2021   Seasonal allergic rhinitis 05/03/2021   Moderate recurrent major depression (HCC) 05/03/2021   Lactose intolerance 05/03/2021   History of alcohol abuse 05/03/2021   Gastritis 05/03/2021   Decompensated hepatic cirrhosis (HCC) 05/03/2021   Chronic diarrhea 05/03/2021   Celiac disease 05/03/2021   Blood in feces 05/03/2021   Ascites 05/03/2021   Anxiety 05/03/2021   Alcoholic cirrhosis (HCC) 05/03/2021   Monoclonal gammopathy 05/03/2021   Hepatic encephalopathy (HCC) 04/01/2021   AMS (altered mental status) 03/30/2021   Genetic testing 09/23/2020    Obstructive sleep apnea 12/03/2007    PCP: PCP recently left practice. New assignment pending  REFERRING PROVIDER: Zachary Gaskins, MD  REFERRING DIAG: I89.0  THERAPY DIAG:  Lymphedema, not elsewhere classified  Rationale for Evaluation and Treatment: Rehabilitation  ONSET DATE: Onset in 05/2022 with leg ulcer. R leg swells a little with standing 10 hour days. No known family hx of limb swelling  SUBJECTIVE:  SUBJECTIVE STATEMENT: Benjamin Knight presents to Occupational Therapy for CDT to BLE lymphedema. Benjamin Knight was last seen for IE on 05/01/24. Benjamin Knight reports he tore his R meniscus during the visit interval and is unable to take effective pain medication because of his transplant medication. Benjamin Knight rates R knee pain as 7/10 on average when weight bearing. He reports he wrapped the L leg a couple of times during the interval and swelling is reduced. He reports L pain and sensation of heaviness is resolved at present.  03/27/24 INITIAL OT EVAL: Benjamin Knight is referred to Occupational Therapy by Vernell Farm, MD, for evaluation and treatment of BLE/BLQ lymphedema. Benjamin Knight has not previously undergone lymphedema therapy before. He does not wear compression stockings because they hurt his legs and make swelling worse. Benjamin Knight denies known family history of chronic, progressive limb swelling. )  PERTINENT HISTORY:  Hx Superficial ulcer of lower extremity (CMS/HHS-HCC)  , OSA (not using CPAP), venous incompetence, negative hx of DVT, positive hx of recurrent LLE cellulitis; BMI 31.16 kg/m2   PAIN:  Are you having pain? Yes: NPRS scale: 7/10 Pain location: R knee Pain description: sharp, stabbing pain, discomfort Aggravating factors: standing, walking, weight bearing Relieving factors: getting off my feet, ice  PRECAUTIONS: Other:  LYMPHEDEMA; immunosuppressed  FALLS:  Has patient fallen in last 6 months? No  LIVING ENVIRONMENT: Lives with: lives with their spouse Lives in: House/apartment Stairs: No;  Has following equipment at home: None  OCCUPATION: Machinist full time; work requires standing, lifting, pushing, pulling squatting, walking, dependent sitting  LEISURE: play 2 grandchildren  HAND DOMINANCE: right   PRIOR LEVEL OF FUNCTION: Independent  PATIENT GOALS: control leg swelling, reduce pain, keep swelling from progressing   OBJECTIVE: Note: Objective measures were completed at Evaluation unless otherwise noted.  COGNITION:  Overall cognitive status: Within functional limits for tasks assessed   POSTURE: WNL  LE ROM: limited at L knee and ankle by swelling  LE MMT: [facility]  LYMPHEDEMA ASSESSMENTS:   SURGERY TYPE/DATE: L great saphenous vein ablation 6/25  NUMBER OF LYMPH NODES REMOVED: NA  INFECTIONS: 2025 s/p systemic infection  originating from chronic leg wound resulted in sepsis; hx recurrent cellulitis  HX VENOUS ULCER/s: positive   LYMPHEDEMA ASSESSMENTS:   BLE COMPARATIVE LIMB VOLUMETRICS INITIAL 05/01/24  LANDMARK RIGHT  (dominant)  R LEG (A-D) 4417.2 ml  R THIGH (E-G) ml  R FULL LIMB (A-G) ml  Limb Volume differential (LVD)  %  Volume change since initial %  Volume change overall V  (Blank rows = not tested)  LANDMARK LEFT (RX)  L LEG (A-D) 4831.9 ml  L THIGH (E-G) ml  L FULL LIMB (A-G) ml  Limb Volume differential (LVD)  LVD measures 8.6%, L>R  Volume change since initial %  Volume change overall %  (Blank rows = not tested)    SKIN CONDITION/ TISSUE INTEGRITY:  Skin  Description Hyper- Keratosis Peau d'  Orange Shiny Tight Fibrotic/ Indurated Fatty Doughy Spongy/ boggy   x   x L>R      Skin dry Flaky WNL Macerated   x      Color Redness Varicosities Blanching Hemosiderin Stain Mottled   x x  x     Odor Malodorous Yeast Fungal infection  WNL       x   Temperature Warm Cool wnl    x     Pitting Edema   1+ 2+ 3+ 4+ Non-pitting         x  Girth Symmetrical Asymmetrical                   Distribution    L>R toes to groin    Stemmer Sign Positive Negative   Slight + bilaterally    Lymphorrhea History Of:  Present Absent   x  x    Wounds History Of Present Absent Venous Arterial Pressure Sheer   x   x       Signs of Infection Erythema Acute Swelling Drainage Defined Borders                Sensation Light Touch Deep pressure Hypersensitivity   In tact Impaired In tact Impaired Absent Impaired   x Tender x  x     Nails WNL   Fungus nail dystrophy   TBA     Hair Growth Symmetrical Asymmetrical   x    Skin Creases Base of toes  Ankles   Base of Fingers knees       Abdominal pannus Thigh Lobules  Face/neck   x x          GAIT: Distance walked: >500 ft Assistive device utilized: None Level of assistance: Complete Independence Comments: limp  LYMPHEDEMA LIFE IMPACT SCALE (LLIS): INITIAL 38.24%  PATIENT EDUCATION:  Education details: Eval edu Discussed differential diagnoses for various swelling disorders. Provided basic level education regarding lymphatic structure and function, etiology, onset patterns, stages of progression, and prevention to limit infection risk, worsening condition and further functional decline. Benjamin Knight edu for aught interaction between blood circulatory system and lymphatic circulation.Discussed  impact of gravity and co-morbidities on lymphatic function. Outlined Complete Decongestive Therapy (CDT)  as standard of care and provided in depth information regarding 4 primary components of Intensive and Self Management Phases, including Manual Lymph Drainage (MLD), compression wrapping and garments, skin care, and therapeutic exercise. Dempsey discussion with re need for frequent attendance and high burden of care when caregiver is needed, impact of co morbidities. We discussed  the chronic,  progressive nature of lymphedema and Importance of daily, ongoing LE self-care essential for limiting progression and infection risk.  Person educated: Patient  Education method: Explanation, Demonstration, and Handouts Education comprehension: verbalized understanding, returned demonstration, verbal cues required, and needs further education   LYMPHEDEMA SELF-CARE HOME PROGRAM:  BLE lymphatic pumping there ex- 1 set of 10 each element, in order. Hold 5. 2 x daily 2. 23/7 hours daily, short stretch, knee length, multilayer compression bandages to one leg at a time only during Intensive Phase CDT  3. During self-management phase of CDT Custom-made gradient compression garments and HOS devices are medically necessary because they are uniquely sized and shaped to fit the exact dimensions of the affected extremities, and to provide appropriate medical grade, graduated compression essential for optimally managing chronic, progressive lymphedema. Multiple custom compression garments are needed to ensure proper hygiene to limit infection risk. Custom compression garments should be replaced q 3-6 months When worn consistently for optimal lymphedema self-management over time. HOS devices, medically necessary to limit fibrosis buildup in tissue, should be replaced q 2 years and PRN when worn out.     4. Daily skin care with low ph lotion matching skin ph  5.  Daily simple self MLD. Consider fitting Benjamin Knight with the advanced, Tactile Medical Flexitouch sequential, pneumatic , compression device for optimal lymphedema self care at home over time. [facility] research demonstrates that this device reduces infection risk of recurrent cellulitis.  ASSESSMENT:  CLINICAL IMPRESSION: Benjamin Knight last seen  2 weeks ago for initial; volumetrics and compression wrapping. Benjamin Knight reports he is unable to attend OT more than 1 x weekly due to having many medical appointments and full-time employment. Benjamin Knight wrapped his L leg 2 x after last session and  reports it reduced in volume,  quite a bit.  He has not wrapped since because he has been on vacation. Benjamin Knight reporting 0/10 L leg pain associated w LE today, but is 7/10 pain due to torn R meniscus.   Commenced RLE MLD today using functional inguinal watershed. Benjamin Knight able to perform diaphragmatic breathing after skilled teaching for lymphatic anatomy and treatment sequences. Benjamin Knight tolerated manual therapy without increased pain and reported decreased R knee pain after session. Applied single wide ACE wrap to R knee for support. No compression wraps on the L today to limit falls risk. Cont as per POC.   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 Hs has a hx of slow healing wounds and recurrent cellulitis and lymphorrhea. Lymphedema limits his functional performance in all occupational domains, including basic and instrumental ADLs ( functional ambulation and mobility, LB dressing), productive activities (work, caring for others), leisure pursuits, social participation and quality of life. Benjamin Knight will benefit from skilled Occupational Therapy for Complete Decongestive Therapy, (CDT), the gold standard protocol for lymphedema care. CDT includes manual lymphatic drainage, therapeutic lymphatic pumping exercise, skin care and compression therapies. The goals of CDT are to reduce limb swelling and associated pain,  to reduce infection risk and to limit lymphedema progression. Emphasis of Occupational Therapy throughout treatment course is on Benjamin Knight education and self-care training for long term self management at home with OT support PRN. Without  OT for CDT lymphedema physical and sensory symptoms will progress and further functional decline is expected.)   OBJECTIVE IMPAIRMENTS: Abnormal gait, decreased activity tolerance, decreased knowledge of condition, decreased knowledge of use of DME, decreased mobility, decreased ROM, increased edema,  obesity, pain, and chromic limb swelling, increased infection risk.   ACTIVITY LIMITATIONS: Basic and instrumental ADLs  (including functional ambulation and mobility, lower body dressing and fitting preferred street shoes, driving, shopping), productive activities ( work duties, caring for others), leisure pursuits, social participation requiring prolonged standing, walking and/ or dependent sitting  PERSONAL FACTORS: Age, Behavior pattern, Fitness, Past/current experiences, occupational demands, Time since onset of injury/illness/exacerbation, and 1-2 co morbidities: CVI, recurrent cellulitis and hx of slow healing venous ulcers ,  are also affecting patient's functional outcome.   REHAB POTENTIAL: Good  EVALUATION COMPLEXITY: Moderate   GOALS: Goals reviewed with patient? Yes  SHORT TERM GOALS: Target date: 4th OT Rx visit  1. Benjamin Knight will demonstrate understanding of lymphedema precautions and prevention strategies with modified independence using a printed reference to identify at least 5 precautions and discussing how s/he may implement them into daily life to reduce risk of progression with extra time. Baseline:Max A Goal status: INITIAL  2.  Benjamin Knight will be able to apply knee length, multilayer, thigh length, gradient, compression wraps to one leg at a time with modified independence ( extra time) to decrease limb volume, to limit infection risk, and to limit lymphedema progression.  Baseline: Dependent Goal status: INITIAL  LONG TERM GOALS: Target date: 06/24/24  1.Given this patient's Intake score of TBA % on the Lymphedema Life Impact Scale (LLIS), patient will experience a reduction of at least 10 points in his perceived level of functional impairment resulting from  lymphedema to improve functional performance and quality of life (QOL). Baseline: TBA % Goal status: INITIAL  2.  Benjamin Knight will achieve at least a 10% volume reduction in BLE to return limbs to typical size and shape, to limit  infection risk and LE progression, to decrease pain, to improve function. Baseline: Dependent Goal status: INITIAL  3.  Benjamin Knight will obtain appropriate compression garments/devices and achieve modified independence (extra time + assistive devices) with donning/doffing to optimize limb volume reductions and limit LE progression over time. Baseline: Dependent Goal status: INITIAL  4. During Intensive phase CDT, with modified independence, Benjamin Knight will achieve at least 85% compliance with all adapted lymphedema self-care home program components, including daily skin care, compression wraps and /or garments, simple self MLD and lymphatic pumping therex to habituate LE self care protocol  into ADLs for optimal LE self-management over time. Baseline: Dependent Goal status: INITIAL  PLAN:  OT FREQUENCY: 2x/week  OT DURATION: 12 weeks  PLANNED INTERVENTIONS: Complete Decongestive Therapy C(CDT) to one limb at a time , including manual lymphatic drainage (MLD), skin care to limit infection risk, multilayer, gradient compression bandaging (one lower extremity at a time) fit with custom, flat knit compression garments once limbs are reduced to sustain clinical gains, lymphatic pumping exercise, elevation when seated. 97110-Therapeutic exercises,  97530- Therapeutic activity,  97535- Self Care,  97140- Manual therapy,  97016- Advanced, sequential pneumatic  compression device (Flexitouch),  Patient/Family education,  Taping,  Manual lymph drainage,  Scar mobilization,DME instructions, and  PLAN FOR NEXT SESSION:  Benjamin Knight edu BLE comparative limb volumetrics   Zebedee Dec, MS, OTR/L, CLT-LANA 05/15/24 10:11 AM

## 2024-05-22 DIAGNOSIS — D849 Immunodeficiency, unspecified: Secondary | ICD-10-CM | POA: Diagnosis not present

## 2024-06-04 DIAGNOSIS — D849 Immunodeficiency, unspecified: Secondary | ICD-10-CM | POA: Diagnosis not present

## 2024-06-04 DIAGNOSIS — Z944 Liver transplant status: Secondary | ICD-10-CM | POA: Diagnosis not present

## 2024-06-05 ENCOUNTER — Ambulatory Visit: Admitting: Occupational Therapy

## 2024-06-05 DIAGNOSIS — A084 Viral intestinal infection, unspecified: Secondary | ICD-10-CM | POA: Diagnosis not present

## 2024-06-05 DIAGNOSIS — Z888 Allergy status to other drugs, medicaments and biological substances status: Secondary | ICD-10-CM | POA: Diagnosis not present

## 2024-06-05 DIAGNOSIS — T451X5A Adverse effect of antineoplastic and immunosuppressive drugs, initial encounter: Secondary | ICD-10-CM | POA: Diagnosis not present

## 2024-06-05 DIAGNOSIS — Z944 Liver transplant status: Secondary | ICD-10-CM | POA: Diagnosis not present

## 2024-06-05 DIAGNOSIS — B259 Cytomegaloviral disease, unspecified: Secondary | ICD-10-CM | POA: Diagnosis not present

## 2024-06-05 DIAGNOSIS — Z7901 Long term (current) use of anticoagulants: Secondary | ICD-10-CM | POA: Diagnosis not present

## 2024-06-05 DIAGNOSIS — Z720 Tobacco use: Secondary | ICD-10-CM | POA: Diagnosis not present

## 2024-06-05 DIAGNOSIS — D849 Immunodeficiency, unspecified: Secondary | ICD-10-CM | POA: Diagnosis not present

## 2024-06-05 DIAGNOSIS — K7030 Alcoholic cirrhosis of liver without ascites: Secondary | ICD-10-CM | POA: Diagnosis not present

## 2024-06-05 DIAGNOSIS — Z794 Long term (current) use of insulin: Secondary | ICD-10-CM | POA: Diagnosis not present

## 2024-06-05 DIAGNOSIS — Z79899 Other long term (current) drug therapy: Secondary | ICD-10-CM | POA: Diagnosis not present

## 2024-06-05 DIAGNOSIS — Z6832 Body mass index (BMI) 32.0-32.9, adult: Secondary | ICD-10-CM | POA: Diagnosis not present

## 2024-06-05 DIAGNOSIS — R739 Hyperglycemia, unspecified: Secondary | ICD-10-CM | POA: Diagnosis not present

## 2024-06-05 DIAGNOSIS — Z91018 Allergy to other foods: Secondary | ICD-10-CM | POA: Diagnosis not present

## 2024-06-05 DIAGNOSIS — D84821 Immunodeficiency due to drugs: Secondary | ICD-10-CM | POA: Diagnosis not present

## 2024-06-05 DIAGNOSIS — N179 Acute kidney failure, unspecified: Secondary | ICD-10-CM | POA: Diagnosis not present

## 2024-06-05 DIAGNOSIS — Z7960 Long term (current) use of unspecified immunomodulators and immunosuppressants: Secondary | ICD-10-CM | POA: Diagnosis not present

## 2024-06-05 DIAGNOSIS — E878 Other disorders of electrolyte and fluid balance, not elsewhere classified: Secondary | ICD-10-CM | POA: Diagnosis not present

## 2024-06-05 DIAGNOSIS — E669 Obesity, unspecified: Secondary | ICD-10-CM | POA: Diagnosis not present

## 2024-06-05 DIAGNOSIS — E861 Hypovolemia: Secondary | ICD-10-CM | POA: Diagnosis not present

## 2024-06-05 DIAGNOSIS — T380X5A Adverse effect of glucocorticoids and synthetic analogues, initial encounter: Secondary | ICD-10-CM | POA: Diagnosis not present

## 2024-06-11 DIAGNOSIS — Z944 Liver transplant status: Secondary | ICD-10-CM | POA: Diagnosis not present

## 2024-06-12 ENCOUNTER — Ambulatory Visit: Attending: Vascular Surgery | Admitting: Occupational Therapy

## 2024-06-12 DIAGNOSIS — I890 Lymphedema, not elsewhere classified: Secondary | ICD-10-CM | POA: Diagnosis not present

## 2024-06-12 DIAGNOSIS — E878 Other disorders of electrolyte and fluid balance, not elsewhere classified: Secondary | ICD-10-CM | POA: Diagnosis not present

## 2024-06-12 DIAGNOSIS — R739 Hyperglycemia, unspecified: Secondary | ICD-10-CM | POA: Diagnosis not present

## 2024-06-12 NOTE — Therapy (Signed)
 OUTPATIENT OCCUPATIONAL THERAPY EVALUATION  BILATERAL LOWER EXTREMITY LYMPHEDEMA Patient Name: Benjamin Knight MRN: [id number] DOB:[birth year], 58 y.o., male Today's Date: 06/12/2024  END OF SESSION:   OT End of Session - 06/12/24 1120     Visit Number 4    Number of Visits 36    Date for Recertification  06/25/24    OT Start Time 0900    OT Stop Time 1000    OT Time Calculation (min) 60 min    Activity Tolerance Patient tolerated treatment well;No increased pain    Behavior During Therapy WFL for tasks assessed/performed            Past Medical History:  Diagnosis Date   Alcoholic cirrhosis (HCC)    Bipolar disorder (HCC)    Chronic back pain    Cirrhosis (HCC)    Depression    Esophageal varices (HCC)    Portal hypertensive gastropathy (HCC)    Past Surgical History:  Procedure Laterality Date   APPENDECTOMY     Patient Active Problem List   Diagnosis Date Noted   Acute metabolic encephalopathy 12/26/2022   Upper GI bleed 12/26/2022   Portal hypertensive gastropathy (HCC) 12/26/2022   History of celiac disease 12/26/2022   History of bipolar disorder 12/26/2022   Chronic alcohol use 12/26/2022   AKI (acute kidney injury) 12/26/2022   Hypokalemia 12/26/2022   Acute encephalopathy 12/26/2022   Coffee ground emesis 12/26/2022   Heme positive stool 12/26/2022   Iron  deficiency anemia 05/10/2021   Seasonal allergic rhinitis 05/03/2021   Moderate recurrent major depression (HCC) 05/03/2021   Lactose intolerance 05/03/2021   History of alcohol abuse 05/03/2021   Gastritis 05/03/2021   Decompensated hepatic cirrhosis (HCC) 05/03/2021   Chronic diarrhea 05/03/2021   Celiac disease 05/03/2021   Blood in feces 05/03/2021   Ascites 05/03/2021   Anxiety 05/03/2021   Alcoholic cirrhosis (HCC) 05/03/2021   Monoclonal gammopathy 05/03/2021   Hepatic encephalopathy (HCC) 04/01/2021   AMS (altered mental status) 03/30/2021   Genetic testing 09/23/2020    Obstructive sleep apnea 12/03/2007    PCP: PCP recently left practice. New assignment pending  REFERRING PROVIDER: Zachary Gaskins, MD  REFERRING DIAG: I89.0  THERAPY DIAG:  Lymphedema, not elsewhere classified  Rationale for Evaluation and Treatment: Rehabilitation  ONSET DATE: Onset in 05/2022 with leg ulcer. R leg swells a little with standing 10 hour days. No known family hx of limb swelling  SUBJECTIVE:  SUBJECTIVE STATEMENT: [name] presents to Occupational Therapy for CDT to BLE lymphedema. Pt was last seen for lymphedema treatment on 05/15/24.  Instead of weekly his OT visits for lymphedema care have been more like 1 month, to 2 weeks apart. He was hospitalized on 12/5 for acute kidney injury after becoming hypovolemic after an episode of stomach bug . Pt reports his R meniscus seems to be doing much better. He feels leg swelling is much improved. He did not being wraps to session. He reports he wrapped the L leg a couple of times during the interval and swelling is reduced. He reports L pain and sensation of heaviness is resolved at present. Pt denies LE related leg pain as 7/10 on average when weight bearing. He reports he wrapped the L leg a couple of times during the interval and swelling is reduced. He reports L pain and sensation of heaviness is resolved at present.  03/27/24 INITIAL OT EVAL: [name] is referred to Occupational Therapy by Vernell Farm, MD, for evaluation and treatment of BLE/BLQ lymphedema. Pt has not previously undergone lymphedema therapy before. He does not wear compression stockings because they hurt his legs and make swelling worse. Pt denies known family history of chronic, progressive limb swelling. )  PERTINENT HISTORY:  Hx Superficial ulcer of lower extremity  (CMS/HHS-HCC)  , OSA (not using CPAP), venous incompetence, negative hx of DVT, positive hx of recurrent LLE cellulitis; BMI 31.16 kg/m2   PAIN:  Are you having pain? Yes: NPRS scale: 7/10 Pain location: R knee Pain description: sharp, stabbing pain, discomfort Aggravating factors: standing, walking, weight bearing Relieving factors: getting off my feet, ice  PRECAUTIONS: Other: LYMPHEDEMA; immunosuppressed  FALLS:  Has patient fallen in last 6 months? No  LIVING ENVIRONMENT: Lives with: lives with their spouse Lives in: House/apartment Stairs: No;  Has following equipment at home: None  OCCUPATION: Machinist full time; work requires standing, lifting, pushing, pulling squatting, walking, dependent sitting  LEISURE: play 2 grandchildren  HAND DOMINANCE: right   PRIOR LEVEL OF FUNCTION: Independent  PATIENT GOALS: control leg swelling, reduce pain, keep swelling from progressing   OBJECTIVE: Note: Objective measures were completed at Evaluation unless otherwise noted.  COGNITION:  Overall cognitive status: Within functional limits for tasks assessed   POSTURE: WNL  LE ROM: limited at L knee and ankle by swelling  LE MMT: [facility]  LYMPHEDEMA ASSESSMENTS:   SURGERY TYPE/DATE: L great saphenous vein ablation 6/25  NUMBER OF LYMPH NODES REMOVED: NA  INFECTIONS: 2025 s/p systemic infection  originating from chronic leg wound resulted in sepsis; hx recurrent cellulitis  HX VENOUS ULCER/s: positive   LYMPHEDEMA ASSESSMENTS:   BLE COMPARATIVE LIMB VOLUMETRICS INITIAL 05/01/24  LANDMARK RIGHT  (dominant)  R LEG (A-D) 4417.2 ml  R THIGH (E-G) ml  R FULL LIMB (A-G) ml  Limb Volume differential (LVD)  %  Volume change since initial %  Volume change overall V  (Blank rows = not tested)  LANDMARK LEFT (RX)  L LEG (A-D) 4831.9 ml  L THIGH (E-G) ml  L FULL LIMB (A-G) ml  Limb Volume differential (LVD)  LVD measures 8.6%, L>R  Volume change since initial %  Volume  change overall %  (Blank rows = not tested)    SKIN CONDITION/ TISSUE INTEGRITY:  Skin  Description Hyper- Keratosis Peau d'  Orange Shiny Tight Fibrotic/ Indurated Fatty Doughy Spongy/ boggy   x   x L>R      Skin dry Flaky WNL  Macerated   x      Color Redness Varicosities Blanching Hemosiderin Stain Mottled   x x  x     Odor Malodorous Yeast Fungal infection  WNL      x   Temperature Warm Cool wnl    x     Pitting Edema   1+ 2+ 3+ 4+ Non-pitting         x   Girth Symmetrical Asymmetrical                   Distribution    L>R toes to groin    Stemmer Sign Positive Negative   Slight + bilaterally    Lymphorrhea History Of:  Present Absent   x  x    Wounds History Of Present Absent Venous Arterial Pressure Sheer   x   x       Signs of Infection Erythema Acute Swelling Drainage Defined Borders                Sensation Light Touch Deep pressure Hypersensitivity   In tact Impaired In tact Impaired Absent Impaired   x Tender x  x     Nails WNL   Fungus nail dystrophy   TBA     Hair Growth Symmetrical Asymmetrical   x    Skin Creases Base of toes  Ankles   Base of Fingers knees       Abdominal pannus Thigh Lobules  Face/neck   x x          GAIT: Distance walked: >500 ft Assistive device utilized: None Level of assistance: Complete Independence Comments: limp  LYMPHEDEMA LIFE IMPACT SCALE (LLIS): INITIAL 38.24%  PATIENT EDUCATION:  Continued Pt/ CG edu for lymphedema self care home program throughout session. Topics include outcome of comparative limb volumetrics- starting limb volume differentials (LVDs), technology and gradient techniques used for short stretch, multilayer compression wrapping, simple self-MLD, therapeutic lymphatic pumping exercises, skin/nail care, LE precautions, compression garment recommendations and specifications, wear and care schedule and compression garment donning / doffing w assistive devices. Discussed progress  towards all OT goals since commencing CDT. Discussed detrimental impact of obesity on lower and upper extremity lymphedema over time. Reviewed OT goals for lymphedema care with Pt and discussed progress to date.  All questions answered to the Pt's satisfaction. Good return. Person educated: Patient  Education method: Explanation, Demonstration, and Handouts Education comprehension: verbalized understanding, returned demonstration, verbal cues required, and needs further education   LYMPHEDEMA SELF-CARE HOME PROGRAM:  BLE lymphatic pumping there ex- 1 set of 10 each element, in order. Hold 5. 2 x daily 2. 23/7 hours daily, short stretch, knee length, multilayer compression bandages to one leg at a time only during Intensive Phase CDT During self-management phase of CDT fit w custom compression stockings for full time, daytime use Daily skin care with low ph lotion matching skin ph 5.  Daily simple self MLD. Consider fitting Pt with the advanced, T[employer] Flexitouch sequential, pneumatic , compression device for optimal lymphedema self care at home over time. [facility] research demonstrates that this device reduces infection risk of recurrent cellulitis.  ASSESSMENT:  CLINICAL IMPRESSION: Pt last seen ~ 1 month ago. Instead of weekly his OT visits for lymphedema care have been more like 1 month, to 2 weeks apart. Pt reports he is unable to attend OT more than 1 x weekly due to having many medical appointments and full-time employment. While this OT agrees that swelling and LE related  pain have reduced, signs and swelling of lymphedema persist, which include increased tissue density, increased girth at all landmarks below the knee, including foot with positive Stemmer sign. Pt is not using compression between visits consistently. He ha not performing skin care as directed, as evidenced by dry, flaking skin. We had a frank discussion about plan going forward with aim of jump starting progress, limiting  progression, and reducing infection risk.   Pt agrees with plan to move forward and fit custom, flat knit, ccl 2, Elvarex Classic, knee length, elastic compression garment with slant open toe, T heel  and silicone top band in an effort to limit leg swelling without wrapping, which is challenging with his work demands. Custom-made gradient compression garments and HOS devices are medically necessary because they are uniquely sized and shaped to fit the exact dimensions of the affected extremities, and to provide appropriate medical grade, graduated compression essential for optimally managing chronic, progressive lymphedema. Multiple custom compression garments are needed to ensure proper hygiene to limit infection risk. Custom compression garments should be replaced q 3-6 months When worn consistently for optimal lymphedema self-management over time. HOS devices, medically necessary to limit fibrosis buildup in tissue, should be replaced q 2 years and PRN when worn out.     Continued Pt education throughout session contiguous with MLD. Pt tolerated manual therapy without increased pain. No compression wraps today as Pt did not being them to clinic. Cont as per POC. Complete custom garment measurements next session.   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 Hs has a hx of slow healing wounds and recurrent cellulitis and lymphorrhea. Lymphedema limits his functional performance in all occupational domains, including basic and instrumental ADLs ( functional ambulation and mobility, LB dressing), productive activities (work, caring for others), leisure pursuits, social participation and quality of life. Mr. Tuite will benefit from skilled Occupational Therapy for Complete Decongestive Therapy, (CDT), the gold standard protocol for lymphedema care. CDT includes manual lymphatic drainage, therapeutic lymphatic pumping exercise, skin  care and compression therapies. The goals of CDT are to reduce limb swelling and associated pain,  to reduce infection risk and to limit lymphedema progression. Emphasis of Occupational Therapy throughout treatment course is on Pt education and self-care training for long term self management at home with OT support PRN. Without  OT for CDT lymphedema physical and sensory symptoms will progress and further functional decline is expected.)   OBJECTIVE IMPAIRMENTS: Abnormal gait, decreased activity tolerance, decreased knowledge of condition, decreased knowledge of use of DME, decreased mobility, decreased ROM, increased edema, obesity, pain, and chromic limb swelling, increased infection risk.   ACTIVITY LIMITATIONS: Basic and instrumental ADLs  (including functional ambulation and mobility, lower body dressing and fitting preferred street shoes, driving, shopping), productive activities ( work duties, caring for others), leisure pursuits, social participation requiring prolonged standing, walking and/ or dependent sitting  PERSONAL FACTORS: Age, Behavior pattern, Fitness, Past/current experiences, occupational demands, Time since onset of injury/illness/exacerbation, and 1-2 co morbidities: CVI, recurrent cellulitis and hx of slow healing venous ulcers ,  are also affecting patient's functional outcome.   REHAB POTENTIAL: Good  EVALUATION COMPLEXITY: Moderate   GOALS: Goals reviewed with patient? Yes  SHORT TERM GOALS: Target date: 4th OT Rx visit  1. Pt will demonstrate understanding of lymphedema precautions and prevention strategies with modified independence using a printed reference to identify at least 5 precautions and discussing how s/he  may implement them into daily life to reduce risk of progression with extra time. Baseline:Max A Goal status: INITIAL  2.  Pt will be able to apply knee length, multilayer, thigh length, gradient, compression wraps to one leg at a time with modified  independence ( extra time) to decrease limb volume, to limit infection risk, and to limit lymphedema progression.  Baseline: Dependent Goal status: INITIAL  LONG TERM GOALS: Target date: 06/24/24  1.Given this patient's Intake score of TBA % on the Lymphedema Life Impact Scale (LLIS), patient will experience a reduction of at least 10 points in his perceived level of functional impairment resulting from lymphedema to improve functional performance and quality of life (QOL). Baseline: TBA % Goal status: INITIAL  2.  Pt will achieve at least a 10% volume reduction in BLE to return limbs to typical size and shape, to limit infection risk and LE progression, to decrease pain, to improve function. Baseline: Dependent Goal status: INITIAL  3.  Pt will obtain appropriate compression garments/devices and achieve modified independence (extra time + assistive devices) with donning/doffing to optimize limb volume reductions and limit LE progression over time. Baseline: Dependent Goal status: INITIAL  4. During Intensive phase CDT, with modified independence, Pt will achieve at least 85% compliance with all adapted lymphedema self-care home program components, including daily skin care, compression wraps and /or garments, simple self MLD and lymphatic pumping therex to habituate LE self care protocol  into ADLs for optimal LE self-management over time. Baseline: Dependent Goal status: INITIAL  PLAN:  OT FREQUENCY: 2x/week  OT DURATION: 12 weeks  PLANNED INTERVENTIONS: Complete Decongestive Therapy C(CDT) to one limb at a time , including manual lymphatic drainage (MLD), skin care to limit infection risk, multilayer, gradient compression bandaging (one lower extremity at a time) fit with custom, flat knit compression garments once limbs are reduced to sustain clinical gains, lymphatic pumping exercise, elevation when seated. 97110-Therapeutic exercises,  97530- Therapeutic activity,  97535- Self  Care,  97140- Manual therapy,  97016- Advanced, sequential pneumatic  compression device (Flexitouch),  Patient/Family education,  Taping,  Manual lymph drainage,  Scar mobilization,DME instructions, and  PLAN FOR NEXT SESSION:  Pt edu BLE comparative limb volumetrics   Zebedee Dec, MS, OTR/L, CLT-LANA 06/12/2024 11:23 AM

## 2024-06-19 ENCOUNTER — Ambulatory Visit: Admitting: Occupational Therapy

## 2024-06-19 ENCOUNTER — Encounter: Payer: Self-pay | Admitting: Occupational Therapy

## 2024-06-19 DIAGNOSIS — I890 Lymphedema, not elsewhere classified: Secondary | ICD-10-CM

## 2024-06-19 NOTE — Therapy (Signed)
 " OUTPATIENT OCCUPATIONAL THERAPY EVALUATION  BILATERAL LOWER EXTREMITY LYMPHEDEMA Patient Name: Benjamin Knight MRN: [id number] DOB:26-Oct-1965, 58 y.o., male Today's Date: 06/19/2024  END OF SESSION:   OT End of Session - 06/19/24 1126     Visit Number 5    Number of Visits 36    Date for Recertification  06/25/24    OT Start Time 1115    OT Stop Time 1200    OT Time Calculation (min) 45 min    Activity Tolerance Patient tolerated treatment well;No increased pain    Behavior During Therapy WFL for tasks assessed/performed            Past Medical History:  Diagnosis Date   Alcoholic cirrhosis (HCC)    Bipolar disorder (HCC)    Chronic back pain    Cirrhosis (HCC)    Depression    Esophageal varices (HCC)    Portal hypertensive gastropathy (HCC)    Past Surgical History:  Procedure Laterality Date   APPENDECTOMY     Patient Active Problem List   Diagnosis Date Noted   Acute metabolic encephalopathy 12/26/2022   Upper GI bleed 12/26/2022   Portal hypertensive gastropathy (HCC) 12/26/2022   History of celiac disease 12/26/2022   History of bipolar disorder 12/26/2022   Chronic alcohol use 12/26/2022   AKI (acute kidney injury) 12/26/2022   Hypokalemia 12/26/2022   Acute encephalopathy 12/26/2022   Coffee ground emesis 12/26/2022   Heme positive stool 12/26/2022   Iron  deficiency anemia 05/10/2021   Seasonal allergic rhinitis 05/03/2021   Moderate recurrent major depression (HCC) 05/03/2021   Lactose intolerance 05/03/2021   History of alcohol abuse 05/03/2021   Gastritis 05/03/2021   Decompensated hepatic cirrhosis (HCC) 05/03/2021   Chronic diarrhea 05/03/2021   Celiac disease 05/03/2021   Blood in feces 05/03/2021   Ascites 05/03/2021   Anxiety 05/03/2021   Alcoholic cirrhosis (HCC) 05/03/2021   Monoclonal gammopathy 05/03/2021   Hepatic encephalopathy (HCC) 04/01/2021   AMS (altered mental status) 03/30/2021   Genetic testing 09/23/2020    Obstructive sleep apnea 12/03/2007    PCP: PCP recently left practice. New assignment pending  REFERRING PROVIDER: Zachary Gaskins, MD  REFERRING DIAG: I89.0  THERAPY DIAG:  Lymphedema, not elsewhere classified  Rationale for Evaluation and Treatment: Rehabilitation  ONSET DATE: Onset in 05/2022 with leg ulcer. R leg swells a little with standing 10 hour days. No known family hx of limb swelling  SUBJECTIVE:  SUBJECTIVE STATEMENT: Benjamin Knight presents to Occupational Therapy for CDT to BLE lymphedema. Instead of weekly his OT visits for lymphedema care have been more like 1 month, to 2 weeks apart. He was hospitalized on 12/5 for acute kidney injury after becoming hypovolemic after an episode of stomach bug . Pt reports his R meniscus seems to be doing much better. He feels leg swelling fluctuates. He did not being wraps to session. He reports he wrapped the L leg a couple of times during the interval and swelling is reduced. He reports L pain and sensation of heaviness is resolved. Pt denies LE related leg pain.  03/27/24 INITIAL OT EVAL: Benjamin Knight is referred to Occupational Therapy by Benjamin Farm, MD, for evaluation and treatment of BLE/BLQ lymphedema. Pt has not previously undergone lymphedema therapy before. He does not wear compression stockings because they hurt his legs and make swelling worse. Pt denies known family history of chronic, progressive limb swelling. )  PERTINENT HISTORY:  Hx Superficial ulcer of lower extremity (CMS/HHS-HCC)  , OSA (not using CPAP), venous incompetence, negative hx of DVT, positive hx of recurrent LLE cellulitis; BMI 31.16 kg/m2   PAIN:  Are you having pain? Yes: NPRS scale: 7/10 Pain location: R knee Pain description: sharp, stabbing pain,  discomfort Aggravating factors: standing, walking, weight bearing Relieving factors: getting off my feet, ice  PRECAUTIONS: Other: LYMPHEDEMA; immunosuppressed  FALLS:  Has patient fallen in last 6 months? No  LIVING ENVIRONMENT: Lives with: lives with their spouse Lives in: House/apartment Stairs: No;  Has following equipment at home: None  OCCUPATION: Machinist full time; work requires standing, lifting, pushing, pulling squatting, walking, dependent sitting  LEISURE: play 2 grandchildren  HAND DOMINANCE: right   PRIOR LEVEL OF FUNCTION: Independent  PATIENT GOALS: control leg swelling, reduce pain, keep swelling from progressing   OBJECTIVE: Note: Objective measures were completed at Evaluation unless otherwise noted.  COGNITION:  Overall cognitive status: Within functional limits for tasks assessed   POSTURE: WNL  LE ROM: limited at L knee and ankle by swelling  LE MMT: [facility]  LYMPHEDEMA ASSESSMENTS:   SURGERY TYPE/DATE: L great saphenous vein ablation 6/25  NUMBER OF LYMPH NODES REMOVED: NA  INFECTIONS: 2025 s/p systemic infection  originating from chronic leg wound resulted in sepsis; hx recurrent cellulitis  HX VENOUS ULCER/s: positive   LYMPHEDEMA ASSESSMENTS:   BLE COMPARATIVE LIMB VOLUMETRICS INITIAL 05/01/24  LANDMARK RIGHT  (dominant)  R LEG (A-D) 4417.2 ml  R THIGH (E-G) ml  R FULL LIMB (A-G) ml  Limb Volume differential (LVD)  %  Volume change since initial %  Volume change overall V  (Blank rows = not tested)  LANDMARK LEFT (RX)  L LEG (A-D) 4831.9 ml  L THIGH (E-G) ml  L FULL LIMB (A-G) ml  Limb Volume differential (LVD)  LVD measures 8.6%, L>R  Volume change since initial %  Volume change overall %  (Blank rows = not tested)    SKIN CONDITION/ TISSUE INTEGRITY:  Skin  Description Hyper- Keratosis Peau d'  Orange Shiny Tight Fibrotic/ Indurated Fatty Doughy Spongy/ boggy   x   x L>R      Skin dry Flaky WNL Macerated   x       Color Redness Varicosities Blanching Hemosiderin Stain Mottled   x x  x     Odor Malodorous Yeast Fungal infection  WNL      x   Temperature Warm Cool wnl    x  Pitting Edema   1+ 2+ 3+ 4+ Non-pitting         x   Girth Symmetrical Asymmetrical                   Distribution    L>R toes to groin    Stemmer Sign Positive Negative   Slight + bilaterally    Lymphorrhea History Of:  Present Absent   x  x    Wounds History Of Present Absent Venous Arterial Pressure Sheer   x   x       Signs of Infection Erythema Acute Swelling Drainage Defined Borders                Sensation Light Touch Deep pressure Hypersensitivity   In tact Impaired In tact Impaired Absent Impaired   x Tender x  x     Nails WNL   Fungus nail dystrophy   TBA     Hair Growth Symmetrical Asymmetrical   x    Skin Creases Base of toes  Ankles   Base of Fingers knees       Abdominal pannus Thigh Lobules  Face/neck   x x          GAIT: Distance walked: >500 ft Assistive device utilized: None Level of assistance: Complete Independence Comments: limp  LYMPHEDEMA LIFE IMPACT SCALE (LLIS): INITIAL 38.24%  PATIENT EDUCATION:  Continued Pt/ CG edu for lymphedema self care home program throughout session. Topics include outcome of comparative limb volumetrics- starting limb volume differentials (LVDs), technology and gradient techniques used for short stretch, multilayer compression wrapping, simple self-MLD, therapeutic lymphatic pumping exercises, skin/nail care, LE precautions, compression garment recommendations and specifications, wear and care schedule and compression garment donning / doffing w assistive devices. Discussed progress towards all OT goals since commencing CDT. Discussed detrimental impact of obesity on lower and upper extremity lymphedema over time. Reviewed OT goals for lymphedema care with Pt and discussed progress to date.  All questions answered to the Pt's  satisfaction. Good return. Person educated: Patient  Education method: Explanation, Demonstration, and Handouts Education comprehension: verbalized understanding, returned demonstration, verbal cues required, and needs further education   LYMPHEDEMA SELF-CARE HOME PROGRAM:  BLE lymphatic pumping there ex- 1 set of 10 each element, in order. Hold 5. 2 x daily 2. 23/7 hours daily, short stretch, knee length, multilayer compression bandages to one leg at a time only during Intensive Phase CDT During self-management phase of CDT fit w custom compression stockings for full time, daytime use Daily skin care with low ph lotion matching skin ph 5.  Daily simple self MLD. Consider fitting Pt with the advanced, [employer] Flexitouch sequential, pneumatic , compression device for optimal lymphedema self care at home over time. [facility] research demonstrates that this device reduces infection risk of recurrent cellulitis.  ASSESSMENT:  CLINICAL IMPRESSION: Completed anatomical measurements for medical grade, gradient, custom , flat knit compression knee highs for BLE, and submitted paperwork to DME vendor after session.   Pt agrees with plan to move forward and fit custom, flat knit, ccl 2, Elvarex Classic, knee length, elastic compression garment with slant open toe, T heel  and silicone top band in an effort to limit leg swelling without wrapping, which is challenging with his work demands. Custom-made gradient compression garments and HOS devices are medically necessary because they are uniquely sized and shaped to fit the exact dimensions of the affected extremities, and to provide appropriate medical grade, graduated compression essential for  optimally managing chronic, progressive lymphedema. Multiple custom compression garments are needed to ensure proper hygiene to limit infection risk. Custom compression garments should be replaced q 3-6 months When worn consistently for optimal lymphedema  self-management over time. HOS devices, medically necessary to limit fibrosis buildup in tissue, should be replaced q 2 years and PRN when worn out.     Continued Pt education throughout session. No compression wraps today as Pt did not being them to clinic. Remaining OT visits cancelled. Pt will call to schedule fitting when garments are delivered. Pt understands vendor will call him with any OOP costs.   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 Hs has a hx of slow healing wounds and recurrent cellulitis and lymphorrhea. Lymphedema limits his functional performance in all occupational domains, including basic and instrumental ADLs ( functional ambulation and mobility, LB dressing), productive activities (work, caring for others), leisure pursuits, social participation and quality of life. Benjamin. Staffa will benefit from skilled Occupational Therapy for Complete Decongestive Therapy, (CDT), the gold standard protocol for lymphedema care. CDT includes manual lymphatic drainage, therapeutic lymphatic pumping exercise, skin care and compression therapies. The goals of CDT are to reduce limb swelling and associated pain,  to reduce infection risk and to limit lymphedema progression. Emphasis of Occupational Therapy throughout treatment course is on Pt education and self-care training for long term self management at home with OT support PRN. Without  OT for CDT lymphedema physical and sensory symptoms will progress and further functional decline is expected.)   OBJECTIVE IMPAIRMENTS: Abnormal gait, decreased activity tolerance, decreased knowledge of condition, decreased knowledge of use of DME, decreased mobility, decreased ROM, increased edema, obesity, pain, and chromic limb swelling, increased infection risk.   ACTIVITY LIMITATIONS: Basic and instrumental ADLs  (including functional ambulation and mobility, lower body dressing and  fitting preferred street shoes, driving, shopping), productive activities ( work duties, caring for others), leisure pursuits, social participation requiring prolonged standing, walking and/ or dependent sitting  PERSONAL FACTORS: Age, Behavior pattern, Fitness, Past/current experiences, occupational demands, Time since onset of injury/illness/exacerbation, and 1-2 co morbidities: CVI, recurrent cellulitis and hx of slow healing venous ulcers ,  are also affecting patient's functional outcome.   REHAB POTENTIAL: Good  EVALUATION COMPLEXITY: Moderate   GOALS: Goals reviewed with patient? Yes  SHORT TERM GOALS: Target date: 4th OT Rx visit  1. Pt will demonstrate understanding of lymphedema precautions and prevention strategies with modified independence using a printed reference to identify at least 5 precautions and discussing how s/he may implement them into daily life to reduce risk of progression with extra time. Baseline:Max A Goal status: INITIAL  2.  Pt will be able to apply knee length, multilayer, thigh length, gradient, compression wraps to one leg at a time with modified independence ( extra time) to decrease limb volume, to limit infection risk, and to limit lymphedema progression.  Baseline: Dependent Goal status: INITIAL  LONG TERM GOALS: Target date: 06/24/24  1.Given this patient's Intake score of TBA % on the Lymphedema Life Impact Scale (LLIS), patient will experience a reduction of at least 10 points in his perceived level of functional impairment resulting from lymphedema to improve functional performance and quality of life (QOL). Baseline: TBA % Goal status: INITIAL  2.  Pt will achieve at least a 10% volume reduction in BLE to return limbs to typical size and shape, to limit infection risk and LE  progression, to decrease pain, to improve function. Baseline: Dependent Goal status: INITIAL  3.  Pt will obtain appropriate compression garments/devices and achieve  modified independence (extra time + assistive devices) with donning/doffing to optimize limb volume reductions and limit LE progression over time. Baseline: Dependent Goal status: INITIAL  4. During Intensive phase CDT, with modified independence, Pt will achieve at least 85% compliance with all adapted lymphedema self-care home program components, including daily skin care, compression wraps and /or garments, simple self MLD and lymphatic pumping therex to habituate LE self care protocol  into ADLs for optimal LE self-management over time. Baseline: Dependent Goal status: INITIAL  PLAN:  OT FREQUENCY: 2x/week  OT DURATION: 12 weeks  PLANNED INTERVENTIONS: Complete Decongestive Therapy C(CDT) to one limb at a time , including manual lymphatic drainage (MLD), skin care to limit infection risk, multilayer, gradient compression bandaging (one lower extremity at a time) fit with custom, flat knit compression garments once limbs are reduced to sustain clinical gains, lymphatic pumping exercise, elevation when seated. 97110-Therapeutic exercises,  97530- Therapeutic activity,  97535- Self Care,  97140- Manual therapy,  97016- Advanced, sequential pneumatic  compression device (Flexitouch),  Patient/Family education,  Taping,  Manual lymph drainage,  Scar mobilization,DME instructions, and  PLAN FOR NEXT SESSION:  Pt edu BLE comparative limb volumetrics   Zebedee Dec, MS, OTR/L, CLT-LANA 06/19/2024 12:08 PM  "

## 2024-06-20 DIAGNOSIS — R051 Acute cough: Secondary | ICD-10-CM | POA: Diagnosis not present

## 2024-06-20 DIAGNOSIS — R0981 Nasal congestion: Secondary | ICD-10-CM | POA: Diagnosis not present

## 2024-06-20 DIAGNOSIS — R509 Fever, unspecified: Secondary | ICD-10-CM | POA: Diagnosis not present

## 2024-06-20 DIAGNOSIS — J101 Influenza due to other identified influenza virus with other respiratory manifestations: Secondary | ICD-10-CM | POA: Diagnosis not present

## 2024-06-22 DIAGNOSIS — I890 Lymphedema, not elsewhere classified: Secondary | ICD-10-CM | POA: Diagnosis not present

## 2024-06-22 DIAGNOSIS — L97909 Non-pressure chronic ulcer of unspecified part of unspecified lower leg with unspecified severity: Secondary | ICD-10-CM | POA: Diagnosis not present

## 2024-06-22 DIAGNOSIS — I872 Venous insufficiency (chronic) (peripheral): Secondary | ICD-10-CM | POA: Diagnosis not present

## 2024-06-26 DIAGNOSIS — Z944 Liver transplant status: Secondary | ICD-10-CM | POA: Diagnosis not present

## 2024-06-26 DIAGNOSIS — B259 Cytomegaloviral disease, unspecified: Secondary | ICD-10-CM | POA: Diagnosis not present

## 2024-06-26 DIAGNOSIS — D849 Immunodeficiency, unspecified: Secondary | ICD-10-CM | POA: Diagnosis not present

## 2024-06-30 DIAGNOSIS — K7460 Unspecified cirrhosis of liver: Secondary | ICD-10-CM | POA: Diagnosis not present

## 2024-06-30 DIAGNOSIS — Z23 Encounter for immunization: Secondary | ICD-10-CM | POA: Diagnosis not present

## 2024-06-30 DIAGNOSIS — Z0000 Encounter for general adult medical examination without abnormal findings: Secondary | ICD-10-CM | POA: Diagnosis not present

## 2024-06-30 DIAGNOSIS — G4733 Obstructive sleep apnea (adult) (pediatric): Secondary | ICD-10-CM | POA: Diagnosis not present

## 2024-07-17 ENCOUNTER — Inpatient Hospital Stay: Attending: Hematology and Oncology

## 2024-07-17 ENCOUNTER — Other Ambulatory Visit: Payer: Self-pay | Admitting: Hematology and Oncology

## 2024-07-17 ENCOUNTER — Inpatient Hospital Stay: Admitting: Hematology and Oncology

## 2024-07-17 VITALS — BP 138/91 | HR 82 | Temp 97.2°F | Resp 16 | Wt 231.7 lb

## 2024-07-17 DIAGNOSIS — D509 Iron deficiency anemia, unspecified: Secondary | ICD-10-CM | POA: Insufficient documentation

## 2024-07-17 DIAGNOSIS — F17220 Nicotine dependence, chewing tobacco, uncomplicated: Secondary | ICD-10-CM | POA: Insufficient documentation

## 2024-07-17 DIAGNOSIS — D500 Iron deficiency anemia secondary to blood loss (chronic): Secondary | ICD-10-CM

## 2024-07-17 DIAGNOSIS — D472 Monoclonal gammopathy: Secondary | ICD-10-CM | POA: Insufficient documentation

## 2024-07-17 DIAGNOSIS — K7030 Alcoholic cirrhosis of liver without ascites: Secondary | ICD-10-CM | POA: Diagnosis not present

## 2024-07-17 LAB — IRON AND IRON BINDING CAPACITY (CC-WL,HP ONLY)
Iron: 60 ug/dL (ref 45–182)
Saturation Ratios: 18 % (ref 17.9–39.5)
TIBC: 333 ug/dL (ref 250–450)
UIBC: 273 ug/dL

## 2024-07-17 LAB — CBC WITH DIFFERENTIAL (CANCER CENTER ONLY)
Abs Immature Granulocytes: 0.09 K/uL — ABNORMAL HIGH (ref 0.00–0.07)
Basophils Absolute: 0.1 K/uL (ref 0.0–0.1)
Basophils Relative: 1 %
Eosinophils Absolute: 0.1 K/uL (ref 0.0–0.5)
Eosinophils Relative: 1 %
HCT: 39.2 % (ref 39.0–52.0)
Hemoglobin: 12.8 g/dL — ABNORMAL LOW (ref 13.0–17.0)
Immature Granulocytes: 1 %
Lymphocytes Relative: 34 %
Lymphs Abs: 2.4 K/uL (ref 0.7–4.0)
MCH: 30 pg (ref 26.0–34.0)
MCHC: 32.7 g/dL (ref 30.0–36.0)
MCV: 92 fL (ref 80.0–100.0)
Monocytes Absolute: 0.8 K/uL (ref 0.1–1.0)
Monocytes Relative: 11 %
Neutro Abs: 3.7 K/uL (ref 1.7–7.7)
Neutrophils Relative %: 52 %
Platelet Count: 244 K/uL (ref 150–400)
RBC: 4.26 MIL/uL (ref 4.22–5.81)
RDW: 15.4 % (ref 11.5–15.5)
WBC Count: 7.1 K/uL (ref 4.0–10.5)
nRBC: 0 % (ref 0.0–0.2)

## 2024-07-17 LAB — CMP (CANCER CENTER ONLY)
ALT: 19 U/L (ref 0–44)
AST: 19 U/L (ref 15–41)
Albumin: 4.2 g/dL (ref 3.5–5.0)
Alkaline Phosphatase: 86 U/L (ref 38–126)
Anion gap: 12 (ref 5–15)
BUN: 22 mg/dL — ABNORMAL HIGH (ref 6–20)
CO2: 24 mmol/L (ref 22–32)
Calcium: 9.1 mg/dL (ref 8.9–10.3)
Chloride: 104 mmol/L (ref 98–111)
Creatinine: 1.51 mg/dL — ABNORMAL HIGH (ref 0.61–1.24)
GFR, Estimated: 53 mL/min — ABNORMAL LOW
Glucose, Bld: 148 mg/dL — ABNORMAL HIGH (ref 70–99)
Potassium: 4.4 mmol/L (ref 3.5–5.1)
Sodium: 140 mmol/L (ref 135–145)
Total Bilirubin: 0.4 mg/dL (ref 0.0–1.2)
Total Protein: 6.8 g/dL (ref 6.5–8.1)

## 2024-07-17 LAB — RETIC PANEL
Immature Retic Fract: 26.9 % — ABNORMAL HIGH (ref 2.3–15.9)
RBC.: 4.27 MIL/uL (ref 4.22–5.81)
Retic Count, Absolute: 102.1 K/uL (ref 19.0–186.0)
Retic Ct Pct: 2.4 % (ref 0.4–3.1)
Reticulocyte Hemoglobin: 34.6 pg

## 2024-07-17 LAB — FERRITIN: Ferritin: 54 ng/mL (ref 24–336)

## 2024-07-17 NOTE — Progress Notes (Signed)
 " [facility] [phone]   [phone]  PROGRESS NOTE  Patient Care Team: Alys Schuyler HERO, PA as PCP - General (Internal Medicine)  Hematological/Oncological History # IgG Kappa Monoclonal Gammopathy of Undetermined Significance # Iron  Deficiency Anemia  1) 03/31/2021:  -SPEP detected M-protein 1.2 g/dL. Serum free light chains showed kappa light chain 109.8 mg/L, lambda light chains 35.1 mg/L and elevated ratio of 3.13.  -Vitamin B12 level 1,095, Ferritin 27, Iron  72, TIBC 514 (H), Iron  saturation 14% (L), folate 12.8.   2) 04/01/2021: WBC 10.6 (H), Hgb 10.5 (L), MCV 82.7, Plt 228, Creatinine 1.15, Calcium  8.5 (L).    3) 04/21/2021: Establish care with [facility] Hematology/Oncology  Interval History:  Benjamin Knight 59 y.o. male with medical history significant for MGUS and iron  deficiency anemia who presents for a follow up visit. The patient's last visit was on 01/16/2024.  In the interim since his last visit he has had no major changes in his health.  On exam today Mr. Slaven reports he has been well overall since our last visit 6 months ago.  He reports his energy and appetite are good.  He also reports that he is trying to lose weight and was told to target 10% of his body weight.  He reports that he has not been able to do this because it is been too cold outside and it is challenging to exercise.  He reports that he is not currently taking iron  pills as he was told to discontinue these.  He is not having any issues with lightheadedness, dizziness, shortness of breath.  He denies any pain.  He is undergoing lymphatic therapy.  To help with the swelling in his legs and reports has been helping tremendously.  He denies any runny nose, sore throat, cough.  He said no issues with fevers, chills, sweats.  Overall he feels quite well and has no additional questions concerns or complaints today.  A full 10 point ROS is otherwise negative.  MEDICAL HISTORY:   Past Medical History:  Diagnosis Date   Alcoholic cirrhosis (HCC)    Bipolar disorder (HCC)    Chronic back pain    Cirrhosis (HCC)    Depression    Esophageal varices (HCC)    Portal hypertensive gastropathy (HCC)     SURGICAL HISTORY: Past Surgical History:  Procedure Laterality Date   APPENDECTOMY      SOCIAL HISTORY: Social History   Socioeconomic History   Marital status: Married    Spouse name: Not on file   Number of children: Not on file   Years of education: Not on file   Highest education level: Not on file  Occupational History   Not on file  Tobacco Use   Smoking status: Never   Smokeless tobacco: Current    Types: Chew  Substance and Sexual Activity   Alcohol use: Yes    Comment: Per wife, he has an extensive history of alcohol use with prior attempts of quitting. However, recently noted to be drinking up to 1/5 liquor on weekends and half of 1/5 liquor daily on weekdays until Labor day weekend.   Drug use: Never   Sexual activity: Not on file  Other Topics Concern   Not on file  Social History Narrative   Not on file   Social Drivers of Health   Tobacco Use: Medium Risk (06/22/2024)   Received from [facility]   Patient History    Smoking Tobacco Use: Never  Smokeless Tobacco Use: Former    Passive Exposure: Not on Actuary Strain: Low Risk  (06/05/2024)   Received from [facility]   Overall Financial Resource Strain (CARDIA)    Difficulty of Paying Living Expenses: Not hard at all  Food Insecurity: No Food Insecurity (06/05/2024)   Received from [facility]   Epic    Within the past 12 months, you worried that your food would run out before you got the money to buy more.: Never true    Within the past 12 months, the food you bought just didn't last and you didn't have money to get more.: Never true  Transportation Needs: No Transportation Needs (06/05/2024)   Received from  [facility] - Transportation    In the past 12 months, has lack of transportation kept you from medical appointments or from getting medications?: No    Lack of Transportation (Non-Medical): No  Physical Activity: Not on file  Stress: Not on file  Social Connections: Not on file  Intimate Partner Violence: Not on file  Depression (EYV7-0): Not on file  Alcohol Screen: Not on file  Housing: Low Risk  (06/05/2024)   Received from [facility]   Epic    In the last 12 months, was there a time when you were not able to pay the mortgage or rent on time?: No    In the past 12 months, how many times have you moved where you were living?: 0    At any time in the past 12 months, were you homeless or living in a shelter (including now)?: No  Utilities: Not At Risk (06/05/2024)   Received from [facility]   Epic    In the past 12 months has the electric, gas, oil, or water  company threatened to shut off services in your home?: No  Health Literacy: Not on file    FAMILY HISTORY: Family History  Problem Relation Age of Onset   Heart disease Mother     ALLERGIES:  is allergic to barley grass, other, sertraline, and wheat.  MEDICATIONS:  Current Outpatient Medications  Medication Sig Dispense Refill   acetaminophen  (TYLENOL ) 325 MG tablet Take by mouth.     Calcium  Citrate-Vitamin D 315-5 MG-MCG TABS Take by mouth.     mycophenolate (CELLCEPT) 250 MG capsule Take 1,000 mg by mouth 2 (two) times daily. (Patient taking differently: Take 750 mg by mouth 2 (two) times daily.)     Specialty Vitamins Products (MG PLUS PROTEIN) 133 MG TABS Take by mouth.     tacrolimus  (PROGRAF ) 0.5 MG capsule Take 0.5 mg by mouth 2 (two) times daily.     valGANciclovir (VALCYTE) 450 MG tablet Take 900 mg by mouth daily.     aspirin EC 81 MG tablet Take 81 mg by mouth daily. (Patient not taking: Reported on 07/17/2024)     melatonin 3 MG TABS tablet Take  3 mg by mouth at bedtime as needed. (Patient not taking: Reported on 07/17/2024)     sulfamethoxazole-trimethoprim (BACTRIM DS) 800-160 MG tablet Take 1 tablet by mouth 2 (two) times daily. (Patient not taking: Reported on 07/17/2024)     traZODone (DESYREL) 50 MG tablet Take 1 tablet by mouth at bedtime. (Patient not taking: Reported on 07/17/2024)     triamcinolone  cream (KENALOG ) 0.1 % Apply topically. (Patient not taking: Reported on 07/17/2024)     No current facility-administered medications for  this visit.    REVIEW OF SYSTEMS:   Constitutional: ( - ) fevers, ( - )  chills , ( - ) night sweats Eyes: ( - ) blurriness of vision, ( - ) double vision, ( - ) watery eyes Ears, nose, mouth, throat, and face: ( - ) mucositis, ( - ) sore throat Respiratory: ( - ) cough, ( - ) dyspnea, ( - ) wheezes Cardiovascular: ( - ) palpitation, ( - ) chest discomfort, ( - ) lower extremity swelling Gastrointestinal:  ( - ) nausea, ( - ) heartburn, ( - ) change in bowel habits Skin: ( - ) abnormal skin rashes Lymphatics: ( - ) new lymphadenopathy, ( - ) easy bruising Neurological: ( - ) numbness, ( - ) tingling, ( - ) new weaknesses Behavioral/Psych: ( - ) mood change, ( - ) new changes  All other systems were reviewed with the patient and are negative.  PHYSICAL EXAMINATION: ECOG PERFORMANCE STATUS: 1 - Symptomatic but completely ambulatory  Vitals:   07/17/24 1055  BP: (!) 138/91  Pulse: 82  Resp: 16  Temp: (!) 97.2 F (36.2 C)  SpO2: 99%        Filed Weights   07/17/24 1055  Weight: 231 lb 11.2 oz (105.1 kg)         GENERAL: Well-appearing middle-age Caucasian male, alert, no distress and comfortable SKIN: skin color, texture, turgor are normal, no rashes or significant lesions EYES: conjunctiva are pink and non-injected, sclera clear LUNGS: clear to auscultation and percussion with normal breathing effort HEART: regular rate & rhythm and no murmurs and no lower extremity  edema Musculoskeletal: no cyanosis of digits and no clubbing  PSYCH: alert & oriented x 3, fluent speech NEURO: no focal motor/sensory deficits  LABORATORY DATA:  I have reviewed the data as listed    Latest Ref Rng & Units 07/17/2024   10:14 AM 01/16/2024    8:07 AM 06/27/2023    9:30 AM  CBC  WBC 4.0 - 10.5 K/uL 7.1  6.6  3.9   Hemoglobin 13.0 - 17.0 g/dL 87.1  88.2  88.7   Hematocrit 39.0 - 52.0 % 39.2  36.2  34.2   Platelets 150 - 400 K/uL 244  279  324        Latest Ref Rng & Units 07/17/2024   10:14 AM 01/16/2024    8:07 AM 06/27/2023    9:30 AM  CMP  Glucose 70 - 99 mg/dL 851  839  868   BUN 6 - 20 mg/dL 22  23  28    Creatinine 0.61 - 1.24 mg/dL 8.48  8.57  8.09   Sodium 135 - 145 mmol/L 140  139  135   Potassium 3.5 - 5.1 mmol/L 4.4  4.4  4.6   Chloride 98 - 111 mmol/L 104  107  108   CO2 22 - 32 mmol/L 24  25  21    Calcium  8.9 - 10.3 mg/dL 9.1  8.8  9.3   Total Protein 6.5 - 8.1 g/dL 6.8  6.7  6.9   Total Bilirubin 0.0 - 1.2 mg/dL 0.4  0.3  0.3   Alkaline Phos 38 - 126 U/L 86  107  84   AST 15 - 41 U/L 19  24  16    ALT 0 - 44 U/L 19  33  11     Lab Results  Component Value Date   MPROTEIN 0.4 (H) 01/16/2024   MPROTEIN 0.3 (H) 06/27/2023  MPROTEIN 0.8 (H) 08/30/2022   Lab Results  Component Value Date   KPAFRELGTCHN 34.8 (H) 01/16/2024   KPAFRELGTCHN 20.3 (H) 06/27/2023   KPAFRELGTCHN 87.3 (H) 08/30/2022   LAMBDASER 20.7 01/16/2024   LAMBDASER 7.9 06/27/2023   LAMBDASER 30.7 (H) 08/30/2022   KAPLAMBRATIO 1.68 (H) 01/16/2024   KAPLAMBRATIO 2.57 (H) 06/27/2023   KAPLAMBRATIO 2.84 (H) 08/30/2022    RADIOGRAPHIC STUDIES: No results found.   ASSESSMENT & PLAN Benjamin Knight 59 y.o. male with medical history significant for MGUS and iron  deficiency anemia who presents for a follow up visit.   #Monoclonal gammopathy of Uncertain Significance: --repeat CBC, CMP, SPEP/IFE,  kappa/lambda light chain q 6 months with UPEP and met survey yearly.   --DG  bone met survey showed no clear lytic lesions.  --bone marrow biopsy performed due to elevated serum free light chain ratio. Performed on 05/04/2021, showed 4% plasma cells. Did not meet criteria for Multiple myeloma.   --M protein 0.4 with SFLC ratio at 1.68 at last check on 01/16/2024.  Labs pending from today. --RTC in 6 months   # Cirrhosis: --Diagnosed recently in September 2022 after presenting with AMS due to acute hepatic encephalopathy.  --Currently on Lactulose  TID. 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    #Iron  deficiency Anemia of Uncertain Etiology: --labs today show white blood cell 7.1, Hgb 12.8, MCV 92, Plt 244  -- continue ferrous sulfate  325 mg every other day  -- recieved IV iron  sucrose 200 mg q. 7 days x 5 doses from 05/19/21-06/27/2021. --EGD performed on 10/20/2021, showed small esophageal varices and portal gastropathy  --RTC in 6 months to assess iron  stores.    No orders of the defined types were placed in this encounter.  All questions were answered. The patient knows to call the clinic with any problems, questions or concerns.  A total of more than 30 minutes were spent on this encounter with face-to-face time and non-face-to-face time, including preparing to see the patient, ordering tests and/or medications, counseling the patient and coordination of care as outlined above.   Norleen IVAR Kidney, MD Department of Hematology/Oncology [facility] at [facility] Phone: [phone] Pager: [phone] Email: [email or web address]  07/19/2024 6:01 PM "

## 2024-07-20 LAB — KAPPA/LAMBDA LIGHT CHAINS
Kappa free light chain: 24.5 mg/L — ABNORMAL HIGH (ref 3.3–19.4)
Kappa, lambda light chain ratio: 1.91 — ABNORMAL HIGH (ref 0.26–1.65)
Lambda free light chains: 12.8 mg/L (ref 5.7–26.3)

## 2024-07-21 LAB — MULTIPLE MYELOMA PANEL, SERUM
Albumin SerPl Elph-Mcnc: 3.4 g/dL (ref 2.9–4.4)
Albumin/Glob SerPl: 1.2 (ref 0.7–1.7)
Alpha 1: 0.2 g/dL (ref 0.0–0.4)
Alpha2 Glob SerPl Elph-Mcnc: 0.8 g/dL (ref 0.4–1.0)
B-Globulin SerPl Elph-Mcnc: 1.1 g/dL (ref 0.7–1.3)
Gamma Glob SerPl Elph-Mcnc: 0.9 g/dL (ref 0.4–1.8)
Globulin, Total: 3 g/dL (ref 2.2–3.9)
IgA: 216 mg/dL (ref 90–386)
IgG (Immunoglobin G), Serum: 1010 mg/dL (ref 603–1613)
IgM (Immunoglobulin M), Srm: 39 mg/dL (ref 20–172)
M Protein SerPl Elph-Mcnc: 0.4 g/dL — ABNORMAL HIGH
Total Protein ELP: 6.4 g/dL (ref 6.0–8.5)

## 2024-07-24 ENCOUNTER — Ambulatory Visit: Admitting: Occupational Therapy

## 2024-07-31 ENCOUNTER — Ambulatory Visit: Attending: Vascular Surgery | Admitting: Occupational Therapy

## 2024-07-31 ENCOUNTER — Encounter: Payer: Self-pay | Admitting: Occupational Therapy

## 2024-07-31 DIAGNOSIS — I890 Lymphedema, not elsewhere classified: Secondary | ICD-10-CM | POA: Insufficient documentation

## 2024-07-31 NOTE — Addendum Note (Signed)
 Addended by: MYRIAM ZEBEDEE CROME on: 07/31/2024 12:27 PM   Modules accepted: Orders

## 2024-08-07 ENCOUNTER — Ambulatory Visit: Admitting: Occupational Therapy

## 2024-08-14 ENCOUNTER — Ambulatory Visit: Attending: Vascular Surgery | Admitting: Occupational Therapy

## 2024-08-21 ENCOUNTER — Ambulatory Visit: Admitting: Occupational Therapy

## 2024-08-28 ENCOUNTER — Ambulatory Visit: Admitting: Occupational Therapy

## 2024-09-04 ENCOUNTER — Ambulatory Visit: Attending: Vascular Surgery | Admitting: Occupational Therapy

## 2024-09-11 ENCOUNTER — Ambulatory Visit: Admitting: Occupational Therapy

## 2024-09-18 ENCOUNTER — Ambulatory Visit: Admitting: Occupational Therapy

## 2024-09-25 ENCOUNTER — Ambulatory Visit: Admitting: Occupational Therapy

## 2024-10-02 ENCOUNTER — Ambulatory Visit: Attending: Vascular Surgery | Admitting: Occupational Therapy

## 2024-10-09 ENCOUNTER — Ambulatory Visit: Admitting: Occupational Therapy

## 2024-10-16 ENCOUNTER — Ambulatory Visit: Admitting: Occupational Therapy

## 2024-10-23 ENCOUNTER — Ambulatory Visit: Admitting: Occupational Therapy

## 2024-10-30 ENCOUNTER — Ambulatory Visit: Attending: Vascular Surgery | Admitting: Occupational Therapy

## 2024-11-06 ENCOUNTER — Ambulatory Visit: Admitting: Occupational Therapy

## 2024-11-13 ENCOUNTER — Ambulatory Visit: Admitting: Occupational Therapy

## 2024-11-20 ENCOUNTER — Ambulatory Visit: Admitting: Occupational Therapy

## 2024-11-27 ENCOUNTER — Ambulatory Visit: Admitting: Occupational Therapy

## 2024-12-04 ENCOUNTER — Ambulatory Visit: Attending: Vascular Surgery | Admitting: Occupational Therapy

## 2024-12-11 ENCOUNTER — Ambulatory Visit: Admitting: Occupational Therapy

## 2024-12-18 ENCOUNTER — Ambulatory Visit: Admitting: Occupational Therapy

## 2024-12-25 ENCOUNTER — Ambulatory Visit: Admitting: Occupational Therapy

## 2025-01-08 ENCOUNTER — Inpatient Hospital Stay: Admitting: Hematology and Oncology

## 2025-01-08 ENCOUNTER — Ambulatory Visit: Attending: Vascular Surgery | Admitting: Occupational Therapy

## 2025-01-08 ENCOUNTER — Inpatient Hospital Stay

## 2025-01-15 ENCOUNTER — Ambulatory Visit: Admitting: Occupational Therapy

## 2025-01-22 ENCOUNTER — Ambulatory Visit: Admitting: Occupational Therapy

## 2025-01-29 ENCOUNTER — Ambulatory Visit: Admitting: Occupational Therapy

## 2025-02-05 ENCOUNTER — Ambulatory Visit: Attending: Vascular Surgery | Admitting: Occupational Therapy

## 2025-02-12 ENCOUNTER — Ambulatory Visit: Admitting: Occupational Therapy

## 2025-02-19 ENCOUNTER — Ambulatory Visit: Admitting: Occupational Therapy

## 2025-02-26 ENCOUNTER — Ambulatory Visit: Admitting: Occupational Therapy

## 2025-03-05 ENCOUNTER — Ambulatory Visit: Attending: Vascular Surgery | Admitting: Occupational Therapy

## 2025-03-12 ENCOUNTER — Ambulatory Visit: Admitting: Occupational Therapy

## 2025-03-19 ENCOUNTER — Ambulatory Visit: Admitting: Occupational Therapy

## 2025-03-26 ENCOUNTER — Ambulatory Visit: Admitting: Occupational Therapy

## 2025-04-02 ENCOUNTER — Ambulatory Visit: Attending: Vascular Surgery | Admitting: Occupational Therapy

## 2025-07-23 ENCOUNTER — Inpatient Hospital Stay: Admitting: Hematology and Oncology

## 2025-07-23 ENCOUNTER — Inpatient Hospital Stay
